# Patient Record
Sex: Male | Born: 1955
Health system: Southern US, Community
[De-identification: ages and names within clinical notes are randomized; demographics above are authoritative.]

## PROBLEM LIST (undated history)

## (undated) DIAGNOSIS — E785 Hyperlipidemia, unspecified: Secondary | ICD-10-CM

## (undated) DIAGNOSIS — T7840XA Allergy, unspecified, initial encounter: Secondary | ICD-10-CM

## (undated) DIAGNOSIS — I2510 Atherosclerotic heart disease of native coronary artery without angina pectoris: Secondary | ICD-10-CM

## (undated) DIAGNOSIS — K219 Gastro-esophageal reflux disease without esophagitis: Secondary | ICD-10-CM

## (undated) DIAGNOSIS — I10 Essential (primary) hypertension: Secondary | ICD-10-CM

## (undated) DIAGNOSIS — F419 Anxiety disorder, unspecified: Secondary | ICD-10-CM

## (undated) DIAGNOSIS — J45909 Unspecified asthma, uncomplicated: Secondary | ICD-10-CM

## (undated) DIAGNOSIS — I519 Heart disease, unspecified: Secondary | ICD-10-CM

## (undated) DIAGNOSIS — M1990 Unspecified osteoarthritis, unspecified site: Secondary | ICD-10-CM

## (undated) HISTORY — PX: ANKLE FRACTURE SURGERY: SHX122

## (undated) HISTORY — DX: Atherosclerotic heart disease of native coronary artery without angina pectoris: I25.10

## (undated) HISTORY — DX: Gastro-esophageal reflux disease without esophagitis: K21.9

## (undated) HISTORY — DX: Heart disease, unspecified: I51.9

## (undated) HISTORY — DX: Essential (primary) hypertension: I10

## (undated) HISTORY — DX: Hyperlipidemia, unspecified: E78.5

## (undated) HISTORY — DX: Unspecified osteoarthritis, unspecified site: M19.90

## (undated) HISTORY — DX: Anxiety disorder, unspecified: F41.9

## (undated) HISTORY — DX: Unspecified asthma, uncomplicated: J45.909

## (undated) HISTORY — DX: Allergy, unspecified, initial encounter: T78.40XA

## (undated) HISTORY — PX: CORONARY STENT PLACEMENT: SHX1402

## (undated) HISTORY — PX: FRACTURE SURGERY: SHX138

---

## 1979-03-17 DIAGNOSIS — E782 Mixed hyperlipidemia: Secondary | ICD-10-CM | POA: Insufficient documentation

## 2006-10-02 HISTORY — PX: CARDIAC CATHETERIZATION: SHX172

## 2007-08-01 DIAGNOSIS — I2510 Atherosclerotic heart disease of native coronary artery without angina pectoris: Secondary | ICD-10-CM | POA: Insufficient documentation

## 2008-09-17 DIAGNOSIS — F32A Depression, unspecified: Secondary | ICD-10-CM | POA: Insufficient documentation

## 2009-10-02 HISTORY — PX: CARDIAC CATHETERIZATION: SHX172

## 2011-09-04 DIAGNOSIS — S270XXA Traumatic pneumothorax, initial encounter: Secondary | ICD-10-CM

## 2011-09-04 DIAGNOSIS — Z8781 Personal history of (healed) traumatic fracture: Secondary | ICD-10-CM | POA: Insufficient documentation

## 2011-09-04 HISTORY — DX: Traumatic pneumothorax, initial encounter: S27.0XXA

## 2012-10-24 DIAGNOSIS — Z8249 Family history of ischemic heart disease and other diseases of the circulatory system: Secondary | ICD-10-CM | POA: Insufficient documentation

## 2012-12-10 DIAGNOSIS — E7800 Pure hypercholesterolemia, unspecified: Secondary | ICD-10-CM | POA: Insufficient documentation

## 2015-03-22 ENCOUNTER — Ambulatory Visit (INDEPENDENT_AMBULATORY_CARE_PROVIDER_SITE_OTHER): Payer: BLUE CROSS/BLUE SHIELD | Admitting: Family Medicine

## 2015-03-22 ENCOUNTER — Encounter: Payer: Self-pay | Admitting: Family Medicine

## 2015-03-22 VITALS — BP 124/74 | HR 82 | Temp 98.2°F | Ht 70.0 in | Wt 190.4 lb

## 2015-03-22 DIAGNOSIS — J4540 Moderate persistent asthma, uncomplicated: Secondary | ICD-10-CM | POA: Diagnosis not present

## 2015-03-22 DIAGNOSIS — K219 Gastro-esophageal reflux disease without esophagitis: Secondary | ICD-10-CM | POA: Insufficient documentation

## 2015-03-22 DIAGNOSIS — E785 Hyperlipidemia, unspecified: Secondary | ICD-10-CM | POA: Insufficient documentation

## 2015-03-22 DIAGNOSIS — J45909 Unspecified asthma, uncomplicated: Secondary | ICD-10-CM | POA: Insufficient documentation

## 2015-03-22 DIAGNOSIS — F419 Anxiety disorder, unspecified: Secondary | ICD-10-CM | POA: Insufficient documentation

## 2015-03-22 MED ORDER — FLUTICASONE-SALMETEROL 100-50 MCG/DOSE IN AEPB: 1.0000 | INHALATION_SPRAY | Freq: Every day | RESPIRATORY_TRACT | Status: DC

## 2015-03-22 MED ORDER — RABEPRAZOLE SODIUM 20 MG PO TBEC: 20.0000 mg | DELAYED_RELEASE_TABLET | Freq: Every day | ORAL | Status: DC

## 2015-03-22 NOTE — Progress Notes (Signed)
Name: Paul Page   MRN: [id number]    DOB: 1956/06/13   Date:03/22/2015       Progress Note  Subjective  Chief Complaint  Chief Complaint  Patient presents with  . Follow-up    medications    Hyperlipidemia This is a chronic problem. The problem is uncontrolled. Recent lipid tests were reviewed and are high. Pertinent negatives include no chest pain, leg pain or myalgias. Current antihyperlipidemic treatment includes statins. There are no compliance problems.   Heartburn He complains of heartburn. He reports no abdominal pain, no belching, no chest pain, no choking, no dysphagia, no nausea or no sore throat. This is a chronic problem. The problem has been rapidly improving. The symptoms are aggravated by certain foods. He has tried a PPI for the symptoms. The treatment provided significant relief. Past procedures do not include an EGD.      Past Medical History  Diagnosis Date  . Anxiety   . Hypertension   . Hyperlipidemia   . GERD (gastroesophageal reflux disease)   . Heart disease     Past Surgical History  Procedure Laterality Date  . Ankle fracture surgery    . Coronary stent placement      Family History  Problem Relation Age of Onset  . Parkinson's disease Mother   . Cancer Father     History   Social History  . Marital Status: Married    Spouse Name: N/A  . Number of Children: N/A  . Years of Education: N/A   Occupational History  . Not on file.   Social History Main Topics  . Smoking status: Never Smoker   . Smokeless tobacco: Not on file  . Alcohol Use: 0.0 oz/week    0 Standard drinks or equivalent per week     Comment: occasional wine  . Drug Use: No  . Sexual Activity: Not on file   Other Topics Concern  . Not on file   Social History Narrative  . No narrative on file     Current outpatient prescriptions:  .  albuterol (VENTOLIN HFA) 108 (90 BASE) MCG/ACT inhaler, Inhale into the lungs., Disp: , Rfl:  .  clopidogrel (PLAVIX) 75  MG tablet, Take by mouth., Disp: , Rfl:  .  escitalopram (LEXAPRO) 10 MG tablet, Take by mouth., Disp: , Rfl:  .  Nebivolol HCl (BYSTOLIC) 20 MG TABS, Take by mouth., Disp: , Rfl:  .  RABEprazole (ACIPHEX) 20 MG tablet, Take by mouth., Disp: , Rfl:  .  simvastatin (ZOCOR) 20 MG tablet, Take by mouth., Disp: , Rfl:   Allergies  Allergen Reactions  . Peanut Oil   . Tetracyclines & Related      Review of Systems  HENT: Negative for sore throat.   Respiratory: Negative for choking.   Cardiovascular: Negative for chest pain.  Gastrointestinal: Positive for heartburn. Negative for dysphagia, nausea and abdominal pain.  Musculoskeletal: Negative for myalgias.      Objective  Filed Vitals:   03/22/15 1013  BP: 124/74  Pulse: 82  Temp: 98.2 F (36.8 C)  TempSrc: Oral  Height:  (1.778 m)  Weight: 190 lb 6.4 oz (86.365 kg)  SpO2: 93%    Physical Exam  Constitutional: He is oriented to person, place, and time and well-developed, well-nourished, and in no distress.  HENT:  Head: Normocephalic and atraumatic.  Cardiovascular: Normal rate, regular rhythm and normal heart sounds.   Pulmonary/Chest: Effort normal and breath sounds normal.  Abdominal: Soft. Bowel sounds  are normal.  Neurological: He is alert and oriented to person, place, and time.  Nursing note and vitals reviewed.     No results found for this or any previous visit (from the past 2160 hour(s)).   Assessment & Plan 1. Dyslipidemia Patient is on simvastatin 20 mg at bedtime. His last fasting lipid panel showed elevated total cholesterol and elevated LDL cholesterol. We will repeat a fasting lipid panel today and follow-up on medication management. - Lipid Profile  2. Gastroesophageal reflux disease, esophagitis presence not specified Symptoms very well controlled on present PPI therapy. Continue present management. - RABEprazole (ACIPHEX) 20 MG tablet; Take 1 tablet (20 mg total) by mouth daily.   Dispense: 90 tablet; Refill: 1  3. Asthma, moderate persistent, uncomplicated Symptoms responsive to therapy. - Fluticasone-Salmeterol (ADVAIR) 100-50 MCG/DOSE AEPB; Inhale 1 puff into the lungs daily.  Dispense: 3 each; Refill: 0  There are no diagnoses linked to this encounter.  Anirudh Baiz Asad [facility] 03/22/2015 10:32 AM

## 2015-03-30 LAB — LIPID PANEL
Chol/HDL Ratio: 4.7 ratio units (ref 0.0–5.0)
Cholesterol, Total: 248 mg/dL — ABNORMAL HIGH (ref 100–199)
HDL: 53 mg/dL (ref >39)
LDL Calculated: 122 mg/dL — ABNORMAL HIGH (ref 0–99)
TRIGLYCERIDES: 363 mg/dL — AB (ref 0–149)
VLDL Cholesterol Cal: 73 mg/dL — ABNORMAL HIGH (ref 5–40)

## 2015-03-31 NOTE — Progress Notes (Signed)
Called Pt, advised of labs, asked him to call our schedulers and schedule an appt to come in to discuss different medications.

## 2015-04-12 ENCOUNTER — Ambulatory Visit (INDEPENDENT_AMBULATORY_CARE_PROVIDER_SITE_OTHER): Payer: BLUE CROSS/BLUE SHIELD | Admitting: Family Medicine

## 2015-04-12 ENCOUNTER — Encounter: Payer: Self-pay | Admitting: Family Medicine

## 2015-04-12 VITALS — BP 127/70 | HR 74 | Temp 97.9°F | Resp 17 | Ht 70.0 in | Wt 192.2 lb

## 2015-04-12 DIAGNOSIS — I10 Essential (primary) hypertension: Secondary | ICD-10-CM | POA: Insufficient documentation

## 2015-04-12 DIAGNOSIS — I519 Heart disease, unspecified: Secondary | ICD-10-CM

## 2015-04-12 DIAGNOSIS — F419 Anxiety disorder, unspecified: Secondary | ICD-10-CM

## 2015-04-12 DIAGNOSIS — E785 Hyperlipidemia, unspecified: Secondary | ICD-10-CM | POA: Diagnosis not present

## 2015-04-12 MED ORDER — ATORVASTATIN CALCIUM 40 MG PO TABS: 40.0000 mg | ORAL_TABLET | Freq: Every day | ORAL | Status: DC

## 2015-04-12 MED ORDER — ESCITALOPRAM OXALATE 10 MG PO TABS: 10.0000 mg | ORAL_TABLET | Freq: Every day | ORAL | Status: DC

## 2015-04-12 MED ORDER — BYSTOLIC 5 MG PO TABS: 5.0000 mg | ORAL_TABLET | Freq: Every day | ORAL | Status: DC

## 2015-04-12 MED ORDER — CLOPIDOGREL BISULFATE 75 MG PO TABS: 75.0000 mg | ORAL_TABLET | Freq: Every day | ORAL | Status: DC

## 2015-04-12 NOTE — Progress Notes (Signed)
Name: Paul Page   MRN: [id number]    DOB: 1956/05/10   Date:04/12/2015       Progress Note  Subjective  Chief Complaint  Chief Complaint  Patient presents with  . Follow-up    Test Results/refills  . Hypertension    Hypertension This is a chronic problem. The problem is unchanged. The problem is controlled. Pertinent negatives include no shortness of breath. Past treatments include beta blockers. Hypertensive end-organ damage includes CAD/MI. There is no history of CVA.  Hyperlipidemia This is a chronic problem. Recent lipid tests were reviewed and are high. He has no history of diabetes or liver disease. Pertinent negatives include no leg pain, myalgias or shortness of breath. Current antihyperlipidemic treatment includes statins. The current treatment provides no improvement of lipids. Risk factors for coronary artery disease include dyslipidemia and male sex.      Past Medical History  Diagnosis Date  . Anxiety   . Hypertension   . Hyperlipidemia   . GERD (gastroesophageal reflux disease)   . Heart disease     Past Surgical History  Procedure Laterality Date  . Ankle fracture surgery    . Coronary stent placement      Family History  Problem Relation Age of Onset  . Parkinson's disease Mother   . Cancer Father     History   Social History  . Marital Status: Married    Spouse Name: N/A  . Number of Children: N/A  . Years of Education: N/A   Occupational History  . Not on file.   Social History Main Topics  . Smoking status: Never Smoker   . Smokeless tobacco: Never Used  . Alcohol Use: 0.0 oz/week    0 Standard drinks or equivalent per week     Comment: occasional wine  . Drug Use: No  . Sexual Activity: Yes   Other Topics Concern  . Not on file   Social History Narrative     Current outpatient prescriptions:  .  albuterol (VENTOLIN HFA) 108 (90 BASE) MCG/ACT inhaler, Inhale into the lungs., Disp: , Rfl:  .  BYSTOLIC 5 MG tablet, Take 1  tablet by mouth daily., Disp: , Rfl: 1 .  clopidogrel (PLAVIX) 75 MG tablet, Take by mouth., Disp: , Rfl:  .  escitalopram (LEXAPRO) 10 MG tablet, Take by mouth., Disp: , Rfl:  .  Fluticasone-Salmeterol (ADVAIR) 100-50 MCG/DOSE AEPB, Inhale 1 puff into the lungs daily., Disp: 3 each, Rfl: 0 .  MULTIPLE VITAMIN PO, Take by mouth., Disp: , Rfl:  .  Nebivolol HCl (BYSTOLIC) 20 MG TABS, Take by mouth., Disp: , Rfl:  .  RABEprazole (ACIPHEX) 20 MG tablet, Take 1 tablet (20 mg total) by mouth daily., Disp: 90 tablet, Rfl: 1  Allergies  Allergen Reactions  . Peanut Oil   . Tetracyclines & Related      Review of Systems  Respiratory: Negative for shortness of breath.   Musculoskeletal: Negative for myalgias.      Objective  Filed Vitals:   04/12/15 1135  BP: 127/70  Pulse: 74  Temp: 97.9 F (36.6 C)  TempSrc: Oral  Resp: 17  Height:  (1.778 m)  Weight: 192 lb 3.2 oz (87.181 kg)  SpO2: 95%    Physical Exam  Constitutional: He is oriented to person, place, and time and well-developed, well-nourished, and in no distress.  HENT:  Head: Normocephalic and atraumatic.  Cardiovascular: Normal rate and regular rhythm.   Pulmonary/Chest: Effort normal and breath sounds normal.  Neurological: He is alert and oriented to person, place, and time.  Skin: Skin is warm and dry.  Psychiatric: Memory, affect and judgment normal.  Nursing note and vitals reviewed.     Assessment & Plan 1. Anxiety  - escitalopram (LEXAPRO) 10 MG tablet; Take 1 tablet (10 mg total) by mouth daily.  Dispense: 90 tablet; Refill: 3  2. Dyslipidemia Fasting lipid panel reviewed with patient. He will be switched to Lipitor 40 mg at bedtime. Recheck in 3-4 months. - atorvastatin (LIPITOR) 40 MG tablet; Take 1 tablet (40 mg total) by mouth daily.  Dispense: 90 tablet; Refill: 0  3. Heart disease  - clopidogrel (PLAVIX) 75 MG tablet; Take 1 tablet (75 mg total) by mouth daily.  Dispense: 90 tablet;  Refill: 3  4. Essential hypertension  - BYSTOLIC 5 MG tablet; Take 1 tablet (5 mg total) by mouth daily.  Dispense: 30 tablet; Refill: 2   Emylie Amster Asad [facility] 04/12/2015 12:08 PM

## 2015-07-13 ENCOUNTER — Encounter: Payer: Self-pay | Admitting: Family Medicine

## 2015-07-13 ENCOUNTER — Ambulatory Visit (INDEPENDENT_AMBULATORY_CARE_PROVIDER_SITE_OTHER): Payer: BLUE CROSS/BLUE SHIELD | Admitting: Family Medicine

## 2015-07-13 VITALS — BP 128/69 | HR 86 | Temp 98.8°F | Resp 18 | Ht 70.0 in | Wt 195.4 lb

## 2015-07-13 DIAGNOSIS — J4540 Moderate persistent asthma, uncomplicated: Secondary | ICD-10-CM

## 2015-07-13 DIAGNOSIS — E785 Hyperlipidemia, unspecified: Secondary | ICD-10-CM | POA: Diagnosis not present

## 2015-07-13 DIAGNOSIS — K219 Gastro-esophageal reflux disease without esophagitis: Secondary | ICD-10-CM | POA: Diagnosis not present

## 2015-07-13 MED ORDER — RABEPRAZOLE SODIUM 20 MG PO TBEC: 20.0000 mg | DELAYED_RELEASE_TABLET | Freq: Every day | ORAL | Status: DC

## 2015-07-13 MED ORDER — ALBUTEROL SULFATE HFA 108 (90 BASE) MCG/ACT IN AERS: 2.0000 | INHALATION_SPRAY | Freq: Four times a day (QID) | RESPIRATORY_TRACT | Status: DC | PRN

## 2015-07-13 MED ORDER — FLUTICASONE-SALMETEROL 100-50 MCG/DOSE IN AEPB: 1.0000 | INHALATION_SPRAY | Freq: Every day | RESPIRATORY_TRACT | Status: DC

## 2015-07-13 MED ORDER — ATORVASTATIN CALCIUM 40 MG PO TABS: 40.0000 mg | ORAL_TABLET | Freq: Every day | ORAL | Status: DC

## 2015-07-13 NOTE — Progress Notes (Signed)
Name: Paul Page   MRN: [id number]    DOB: 01/26/1956   Date:07/13/2015       Progress Note  Subjective  Chief Complaint  Chief Complaint  Patient presents with  . Follow-up    3 mo  . Medication Refill  . Hypertension  . Hyperlipidemia   Hyperlipidemia This is a chronic problem. The problem is uncontrolled. Recent lipid tests were reviewed and are high. Pertinent negatives include no chest pain, leg pain, myalgias or shortness of breath. Current antihyperlipidemic treatment includes statins.  Gastrophageal Reflux He reports no chest pain, no coughing, no dysphagia, no heartburn, no nausea or no wheezing. This is a chronic problem. He has tried a PPI for the symptoms. The treatment provided moderate relief.  Asthma He complains of chest tightness. There is no cough, difficulty breathing, shortness of breath or wheezing. Pertinent negatives include no chest pain, heartburn or myalgias. His symptoms are aggravated by change in weather. His symptoms are alleviated by beta-agonist and steroid inhaler. He reports significant improvement on treatment. His past medical history is significant for asthma. There is no history of COPD.   Past Medical History  Diagnosis Date  . Anxiety   . Hypertension   . Hyperlipidemia   . GERD (gastroesophageal reflux disease)   . Heart disease     Past Surgical History  Procedure Laterality Date  . Ankle fracture surgery    . Coronary stent placement      Family History  Problem Relation Age of Onset  . Parkinson's disease Mother   . Cancer Father     Social History   Social History  . Marital Status: Married    Spouse Name: N/A  . Number of Children: N/A  . Years of Education: N/A   Occupational History  . Not on file.   Social History Main Topics  . Smoking status: Never Smoker   . Smokeless tobacco: Never Used  . Alcohol Use: 0.0 oz/week    0 Standard drinks or equivalent per week     Comment: occasional wine  . Drug Use:  No  . Sexual Activity: Yes   Other Topics Concern  . Not on file   Social History Narrative    Current outpatient prescriptions:  .  albuterol (VENTOLIN HFA) 108 (90 BASE) MCG/ACT inhaler, Inhale into the lungs., Disp: , Rfl:  .  atorvastatin (LIPITOR) 40 MG tablet, Take 1 tablet (40 mg total) by mouth daily., Disp: 90 tablet, Rfl: 0 .  BYSTOLIC 5 MG tablet, Take 1 tablet (5 mg total) by mouth daily., Disp: 30 tablet, Rfl: 2 .  clopidogrel (PLAVIX) 75 MG tablet, Take 1 tablet (75 mg total) by mouth daily., Disp: 90 tablet, Rfl: 3 .  escitalopram (LEXAPRO) 10 MG tablet, Take 1 tablet (10 mg total) by mouth daily., Disp: 90 tablet, Rfl: 3 .  Fluticasone-Salmeterol (ADVAIR) 100-50 MCG/DOSE AEPB, Inhale 1 puff into the lungs daily., Disp: 3 each, Rfl: 0 .  MULTIPLE VITAMIN PO, Take by mouth., Disp: , Rfl:  .  RABEprazole (ACIPHEX) 20 MG tablet, Take 1 tablet (20 mg total) by mouth daily., Disp: 90 tablet, Rfl: 1  Allergies  Allergen Reactions  . Peanut Oil   . Tetracyclines & Related    Review of Systems  Respiratory: Negative for cough, shortness of breath and wheezing.   Cardiovascular: Negative for chest pain and palpitations.  Gastrointestinal: Negative for heartburn, dysphagia and nausea.  Musculoskeletal: Negative for myalgias.   Objective  Filed Vitals:  07/13/15 1635  BP: 128/69  Pulse: 86  Temp: 98.8 F (37.1 C)  TempSrc: Oral  Resp: 18  Height:  (1.778 m)  Weight: 195 lb 6.4 oz (88.633 kg)  SpO2: 95%   Physical Exam  Constitutional: He is oriented to person, place, and time and well-developed, well-nourished, and in no distress.  Cardiovascular: Normal rate and regular rhythm.   Pulmonary/Chest: Effort normal and breath sounds normal.  Abdominal: Soft. Bowel sounds are normal.  Neurological: He is alert and oriented to person, place, and time.  Nursing note and vitals reviewed.  Assessment & Plan  1. Dyslipidemia  FLP was not at goal in June 2016.  Patient switched to higher intensity statin therapy. Recheck today and follow-up. - Lipid Profile - Comprehensive Metabolic Panel (CMET) - atorvastatin (LIPITOR) 40 MG tablet; Take 1 tablet (40 mg total) by mouth daily.  Dispense: 90 tablet; Refill: 0  2. Gastroesophageal reflux disease, esophagitis presence not specified  - RABEprazole (ACIPHEX) 20 MG tablet; Take 1 tablet (20 mg total) by mouth daily.  Dispense: 90 tablet; Refill: 1  3. Asthma, moderate persistent, uncomplicated  - Fluticasone-Salmeterol (ADVAIR) 100-50 MCG/DOSE AEPB; Inhale 1 puff into the lungs daily.  Dispense: 3 each; Refill: 0 - albuterol (VENTOLIN HFA) 108 (90 BASE) MCG/ACT inhaler; Inhale 2 puffs into the lungs every 6 (six) hours as needed for wheezing or shortness of breath.  Dispense: 1 Inhaler; Refill: 2   Nery Kalisz [facility]ad [facility] 07/13/2015 4:43 PM

## 2015-07-26 ENCOUNTER — Telehealth: Payer: Self-pay | Admitting: Family Medicine

## 2015-07-26 NOTE — Telephone Encounter (Signed)
Patient was seen earlier this month and the doctor forgot to refill his Bystolic 5 mg. [facility] Patient is completely out.

## 2015-07-27 ENCOUNTER — Other Ambulatory Visit: Payer: Self-pay

## 2015-07-27 DIAGNOSIS — I10 Essential (primary) hypertension: Secondary | ICD-10-CM

## 2015-07-27 MED ORDER — BYSTOLIC 5 MG PO TABS: 5.0000 mg | ORAL_TABLET | Freq: Every day | ORAL | Status: DC

## 2015-07-27 NOTE — Telephone Encounter (Signed)
Patient requesting refill. 

## 2015-07-29 NOTE — Telephone Encounter (Signed)
Medication was refilled by Dr. Sherryll BurgerShah on 07/27/2015 and sent to [facility]

## 2015-08-20 ENCOUNTER — Telehealth: Payer: Self-pay | Admitting: Family Medicine

## 2015-08-20 NOTE — Telephone Encounter (Signed)
Pt is requesting refill Simvastatin 40 mg. Pt took himself off of the Lipitor due to kidney stones. [facility] [address]

## 2015-08-24 NOTE — Telephone Encounter (Signed)
Routed to Dr. Shah for medication advice 

## 2015-08-24 NOTE — Telephone Encounter (Signed)
Pts wife called again and he is down to 4 pills. She wants to be sure he gets a refill before the holiday.

## 2015-08-24 NOTE — Telephone Encounter (Signed)
Changing Lipitor to simvastatin will not be appropriate. Patient had elevated cholesterol and triglycerides. He needs an office visit appointment to discuss change in therapy and obtain FLP.

## 2015-08-31 NOTE — Telephone Encounter (Signed)
LMOM  To get pt to call the office back in regards to an appt.

## 2015-09-06 ENCOUNTER — Ambulatory Visit: Payer: BLUE CROSS/BLUE SHIELD | Admitting: Family Medicine

## 2015-10-02 LAB — COMPREHENSIVE METABOLIC PANEL
ALK PHOS: 78 IU/L (ref 39–117)
ALT: 32 IU/L (ref 0–44)
AST: 30 IU/L (ref 0–40)
Albumin/Globulin Ratio: 1.8 (ref 1.1–2.5)
Albumin: 4.4 g/dL (ref 3.5–5.5)
BILIRUBIN TOTAL: 0.7 mg/dL (ref 0.0–1.2)
BUN / CREAT RATIO: 20 (ref 9–20)
BUN: 20 mg/dL (ref 6–24)
CHLORIDE: 100 mmol/L (ref 96–106)
CO2: 26 mmol/L (ref 18–29)
Calcium: 9.6 mg/dL (ref 8.7–10.2)
Creatinine, Ser: 0.99 mg/dL (ref 0.76–1.27)
GFR calc Af Amer: 96 mL/min/{1.73_m2} (ref >59)
GFR calc non Af Amer: 83 mL/min/{1.73_m2} (ref >59)
GLUCOSE: 98 mg/dL (ref 65–99)
Globulin, Total: 2.5 g/dL (ref 1.5–4.5)
POTASSIUM: 4.4 mmol/L (ref 3.5–5.2)
SODIUM: 141 mmol/L (ref 134–144)
Total Protein: 6.9 g/dL (ref 6.0–8.5)

## 2015-10-02 LAB — LIPID PANEL
Chol/HDL Ratio: 3.5 ratio units (ref 0.0–5.0)
Cholesterol, Total: 195 mg/dL (ref 100–199)
HDL: 55 mg/dL (ref >39)
LDL Calculated: 96 mg/dL (ref 0–99)
Triglycerides: 220 mg/dL — ABNORMAL HIGH (ref 0–149)
VLDL Cholesterol Cal: 44 mg/dL — ABNORMAL HIGH (ref 5–40)

## 2015-10-06 ENCOUNTER — Telehealth: Payer: Self-pay

## 2015-10-06 DIAGNOSIS — E785 Hyperlipidemia, unspecified: Secondary | ICD-10-CM

## 2015-10-06 MED ORDER — ATORVASTATIN CALCIUM 40 MG PO TABS: 40.0000 mg | ORAL_TABLET | Freq: Every day | ORAL | Status: DC

## 2015-10-06 NOTE — Telephone Encounter (Signed)
Medication has been refilled and [facility] 

## 2015-10-22 ENCOUNTER — Ambulatory Visit: Payer: BLUE CROSS/BLUE SHIELD | Admitting: Family Medicine

## 2015-10-23 ENCOUNTER — Other Ambulatory Visit: Payer: Self-pay | Admitting: Family Medicine

## 2015-10-27 NOTE — Telephone Encounter (Signed)
Medication has been refilled and [facility] 

## 2015-11-18 ENCOUNTER — Telehealth: Payer: Self-pay

## 2015-11-18 DIAGNOSIS — I10 Essential (primary) hypertension: Secondary | ICD-10-CM

## 2015-11-18 MED ORDER — BYSTOLIC 5 MG PO TABS: 5.0000 mg | ORAL_TABLET | Freq: Every day | ORAL | Status: DC

## 2015-11-18 NOTE — Telephone Encounter (Signed)
Medication has been [facility] and [facility] [address]

## 2015-12-02 ENCOUNTER — Encounter: Payer: Self-pay | Admitting: Family Medicine

## 2015-12-02 ENCOUNTER — Ambulatory Visit (INDEPENDENT_AMBULATORY_CARE_PROVIDER_SITE_OTHER): Payer: BLUE CROSS/BLUE SHIELD | Admitting: Family Medicine

## 2015-12-02 VITALS — BP 128/70 | HR 81 | Temp 98.4°F | Resp 17 | Ht 70.0 in | Wt 199.3 lb

## 2015-12-02 DIAGNOSIS — I10 Essential (primary) hypertension: Secondary | ICD-10-CM

## 2015-12-02 DIAGNOSIS — I2510 Atherosclerotic heart disease of native coronary artery without angina pectoris: Secondary | ICD-10-CM | POA: Diagnosis not present

## 2015-12-02 DIAGNOSIS — E785 Hyperlipidemia, unspecified: Secondary | ICD-10-CM | POA: Diagnosis not present

## 2015-12-02 MED ORDER — BYSTOLIC 5 MG PO TABS: 5.0000 mg | ORAL_TABLET | Freq: Every day | ORAL | Status: DC

## 2015-12-02 MED ORDER — ATORVASTATIN CALCIUM 40 MG PO TABS: 40.0000 mg | ORAL_TABLET | Freq: Every day | ORAL | Status: DC

## 2015-12-02 NOTE — Progress Notes (Signed)
Name: Paul Page   MRN: [id number]    DOB: December 14, 1955   Date:12/02/2015       Progress Note  Subjective  Chief Complaint  Chief Complaint  Patient presents with  . Medication Refill    Hypertension This is a chronic problem. The problem is controlled. Pertinent negatives include no chest pain, headaches, palpitations or shortness of breath. Past treatments include beta blockers.  Hyperlipidemia This is a chronic problem. The problem is controlled. Recent lipid tests were reviewed and are normal. Pertinent negatives include no chest pain, myalgias or shortness of breath. Current antihyperlipidemic treatment includes statins.   Coronary Artery Disease History of Coronary Artery Disease, has 5 stents put in [facility]. Used to follow up with Dr. Derrell Lolling in [place]. He is on Plavix. Needs a referral to a Personal assistant (preferably in [place]).   Past Medical History  Diagnosis Date  . Anxiety   . Hypertension   . Hyperlipidemia   . GERD (gastroesophageal reflux disease)   . Heart disease     Past Surgical History  Procedure Laterality Date  . Ankle fracture surgery    . Coronary stent placement      Family History  Problem Relation Age of Onset  . Parkinson's disease Mother   . Cancer Father     Social History   Social History  . Marital Status: Married    Spouse Name: N/A  . Number of Children: N/A  . Years of Education: N/A   Occupational History  . Not on file.   Social History Main Topics  . Smoking status: Never Smoker   . Smokeless tobacco: Never Used  . Alcohol Use: 0.0 oz/week    0 Standard drinks or equivalent per week     Comment: occasional wine  . Drug Use: No  . Sexual Activity: Yes   Other Topics Concern  . Not on file   Social History Narrative     Current outpatient prescriptions:  .  albuterol (VENTOLIN HFA) 108 (90 BASE) MCG/ACT inhaler, Inhale 2 puffs into the lungs every 6 (six) hours as needed for wheezing or shortness of  breath., Disp: 1 Inhaler, Rfl: 2 .  atorvastatin (LIPITOR) 40 MG tablet, Take 1 tablet (40 mg total) by mouth daily., Disp: 90 tablet, Rfl: 0 .  B Complex CAPS, Take 1 capsule by mouth daily., Disp: , Rfl:  .  BYSTOLIC 5 MG tablet, Take 1 tablet (5 mg total) by mouth daily., Disp: 30 tablet, Rfl: 0 .  clopidogrel (PLAVIX) 75 MG tablet, Take 1 tablet (75 mg total) by mouth daily., Disp: 90 tablet, Rfl: 3 .  escitalopram (LEXAPRO) 10 MG tablet, Take 1 tablet (10 mg total) by mouth daily., Disp: 90 tablet, Rfl: 3 .  Fluticasone-Salmeterol (ADVAIR DISKUS) 100-50 MCG/DOSE AEPB, Inhale 1 puff into the lungs daily., Disp: 180 each, Rfl: 0 .  MULTIPLE VITAMIN PO, Take by mouth., Disp: , Rfl:  .  Multiple Vitamins-Minerals (CENTRUM SILVER PO), Take 1 tablet by mouth daily., Disp: , Rfl:  .  RABEprazole (ACIPHEX) 20 MG tablet, Take 1 tablet (20 mg total) by mouth daily., Disp: 90 tablet, Rfl: 1  Allergies  Allergen Reactions  . Peanut Oil   . Tetracyclines & Related      Review of Systems  Respiratory: Negative for shortness of breath.   Cardiovascular: Negative for chest pain and palpitations.  Musculoskeletal: Negative for myalgias and joint pain.  Neurological: Negative for headaches.     Objective  Filed Vitals:  12/02/15 0852  BP: 128/70  Pulse: 81  Temp: 98.4 F (36.9 C)  TempSrc: Oral  Resp: 17  Height:  (1.778 m)  Weight: 199 lb 4.8 oz (90.402 kg)  SpO2: 95%    Physical Exam  Constitutional: He is oriented to person, place, and time and well-developed, well-nourished, and in no distress.  HENT:  Head: Normocephalic and atraumatic.  Cardiovascular: Normal rate and regular rhythm.   Pulmonary/Chest: Effort normal and breath sounds normal.  Abdominal: Soft. Bowel sounds are normal.  Neurological: He is alert and oriented to person, place, and time.  Nursing note and vitals reviewed.     Assessment & Plan  1. Coronary artery disease involving native coronary  artery of native heart without angina pectoris Patient will call back to provide the contact information for cardiologist. - Ambulatory referral to Cardiology  2. Essential hypertension BP stable and at goal on beta blocker therapy. - BYSTOLIC 5 MG tablet; Take 1 tablet (5 mg total) by mouth daily.  Dispense: 90 tablet; Refill: 1  3. Dyslipidemia Lipids significantly improved with statin therapy. Recheck in 3 months. - atorvastatin (LIPITOR) 40 MG tablet; Take 1 tablet (40 mg total) by mouth daily.  Dispense: 90 tablet; Refill: 1   Bonnie Roig Asad [facility] 12/02/2015 9:22 AM

## 2015-12-04 ENCOUNTER — Emergency Department (HOSPITAL_COMMUNITY): Payer: BLUE CROSS/BLUE SHIELD

## 2015-12-04 ENCOUNTER — Emergency Department (HOSPITAL_COMMUNITY)
Admission: EM | Admit: 2015-12-04 | Discharge: 2015-12-04 | Disposition: A | Discharge status: Home / Self Care | Payer: BLUE CROSS/BLUE SHIELD | Attending: Emergency Medicine | Admitting: Emergency Medicine

## 2015-12-04 ENCOUNTER — Encounter (HOSPITAL_COMMUNITY): Payer: Self-pay | Admitting: Family Medicine

## 2015-12-04 DIAGNOSIS — I10 Essential (primary) hypertension: Secondary | ICD-10-CM | POA: Diagnosis not present

## 2015-12-04 DIAGNOSIS — R109 Unspecified abdominal pain: Secondary | ICD-10-CM

## 2015-12-04 DIAGNOSIS — F419 Anxiety disorder, unspecified: Secondary | ICD-10-CM | POA: Diagnosis not present

## 2015-12-04 DIAGNOSIS — R197 Diarrhea, unspecified: Secondary | ICD-10-CM | POA: Diagnosis present

## 2015-12-04 DIAGNOSIS — Z7902 Long term (current) use of antithrombotics/antiplatelets: Secondary | ICD-10-CM | POA: Insufficient documentation

## 2015-12-04 DIAGNOSIS — Z79899 Other long term (current) drug therapy: Secondary | ICD-10-CM | POA: Diagnosis not present

## 2015-12-04 DIAGNOSIS — K219 Gastro-esophageal reflux disease without esophagitis: Secondary | ICD-10-CM | POA: Diagnosis not present

## 2015-12-04 DIAGNOSIS — Z7951 Long term (current) use of inhaled steroids: Secondary | ICD-10-CM | POA: Insufficient documentation

## 2015-12-04 DIAGNOSIS — E785 Hyperlipidemia, unspecified: Secondary | ICD-10-CM | POA: Diagnosis not present

## 2015-12-04 LAB — URINALYSIS, ROUTINE W REFLEX MICROSCOPIC
Glucose, UA: NEGATIVE mg/dL
Ketones, ur: 15 mg/dL — AB
Leukocytes, UA: NEGATIVE
Nitrite: NEGATIVE
PH: 5.5 (ref 5.0–8.0)
Protein, ur: 30 mg/dL — AB
SPECIFIC GRAVITY, URINE: 1.027 (ref 1.005–1.030)

## 2015-12-04 LAB — URINE MICROSCOPIC-ADD ON
Bacteria, UA: NONE SEEN
Squamous Epithelial / LPF: NONE SEEN

## 2015-12-04 LAB — COMPREHENSIVE METABOLIC PANEL
ALBUMIN: 4.3 g/dL (ref 3.5–5.0)
ALK PHOS: 83 U/L (ref 38–126)
ALT: 28 U/L (ref 17–63)
ANION GAP: 11 (ref 5–15)
AST: 29 U/L (ref 15–41)
BUN: 16 mg/dL (ref 6–20)
CO2: 24 mmol/L (ref 22–32)
Calcium: 9.7 mg/dL (ref 8.9–10.3)
Chloride: 101 mmol/L (ref 101–111)
Creatinine, Ser: 1.33 mg/dL — ABNORMAL HIGH (ref 0.61–1.24)
GFR calc Af Amer: >60 mL/min (ref >60)
GFR calc non Af Amer: 57 mL/min — ABNORMAL LOW (ref >60)
GLUCOSE: 131 mg/dL — AB (ref 65–99)
POTASSIUM: 4.3 mmol/L (ref 3.5–5.1)
SODIUM: 136 mmol/L (ref 135–145)
Total Bilirubin: 1.2 mg/dL (ref 0.3–1.2)
Total Protein: 8.4 g/dL — ABNORMAL HIGH (ref 6.5–8.1)

## 2015-12-04 LAB — CBC
HEMATOCRIT: 43.5 % (ref 39.0–52.0)
HEMOGLOBIN: 15 g/dL (ref 13.0–17.0)
MCH: 31.2 pg (ref 26.0–34.0)
MCHC: 34.5 g/dL (ref 30.0–36.0)
MCV: 90.4 fL (ref 78.0–100.0)
Platelets: 250 10*3/uL (ref 150–400)
RBC: 4.81 MIL/uL (ref 4.22–5.81)
RDW: 12.5 % (ref 11.5–15.5)
WBC: 8.8 10*3/uL (ref 4.0–10.5)

## 2015-12-04 LAB — LIPASE, BLOOD: Lipase: 22 U/L (ref 11–51)

## 2015-12-04 MED ORDER — SODIUM CHLORIDE 0.9 % IV BOLUS (SEPSIS)
1000.0000 mL | Freq: Once | INTRAVENOUS | Status: AC
  Administered 2015-12-04: 1000 mL via INTRAVENOUS

## 2015-12-04 MED ORDER — ONDANSETRON HCL 4 MG/2ML IJ SOLN
4.0000 mg | Freq: Once | INTRAMUSCULAR | Status: AC
  Administered 2015-12-04: 4 mg via INTRAVENOUS
  Filled 2015-12-04: qty 2

## 2015-12-04 MED ORDER — HYDROMORPHONE HCL 1 MG/ML IJ SOLN
1.0000 mg | Freq: Once | INTRAMUSCULAR | Status: AC
  Administered 2015-12-04: 1 mg via INTRAVENOUS
  Filled 2015-12-04: qty 1

## 2015-12-04 MED ORDER — HYDROCODONE-ACETAMINOPHEN 5-325 MG PO TABS: 1.0000 | ORAL_TABLET | Freq: Four times a day (QID) | ORAL | Status: DC | PRN

## 2015-12-04 NOTE — ED Notes (Signed)
Ambulated pt. To the ED waiting room, Gait steady, Pt.s wife pulled car to the front.

## 2015-12-04 NOTE — ED Notes (Signed)
Pt here for severe diarrhea x a few days. sts also right flank pain with hx of stones. sts dehydrated.

## 2015-12-04 NOTE — ED Notes (Signed)
Pt. Drank water and has tolerated thus far, no diarrhea

## 2015-12-04 NOTE — ED Provider Notes (Signed)
CSN: [id number]     Arrival date & time 12/04/15  0746 History   First MD Initiated Contact with Patient 12/04/15 0830     Chief Complaint  Patient presents with  . Diarrhea  . Flank Pain     Patient is a 60 y.o. male presenting with diarrhea and flank pain. The history is provided by the patient. No language interpreter was used.  Diarrhea Flank Pain   Paul Page is a 60 y.o. male who presents to the Emergency Department complaining of diarrhea, flank pain.  He reports 2 days of profuse watery diarrhea. He had a fever to 101 two days ago. He denies any abdominal pain. No vomiting or nausea. Any time he takes oral liquids he has immediate diarrhea. His grandson was sick with similar symptoms 2 days ago. No recent antibiotic use. This morning about 5:30 he developed sudden onset right flank pain that at times rates to his groin. No associated dysuria. The symptoms are similar to prior kidney stone.  Past Medical History  Diagnosis Date  . Anxiety   . Hypertension   . Hyperlipidemia   . GERD (gastroesophageal reflux disease)   . Heart disease    Past Surgical History  Procedure Laterality Date  . Ankle fracture surgery    . Coronary stent placement     Family History  Problem Relation Age of Onset  . Parkinson's disease Mother   . Cancer Father    Social History  Substance Use Topics  . Smoking status: Never Smoker   . Smokeless tobacco: Never Used  . Alcohol Use: 0.0 oz/week    0 Standard drinks or equivalent per week     Comment: occasional wine    Review of Systems  Gastrointestinal: Positive for diarrhea.  Genitourinary: Positive for flank pain.  All other systems reviewed and are negative.     Allergies  Peanut oil and Tetracyclines & related  Home Medications   Prior to Admission medications   Medication Sig Start Date End Date Taking? Authorizing Provider  acetaminophen (TYLENOL) 500 MG tablet Take 500 mg by mouth every 6 (six) hours as needed for  mild pain.   Yes Historical Provider, MD  albuterol (VENTOLIN HFA) 108 (90 BASE) MCG/ACT inhaler Inhale 2 puffs into the lungs every 6 (six) hours as needed for wheezing or shortness of breath. 07/13/15  Yes Ellyn Hack, MD  atorvastatin (LIPITOR) 40 MG tablet Take 1 tablet (40 mg total) by mouth daily. 12/02/15  Yes Ellyn Hack, MD  B Complex CAPS Take 1 capsule by mouth daily. 11/16/12  Yes Historical Provider, MD  BYSTOLIC 5 MG tablet Take 1 tablet (5 mg total) by mouth daily. 12/02/15  Yes Ellyn Hack, MD  clopidogrel (PLAVIX) 75 MG tablet Take 1 tablet (75 mg total) by mouth daily. 04/12/15  Yes Ellyn Hack, MD  escitalopram (LEXAPRO) 10 MG tablet Take 1 tablet (10 mg total) by mouth daily. 04/12/15  Yes Ellyn Hack, MD  Fluticasone-Salmeterol (ADVAIR DISKUS) 100-50 MCG/DOSE AEPB Inhale 1 puff into the lungs daily. 10/27/15  Yes Ellyn Hack, MD  Multiple Vitamins-Minerals (CENTRUM SILVER PO) Take 1 tablet by mouth daily. 01/14/1978  Yes Historical Provider, MD  RABEprazole (ACIPHEX) 20 MG tablet Take 1 tablet (20 mg total) by mouth daily. 07/13/15  Yes Ellyn Hack, MD  HYDROcodone-acetaminophen (NORCO/VICODIN) 5-325 MG tablet Take 1 tablet by mouth every 6 (six) hours as needed. 12/04/15  Tilden FossaElizabeth Ayari Liwanag, MD   BP 131/75 mmHg  Pulse 85  Temp(Src) 97.8 F (36.6 C) (Oral)  Resp 18  SpO2 95% Physical Exam  Constitutional: He is oriented to person, place, and time. He appears well-developed and well-nourished.  HENT:  Head: Normocephalic and atraumatic.  Cardiovascular: Normal rate and regular rhythm.   No murmur heard. Pulmonary/Chest: Effort normal and breath sounds normal. No respiratory distress.  Abdominal: Soft. There is no tenderness. There is no rebound and no guarding.  Musculoskeletal: He exhibits no edema or tenderness.  Neurological: He is alert and oriented to person, place, and time.  Skin: Skin is warm and dry.  Psychiatric: He has a normal mood and  affect. His behavior is normal.  Nursing note and vitals reviewed.   ED Course  Procedures (including critical care time) Labs Review Labs Reviewed  COMPREHENSIVE METABOLIC PANEL - Abnormal; Notable for the following:    Glucose, Bld 131 (*)    Creatinine, Ser 1.33 (*)    Total Protein 8.4 (*)    GFR calc non Af Amer 57 (*)    All other components within normal limits  URINALYSIS, ROUTINE W REFLEX MICROSCOPIC (NOT AT [facility]) - Abnormal; Notable for the following:    Color, Urine AMBER (*)    Hgb urine dipstick LARGE (*)    Bilirubin Urine SMALL (*)    Ketones, ur 15 (*)    Protein, ur 30 (*)    All other components within normal limits  LIPASE, BLOOD  CBC  URINE MICROSCOPIC-ADD ON    Imaging Review Ct Renal Stone Study  12/04/2015  CLINICAL DATA:  Right-sided flank pain. History of renal stones. Diarrhea for the past 2 days. EXAM: CT ABDOMEN AND PELVIS WITHOUT CONTRAST TECHNIQUE: Multidetector CT imaging of the abdomen and pelvis was performed following the standard protocol without IV contrast. COMPARISON:  None. FINDINGS: The lack of intravenous contrast limits the ability to evaluate solid abdominal organs. There is a punctate (approximately 3 mm) nonobstructing stone within the inferior pole the left kidney (axial image 39, series 2). No right-sided renal stones. No renal stones are seen along the expected course of either ureter or the urinary bladder. Normal noncontrast appearance of the urinary bladder given degree distention. There is a minimal amount of likely age and body habitus related bilateral perinephric stranding. No urinary obstruction. Normal hepatic contour. Normal noncontrast appearance of the gallbladder given degree distention. No radiopaque gallstones. No ascites. Normal noncontrast appearance of the bilateral adrenal glands, pancreas and spleen. The bowel is normal in course and caliber without discrete area of wall thickening on this noncontrast examination. Mild  lipomatosis hypertrophy of the ileocecal valve is suspected. Otherwise, normal noncontrast appearance of the terminal ileum. Normal noncontrast appearance of the retrocecal appendix. No pneumoperitoneum, pneumatosis or portal venous gas. Scattered atherosclerotic plaque within a normal caliber abdominal aorta. No bulky retroperitoneal, mesenteric, pelvic or inguinal lymphadenopathy on this noncontrast examination. Normal noncontrast appearance of the prostate. No free fluid in the pelvic cul-de-sac. Limited visualization of the lower thorax demonstrates minimal dependent subpleural ground-glass atelectasis. There is minimal subsegmental atelectasis adjacent to old right-sided posterior lateral rib fractures. Normal heart size.  Coronary artery calcifications. No acute or aggressive osseous abnormalities. Mild multilevel lumbar spine DDD, likely worse at L4-L5 with disc space height loss, endplate irregularity and sclerosis. Tiny mesenteric fat containing periumbilical hernia. Regional soft tissues appear otherwise normal. IMPRESSION: 1. No explanation for patient's right-sided flank pain. Specifically, no evidence of enteric or urinary obstruction.  Normal noncontrast appearance of the appendix. 2. Solitary punctate (approximately 3 mm) nonobstructing left-sided renal stone. No evidence of right-sided nephrolithiasis. 3. Atherosclerosis including coronary artery calcifications. Electronically Signed   By: Simonne Come M.D.   On: 12/04/2015 10:36   I have personally reviewed and evaluated these images and lab results as part of my medical decision-making.   EKG Interpretation None      MDM   Final diagnoses:  Diarrhea, unspecified type  Right flank pain    Patient here for evaluation of diarrhea and right flank pain. On repeat evaluation in the emergency department his flank pain is completely resolved. He feels like he may be passed a stone. She scan with no evidence of acute obstructive stone, but  this is obtained just after his symptoms resolved. BMP with mild elevation in his creatinine compared to priors, feel this is likely secondary to mild dehydration. Discussed with patient homecare for diarrhea, possible renal colic. Discussed outpatient follow-up, return precautions.    Tilden Fossa, MD 12/04/15 1058

## 2015-12-04 NOTE — Discharge Instructions (Signed)

## 2015-12-06 ENCOUNTER — Telehealth: Payer: Self-pay | Admitting: Family Medicine

## 2015-12-06 NOTE — Telephone Encounter (Signed)
Patient is currently taking lipitor, and patient stopped taking it due to having kidney stone. Patient is requesting Simvastatin to be [facility] Patient was seen last Thursday. Please call patient once complete.

## 2015-12-07 NOTE — Telephone Encounter (Signed)
Routed to Dr. Shah for medication change approval 

## 2015-12-08 NOTE — Telephone Encounter (Signed)
Spoke with patient's wife who reports that patient had a kidney stone 15 years ago when he was taking Lipitor and the pharmacist at that time linked it to Lipitor. Patient was on simvastatin, but his lipid panel was not at goal. I have requested her to check with the pharmacist about Crestor 10 mg at bedtime, which is a good option for patient if it does not cause kidney stones. She will check with the pharmacist and will call our office tomorrow.

## 2015-12-09 ENCOUNTER — Telehealth: Payer: Self-pay | Admitting: Family Medicine

## 2015-12-09 DIAGNOSIS — I2510 Atherosclerotic heart disease of native coronary artery without angina pectoris: Secondary | ICD-10-CM

## 2015-12-09 MED ORDER — ROSUVASTATIN CALCIUM 10 MG PO TABS: 10.0000 mg | ORAL_TABLET | Freq: Every day | ORAL | Status: DC

## 2015-12-09 NOTE — Telephone Encounter (Signed)
Prescription for Crestor 10 mg at bedtime sent to patient's pharmacy.

## 2015-12-09 NOTE — Telephone Encounter (Signed)
PT WIFE SAID TO LET YOU KNOW THAT HE CAN TAKE CRESTOR 10MG  SO THIS NEEDS TO BE CALLED INTO [facility] [address]

## 2015-12-09 NOTE — Telephone Encounter (Signed)
errenous °

## 2015-12-15 ENCOUNTER — Telehealth: Payer: Self-pay | Admitting: Family Medicine

## 2015-12-15 NOTE — Telephone Encounter (Signed)
Pt is currently on Crestor and its giving him kidney pain and he has stopped taking it. Pt is requesting to go back on Simvastatin. [facility] [address]

## 2015-12-16 ENCOUNTER — Ambulatory Visit: Payer: Self-pay | Admitting: Cardiovascular Disease

## 2015-12-16 NOTE — Telephone Encounter (Signed)
Patient's lipid panel was not at goal on simvastatin. Starting on atorvastatin and improve his lipids considerably and going back on simvastatin may cause his cholesterol to increase again, which will increase his risk for cardiovascular events. He already has established history of cardiovascular disease and he should follow up with cardiologist on the best course of action in this case.

## 2015-12-16 NOTE — Telephone Encounter (Signed)
Routed to Dr. Shah for advice on medication  °

## 2016-02-03 ENCOUNTER — Other Ambulatory Visit: Payer: Self-pay | Admitting: Family Medicine

## 2016-02-23 ENCOUNTER — Telehealth: Payer: Self-pay | Admitting: Family Medicine

## 2016-02-23 NOTE — Telephone Encounter (Signed)
Patient has appointment for 03-03-16 but is asking that you give him a prescription for his albuterol that he uses in his nebulizer machine. He only have enough for today and is having to use this 2-3 times every day. [facility] [address] Have been using is father in law albuterol (who is deceased) and now he is out.

## 2016-02-25 ENCOUNTER — Other Ambulatory Visit: Payer: Self-pay | Admitting: Family Medicine

## 2016-02-25 DIAGNOSIS — J4540 Moderate persistent asthma, uncomplicated: Secondary | ICD-10-CM

## 2016-02-25 MED ORDER — ALBUTEROL SULFATE (2.5 MG/3ML) 0.083% IN NEBU: 2.5000 mg | INHALATION_SOLUTION | Freq: Four times a day (QID) | RESPIRATORY_TRACT | Status: DC | PRN

## 2016-03-03 ENCOUNTER — Ambulatory Visit (INDEPENDENT_AMBULATORY_CARE_PROVIDER_SITE_OTHER): Payer: BLUE CROSS/BLUE SHIELD | Admitting: Family Medicine

## 2016-03-03 ENCOUNTER — Encounter: Payer: Self-pay | Admitting: Family Medicine

## 2016-03-03 VITALS — BP 128/70 | HR 76 | Temp 97.7°F | Resp 15 | Ht 70.0 in | Wt 199.4 lb

## 2016-03-03 DIAGNOSIS — K219 Gastro-esophageal reflux disease without esophagitis: Secondary | ICD-10-CM | POA: Diagnosis not present

## 2016-03-03 DIAGNOSIS — E785 Hyperlipidemia, unspecified: Secondary | ICD-10-CM | POA: Diagnosis not present

## 2016-03-03 DIAGNOSIS — I2510 Atherosclerotic heart disease of native coronary artery without angina pectoris: Secondary | ICD-10-CM | POA: Diagnosis not present

## 2016-03-03 DIAGNOSIS — R1031 Right lower quadrant pain: Secondary | ICD-10-CM | POA: Diagnosis not present

## 2016-03-03 DIAGNOSIS — F419 Anxiety disorder, unspecified: Secondary | ICD-10-CM

## 2016-03-03 MED ORDER — ESCITALOPRAM OXALATE 10 MG PO TABS: 10.0000 mg | ORAL_TABLET | Freq: Every day | ORAL | Status: DC

## 2016-03-03 MED ORDER — RABEPRAZOLE SODIUM 20 MG PO TBEC: 20.0000 mg | DELAYED_RELEASE_TABLET | Freq: Every day | ORAL | Status: DC

## 2016-03-03 MED ORDER — CLOPIDOGREL BISULFATE 75 MG PO TABS: 75.0000 mg | ORAL_TABLET | Freq: Every day | ORAL | Status: DC

## 2016-03-03 NOTE — Progress Notes (Signed)
Name: Paul Page   MRN: [id number]    DOB: [birth year]   Date:03/03/2016       Progress Note  Subjective  Chief Complaint  Chief Complaint  Patient presents with  . Follow-up    3 mo  . Hypertension  . Gastroesophageal Reflux  . Hyperlipidemia  . Medication Refill    Gastroesophageal Reflux He reports no abdominal pain, no chest pain, no coughing, no dysphagia, no early satiety or no heartburn. This is a chronic problem. The problem has been unchanged. He has tried a PPI for the symptoms.  Hyperlipidemia This is a chronic problem. The problem is uncontrolled. Recent lipid tests were reviewed and are high (Elevated Triglycerides). Associated symptoms include myalgias (Crestor initially when taken at night, his lower back felt sore when he would wake up in the morning, changed to taking in the morning and symptoms went away.). Pertinent negatives include no chest pain. Current antihyperlipidemic treatment includes statins. There are no compliance problems.  Risk factors for coronary artery disease include dyslipidemia, hypertension and male sex.  Anxiety Presents for follow-up visit. The problem has been gradually improving. Symptoms include excessive worry, irritability and nervous/anxious behavior. Patient reports no chest pain or depressed mood. The severity of symptoms is moderate.   There is no history of depression. Past treatments include SSRIs. Compliance with prior treatments has been good.  Groin Pain This is a new problem. Episode onset: 3 weeks ago. The problem occurs intermittently. Pertinent negatives include no abdominal pain, chest pain or coughing. Associated symptoms comments: Pain with certain movements, achy..     Past Medical History  Diagnosis Date  . Anxiety   . Hypertension   . Hyperlipidemia   . GERD (gastroesophageal reflux disease)   . Heart disease     Past Surgical History  Procedure Laterality Date  . Ankle fracture surgery    . Coronary stent  placement      Family History  Problem Relation Age of Onset  . Parkinson's disease Mother   . Cancer Father     Social History   Social History  . Marital Status: Married    Spouse Name: N/A  . Number of Children: N/A  . Years of Education: N/A   Occupational History  . Not on file.   Social History Main Topics  . Smoking status: Never Smoker   . Smokeless tobacco: Never Used  . Alcohol Use: 0.0 oz/week    0 Standard drinks or equivalent per week     Comment: occasional wine  . Drug Use: No  . Sexual Activity: Yes   Other Topics Concern  . Not on file   Social History Narrative     Current outpatient prescriptions:  .  acetaminophen (TYLENOL) 500 MG tablet, Take 500 mg by mouth every 6 (six) hours as needed for mild pain., Disp: , Rfl:  .  albuterol (PROAIR HFA) 108 (90 Base) MCG/ACT inhaler, Inhale into the lungs., Disp: , Rfl:  .  albuterol (PROVENTIL) (2.5 MG/3ML) 0.083% nebulizer solution, Take 3 mLs (2.5 mg total) by nebulization every 6 (six) hours as needed for wheezing or shortness of breath., Disp: 75 mL, Rfl: 11 .  B Complex CAPS, Take 1 capsule by mouth daily., Disp: , Rfl:  .  BYSTOLIC 5 MG tablet, Take 1 tablet (5 mg total) by mouth daily., Disp: 90 tablet, Rfl: 1 .  clopidogrel (PLAVIX) 75 MG tablet, Take 1 tablet (75 mg total) by mouth daily., Disp: 90 tablet, Rfl: 3 .  escitalopram (LEXAPRO) 10 MG tablet, Take 1 tablet (10 mg total) by mouth daily., Disp: 90 tablet, Rfl: 3 .  Fluticasone-Salmeterol (ADVAIR DISKUS) 100-50 MCG/DOSE AEPB, Inhale 1 puff into the lungs daily., Disp: 180 each, Rfl: 0 .  HYDROcodone-acetaminophen (NORCO/VICODIN) 5-325 MG tablet, Take 1 tablet by mouth every 6 (six) hours as needed., Disp: 6 tablet, Rfl: 0 .  Multiple Vitamins-Minerals (CENTRUM SILVER PO), Take 1 tablet by mouth daily., Disp: , Rfl:  .  PROAIR HFA 108 (90 Base) MCG/ACT inhaler, INHALE 2 PUFFS INTO THE LUNGS EVERY 6 (SIX) HOURS AS NEEDED FOR WHEEZING OR  SHORTNESS OF BREATH., Disp: 8 Inhaler, Rfl: 2 .  RABEprazole (ACIPHEX) 20 MG tablet, Take 1 tablet (20 mg total) by mouth daily., Disp: 90 tablet, Rfl: 1 .  rosuvastatin (CRESTOR) 10 MG tablet, Take 1 tablet (10 mg total) by mouth daily., Disp: 90 tablet, Rfl: 3  Allergies  Allergen Reactions  . Peanut Oil   . Tetracyclines & Related Other (See Comments)    Intolerance - stomach upset     Review of Systems  Constitutional: Positive for irritability.  Respiratory: Negative for cough.   Cardiovascular: Negative for chest pain.  Gastrointestinal: Negative for heartburn, dysphagia and abdominal pain.  Musculoskeletal: Positive for myalgias (Crestor initially when taken at night, his lower back felt sore when he would wake up in the morning, changed to taking in the morning and symptoms went away.).  Psychiatric/Behavioral: The patient is nervous/anxious.     Objective  Filed Vitals:   03/03/16 0922  BP: 128/70  Pulse: 76  Temp: 97.7 F (36.5 C)  TempSrc: Oral  Resp: 15  Height: 5\' 10"  (1.778 m)  Weight: 199 lb 6.4 oz (90.447 kg)  SpO2: 96%    Physical Exam  Constitutional: He is oriented to person, place, and time and well-developed, well-nourished, and in no distress.  HENT:  Head: Normocephalic and atraumatic.  Pulmonary/Chest: He has rales in the right lower field and the left lower field.  Abdominal: There is tenderness in the right lower quadrant. No hernia.    Abdominal wall muscle tenderness to palpation. No hernias.  Musculoskeletal:       Right ankle: He exhibits no swelling.       Left ankle: He exhibits no swelling.  Neurological: He is alert and oriented to person, place, and time.  Psychiatric: Mood, memory, affect and judgment normal.  Nursing note and vitals reviewed.     Assessment & Plan  1. Gastroesophageal reflux disease, esophagitis presence not specified Stable on PPI therapy. - RABEprazole (ACIPHEX) 20 MG tablet; Take 1 tablet (20 mg total)  by mouth daily.  Dispense: 90 tablet; Refill: 1  2. Anxiety Symptoms of anxiety are stable on Lexapro taken daily. - escitalopram (LEXAPRO) 10 MG tablet; Take 1 tablet (10 mg total) by mouth daily.  Dispense: 90 tablet; Refill: 1  3. Coronary artery disease involving native coronary artery of native heart without angina pectoris  - clopidogrel (PLAVIX) 75 MG tablet; Take 1 tablet (75 mg total) by mouth daily.  Dispense: 90 tablet; Refill: 3  4. Dyslipidemia Related triglycerides from lab work in December 2016. Repeat today. - Lipid Profile  5. Abdominal wall pain in right lower quadrant Reassured that there are no hernias, recommended that he apply a heating pad to affected area.  Ysenia Filice Asad A. Faylene KurtzShah [facility] 03/03/2016 9:51 AM

## 2016-03-17 LAB — LIPID PANEL
CHOL/HDL RATIO: 4.4 ratio (ref 0.0–5.0)
CHOLESTEROL TOTAL: 221 mg/dL — AB (ref 100–199)
HDL: 50 mg/dL (ref >39)
LDL CALC: 101 mg/dL — AB (ref 0–99)
TRIGLYCERIDES: 352 mg/dL — AB (ref 0–149)
VLDL Cholesterol Cal: 70 mg/dL — ABNORMAL HIGH (ref 5–40)

## 2016-04-10 ENCOUNTER — Encounter: Payer: Self-pay | Admitting: Cardiovascular Disease

## 2016-04-10 ENCOUNTER — Ambulatory Visit (INDEPENDENT_AMBULATORY_CARE_PROVIDER_SITE_OTHER): Payer: BLUE CROSS/BLUE SHIELD | Admitting: Cardiovascular Disease

## 2016-04-10 VITALS — BP 142/82 | HR 78 | Ht 70.0 in | Wt 195.2 lb

## 2016-04-10 DIAGNOSIS — I10 Essential (primary) hypertension: Secondary | ICD-10-CM

## 2016-04-10 DIAGNOSIS — I2510 Atherosclerotic heart disease of native coronary artery without angina pectoris: Secondary | ICD-10-CM | POA: Diagnosis not present

## 2016-04-10 DIAGNOSIS — E785 Hyperlipidemia, unspecified: Secondary | ICD-10-CM | POA: Diagnosis not present

## 2016-04-10 NOTE — Patient Instructions (Addendum)
Medication Instructions:  Your physician recommends that you continue on your current medications as directed. Please refer to the Current Medication list given to you today.   Labwork: none  Testing/Procedures: Your physician has requested that you have an exercise tolerance test. For further information please visit [email or web address] Please also follow instruction sheet, as given.  Friday, July 14, 10:30am Hold bystolic the morning of your test.    Follow-Up: Your physician wants you to follow-up in: 4 months with Dr. Kirke Corin.  You will receive a reminder letter in the mail two months in advance. If you don't receive a letter, please call our office to schedule the follow-up appointment.   Any Other Special Instructions Will Be Listed Below (If Applicable).     If you need a refill on your cardiac medications before your next appointment, please call your pharmacy.  Exercise Stress Electrocardiogram An exercise stress electrocardiogram is a test that is done to evaluate the blood supply to your heart. This test may also be called exercise stress electrocardiography. The test is done while you are walking on a treadmill. The goal of this test is to raise your heart rate. This test is done to find areas of poor blood flow to the heart by determining the extent of coronary artery disease (CAD).   CAD is defined as narrowing in one or more heart (coronary) arteries of more than 70%. If you have an abnormal test result, this may mean that you are not getting adequate blood flow to your heart during exercise. Additional testing may be needed to understand why your test was abnormal. LET [facility] CARE PROVIDER KNOW ABOUT:   Any allergies you have.  All medicines you are taking, including vitamins, herbs, eye drops, creams, and over-the-counter medicines.  Previous problems you or members of your family have had with the use of anesthetics.  Any blood disorders you  have.  Previous surgeries you have had.  Medical conditions you have.  Possibility of pregnancy, if this applies. RISKS AND COMPLICATIONS Generally, this is a safe procedure. However, as with any procedure, complications can occur. Possible complications can include:  Pain or pressure in the following areas:  Chest.  Jaw or neck.  Between your shoulder blades.  Radiating down your left arm.  Dizziness or light-headedness.  Shortness of breath.  Increased or irregular heartbeats.  Nausea or vomiting.  Heart attack (rare). BEFORE THE PROCEDURE  Avoid all forms of caffeine 24 hours before your test or as directed by your health care provider. This includes coffee, tea (even decaffeinated tea), caffeinated sodas, chocolate, cocoa, and certain pain medicines.  Follow your health care provider's instructions regarding eating and drinking before the test.  Take your medicines as directed at regular times with water unless instructed otherwise. Exceptions may include:  If you have diabetes, ask how you are to take your insulin or pills. It is common to adjust insulin dosing the morning of the test.  If you are taking beta-blocker medicines, it is important to talk to your health care provider about these medicines well before the date of your test. Taking beta-blocker medicines may interfere with the test. In some cases, these medicines need to be changed or stopped 24 hours or more before the test.  If you wear a nitroglycerin patch, it may need to be removed prior to the test. Ask your health care provider if the patch should be removed before the test.  If you use an inhaler for any breathing condition,  bring it with you to the test.  If you are an outpatient, bring a snack so you can eat right after the stress phase of the test.  Do not smoke for 4 hours prior to the test or as directed by your health care provider.  Do not apply lotions, powders, creams, or oils on your  chest prior to the test.  Wear loose-fitting clothes and comfortable shoes for the test. This test involves walking on a treadmill. PROCEDURE  Multiple patches (electrodes) will be put on your chest. If needed, small areas of your chest may have to be shaved to get better contact with the electrodes. Once the electrodes are attached to your body, multiple wires will be attached to the electrodes and your heart rate will be monitored.  Your heart will be monitored both at rest and while exercising.  You will walk on a treadmill. The treadmill will be started at a slow pace. The treadmill speed and incline will gradually be increased to raise your heart rate. AFTER THE PROCEDURE  Your heart rate and blood pressure will be monitored after the test.  You may return to your normal schedule including diet, activities, and medicines, unless your health care provider tells you otherwise.   This information is not intended to replace advice given to you by your health care provider. Make sure you discuss any questions you have with your health care provider.   Document Released: 09/15/2000 Document Revised: 09/23/2013 Document Reviewed: 05/26/2013 Elsevier Interactive Patient Education [employer]2016 [employer]

## 2016-04-10 NOTE — Progress Notes (Signed)
Cardiology Office Note   Date:  04/10/2016   ID:  [name], DOB 1955-12-26, MRN [id number]  PCP:  Brayton El, MD  Cardiologist:   Lorine Bears, MD   Chief Complaint  Patient presents with  . other    Self referral to establish care for CAD, s/p stent placments at [facility]. "doing well."       History of Present Illness: Fitzroy Mikami is a 60 y.o. male who was referred by Dr. Sherryll Burger to establish cardiovascular care.  He has known history of coronary artery disease which has been treated at [facility] in [place]. The patient moved to the [place] area to be close to family. He presented in 2008 with unstable angina. Cardiac catheterization showed severe three-vessel coronary artery disease with normal ejection fraction. He underwent successful angioplasty and three-vessel drug-eluting stent placement. He returned in 2011 with unstable angina. Repeat cardiac catheterization showed new disease in the LAD and RCA. Both of them were treated with drug-eluting stents. No cardiac events since then. He has chronic medical conditions that include hypertension, hyperlipidemia and palpitations controlled with a small dose beta blocker. He reports strong family history of coronary artery disease and hyperlipidemia. Based on his description, it appears that he likely has familial hyperlipidemia. He has been doing well with no chest pain. He describes mild exertional dyspnea with no orthopnea, PND or leg edema. He takes his medications regularly. He is not a smoker.   Past Medical History  Diagnosis Date  . Anxiety   . Hypertension   . Hyperlipidemia   . GERD (gastroesophageal reflux disease)   . Heart disease   . Asthma   . Coronary artery disease     Three-vessel coronary artery disease diagnosed in October 2008 with normal ejection fraction. PCI and drug-eluting stent placement to the LAD(3.0 x 8 mm Promus), left circumflex(2.5 x 12 mm Promus) and right coronary artery(3.5 x 12 mm  Promus). Unstable angina in 2011. PCI of RCA with a 3.5 x 15 mm Promus and PCI of LAD with a 2.5 x 28 mm Promus drug-eluting stents     Past Surgical History  Procedure Laterality Date  . Ankle fracture surgery    . Cardiac catheterization  2011  . Cardiac catheterization  2008  . Coronary stent placement  2008 & 2011     Current Outpatient Prescriptions  Medication Sig Dispense Refill  . acetaminophen (TYLENOL) 500 MG tablet Take 500 mg by mouth every 6 (six) hours as needed for mild pain.    Marland Kitchen albuterol (PROAIR HFA) 108 (90 Base) MCG/ACT inhaler Inhale into the lungs.    . B Complex CAPS Take 1 capsule by mouth daily.    Marland Kitchen BYSTOLIC 5 MG tablet Take 1 tablet (5 mg total) by mouth daily. 90 tablet 1  . clopidogrel (PLAVIX) 75 MG tablet Take 1 tablet (75 mg total) by mouth daily. 90 tablet 3  . escitalopram (LEXAPRO) 10 MG tablet Take 1 tablet (10 mg total) by mouth daily. 90 tablet 1  . Fluticasone-Salmeterol (ADVAIR DISKUS) 100-50 MCG/DOSE AEPB Inhale 1 puff into the lungs daily. 180 each 0  . Multiple Vitamins-Minerals (CENTRUM SILVER PO) Take 1 tablet by mouth daily.    Marland Kitchen PROAIR HFA 108 (90 Base) MCG/ACT inhaler INHALE 2 PUFFS INTO THE LUNGS EVERY 6 (SIX) HOURS AS NEEDED FOR WHEEZING OR SHORTNESS OF BREATH. 8 Inhaler 2  . RABEprazole (ACIPHEX) 20 MG tablet Take 1 tablet (20 mg total) by mouth daily. 90 tablet  1  . rosuvastatin (CRESTOR) 20 MG tablet Take 20 mg by mouth daily.     No current facility-administered medications for this visit.    Allergies:   Peanut oil and Tetracyclines & related    Social History:  The patient  reports that he has never smoked. He has never used smokeless tobacco. He reports that he drinks alcohol. He reports that he does not use illicit drugs.   Family History:  The patient's family history includes Cancer in his father; Parkinson's disease in his mother.  Family history of coronary artery disease and hyperlipidemia.   ROS:  Please see the  history of present illness.   Otherwise, review of systems are positive for none.   All other systems are reviewed and negative.    PHYSICAL EXAM: VS:  BP 142/82 mmHg  Pulse 78  Ht 5\' 10"  (1.778 m)  Wt 195 lb 4 oz (88.565 kg)  BMI 28.02 kg/m2 , BMI Body mass index is 28.02 kg/(m^2). GEN: Well nourished, well developed, in no acute distress HEENT: normal Neck: no JVD, carotid bruits, or masses Cardiac: RRR; no murmurs, rubs, or gallops,no edema  Respiratory:  clear to auscultation bilaterally, normal work of breathing GI: soft, nontender, nondistended, + BS MS: no deformity or atrophy Skin: warm and dry, no rash Neuro:  Strength and sensation are intact Psych: euthymic mood, full affect   EKG:  EKG is ordered today. The ekg ordered today demonstrates normal sinus rhythm with a PVC.    Recent Labs: 12/04/2015: ALT 28; BUN 16; Creatinine, Ser 1.33*; Hemoglobin 15.0; Platelets 250; Potassium 4.3; Sodium 136    Lipid Panel    Component Value Date/Time   CHOL 221* 03/16/2016 0931   TRIG 352* 03/16/2016 0931   HDL 50 03/16/2016 0931   CHOLHDL 4.4 03/16/2016 0931   LDLCALC 101* 03/16/2016 0931      Wt Readings from Last 3 Encounters:  04/10/16 195 lb 4 oz (88.565 kg)  03/03/16 199 lb 6.4 oz (90.447 kg)  12/02/15 199 lb 4.8 oz (90.402 kg)        ASSESSMENT AND PLAN:  1.  Coronary artery disease involving native coronary arteries: Mild exertional dyspnea could be angina equivalent. I requested a treadmill stress test for evaluation. Given that he has 5 drug-eluting stents, I recommend lifelong dual antiplatelet therapy as long as tolerated. We discussed today the importance of lifestyle changes including regular exercise and healthy diet.  2. Hyperlipidemia: Most recent lipid profile showed a triglyceride of 352, HDL of 50 and an LDL of 101. Since then, he has improved his diet and the dose of rosuvastatin was increased to 20 mg once daily. We have to be very aggressive  treating his hyperlipidemia. He will get a follow-up labs in the next few months. I would consider increasing the dose of rosuvastatin 40 mg once daily and if needed adding Zetia. PCSK 9 inhibitors are an option if needed. Interestingly, he reports that he was enrolled in one of the studies in the past.  3. Palpitations: Controlled with small dose Bystolic.     A total of 30 minutes was needed to review his previous records and update his chart.    Disposition:   FU with me in 4 months  Signed,  Lorine BearsMuhammad Brenley Priore, MD  04/10/2016 5:22 PM    [facility]

## 2016-04-14 ENCOUNTER — Ambulatory Visit (INDEPENDENT_AMBULATORY_CARE_PROVIDER_SITE_OTHER): Payer: BLUE CROSS/BLUE SHIELD

## 2016-04-14 DIAGNOSIS — I2510 Atherosclerotic heart disease of native coronary artery without angina pectoris: Secondary | ICD-10-CM

## 2016-04-14 LAB — EXERCISE TOLERANCE TEST
CHL CUP MPHR: 78 {beats}/min
CHL CUP RESTING HR STRESS: 87 {beats}/min
CHL CUP STRESS STAGE 1 GRADE: 0 %
CHL CUP STRESS STAGE 2 GRADE: 0 %
CHL CUP STRESS STAGE 2 HR: 85 {beats}/min
CHL CUP STRESS STAGE 2 SPEED: 0 mph
CHL CUP STRESS STAGE 3 SPEED: 0 mph
CHL CUP STRESS STAGE 4 DBP: 88 mmHg
CHL CUP STRESS STAGE 4 GRADE: 10 %
CHL CUP STRESS STAGE 4 SBP: 175 mmHg
CHL CUP STRESS STAGE 4 SPEED: 1.7 mph
CHL CUP STRESS STAGE 5 DBP: 79 mmHg
CHL CUP STRESS STAGE 5 HR: 125 {beats}/min
CHL CUP STRESS STAGE 5 SBP: 216 mmHg
CHL CUP STRESS STAGE 6 DBP: 78 mmHg
CHL CUP STRESS STAGE 6 HR: 142 {beats}/min
CHL CUP STRESS STAGE 6 SBP: 229 mmHg
CHL CUP STRESS STAGE 7 GRADE: 0 %
CHL CUP STRESS STAGE 8 GRADE: 0 %
CHL CUP STRESS STAGE 8 HR: 92 {beats}/min
CHL CUP STRESS STAGE 8 SBP: 132 mmHg
CHL CUP STRESS STAGE 8 SPEED: 0 mph
CSEPED: 7 min
CSEPEDS: 33 s
CSEPEW: 9.4 METS
Peak BP: 229 mmHg
Peak HR: 142 {beats}/min
Percent HR: 88 %
Percent of predicted max HR: 88 %
Stage 1 HR: 89 {beats}/min
Stage 1 Speed: 0 mph
Stage 2 DBP: 95 mmHg
Stage 2 SBP: 146 mmHg
Stage 3 Grade: 0.1 %
Stage 3 HR: 85 {beats}/min
Stage 4 HR: 106 {beats}/min
Stage 5 Grade: 12 %
Stage 5 Speed: 2.5 mph
Stage 6 Grade: 14 %
Stage 6 Speed: 3.4 mph
Stage 7 HR: 129 {beats}/min
Stage 7 Speed: 0 mph
Stage 8 DBP: 95 mmHg

## 2016-04-19 ENCOUNTER — Other Ambulatory Visit: Payer: Self-pay

## 2016-04-19 DIAGNOSIS — I10 Essential (primary) hypertension: Secondary | ICD-10-CM

## 2016-04-19 MED ORDER — NEBIVOLOL HCL 10 MG PO TABS: 10.0000 mg | ORAL_TABLET | Freq: Every day | ORAL | Status: DC

## 2016-04-28 NOTE — Telephone Encounter (Signed)
Looked at chart this was refilled on 03/03/2016

## 2016-05-02 ENCOUNTER — Other Ambulatory Visit: Payer: Self-pay | Admitting: Emergency Medicine

## 2016-05-02 MED ORDER — ROSUVASTATIN CALCIUM 20 MG PO TABS: 20.0000 mg | ORAL_TABLET | Freq: Every day | ORAL | 0 refills | Status: DC

## 2016-05-28 ENCOUNTER — Other Ambulatory Visit: Payer: Self-pay | Admitting: Family Medicine

## 2016-06-28 ENCOUNTER — Other Ambulatory Visit: Payer: Self-pay | Admitting: Family Medicine

## 2016-07-17 ENCOUNTER — Other Ambulatory Visit: Payer: Self-pay | Admitting: Family Medicine

## 2016-07-17 DIAGNOSIS — J4540 Moderate persistent asthma, uncomplicated: Secondary | ICD-10-CM

## 2016-07-18 ENCOUNTER — Other Ambulatory Visit: Payer: Self-pay | Admitting: Cardiovascular Disease

## 2016-07-18 ENCOUNTER — Other Ambulatory Visit: Payer: Self-pay

## 2016-07-18 MED ORDER — NEBIVOLOL HCL 10 MG PO TABS: 10.0000 mg | ORAL_TABLET | Freq: Every day | ORAL | 3 refills | Status: DC

## 2016-07-27 ENCOUNTER — Other Ambulatory Visit: Payer: Self-pay | Admitting: Family Medicine

## 2016-08-04 ENCOUNTER — Ambulatory Visit (INDEPENDENT_AMBULATORY_CARE_PROVIDER_SITE_OTHER): Payer: BLUE CROSS/BLUE SHIELD | Admitting: Family Medicine

## 2016-08-04 ENCOUNTER — Encounter: Payer: Self-pay | Admitting: Family Medicine

## 2016-08-04 VITALS — BP 140/73 | HR 75 | Temp 98.9°F | Resp 16 | Ht 70.0 in | Wt 199.2 lb

## 2016-08-04 DIAGNOSIS — Z23 Encounter for immunization: Secondary | ICD-10-CM | POA: Diagnosis not present

## 2016-08-04 DIAGNOSIS — R1031 Right lower quadrant pain: Secondary | ICD-10-CM

## 2016-08-04 LAB — CBC WITH DIFFERENTIAL/PLATELET
Basophils Absolute: 52 cells/uL (ref 0–200)
Basophils Relative: 1 %
EOS ABS: 260 {cells}/uL (ref 15–500)
Eosinophils Relative: 5 %
HCT: 44.6 % (ref 38.5–50.0)
Hemoglobin: 15.6 g/dL (ref 13.2–17.1)
LYMPHS PCT: 23 %
Lymphs Abs: 1196 cells/uL (ref 850–3900)
MCH: 32.5 pg (ref 27.0–33.0)
MCHC: 35 g/dL (ref 32.0–36.0)
MCV: 92.9 fL (ref 80.0–100.0)
MONOS PCT: 12 %
MPV: 9.7 fL (ref 7.5–12.5)
Monocytes Absolute: 624 cells/uL (ref 200–950)
NEUTROS PCT: 59 %
Neutro Abs: 3068 cells/uL (ref 1500–7800)
PLATELETS: 273 10*3/uL (ref 140–400)
RBC: 4.8 MIL/uL (ref 4.20–5.80)
RDW: 13.3 % (ref 11.0–15.0)
WBC: 5.2 10*3/uL (ref 3.8–10.8)

## 2016-08-04 NOTE — Progress Notes (Signed)
Name: Paul Page: [id number]    DOB: 11-15-1955   Date:08/04/2016       Progress Note  Subjective  Chief Complaint  Chief Complaint  Patient presents with  . Abdominal Pain    Abdominal Pain  This is a recurrent problem. The current episode started more than 1 month ago. The problem occurs intermittently. The problem has been unchanged. The pain is located in the RLQ. The pain is at a severity of 6/10. Associated symptoms include nausea. Pertinent negatives include no constipation, diarrhea, fever, hematochezia, hematuria, vomiting or weight loss. The pain is aggravated by certain positions.     Past Medical History:  Diagnosis Date  . Anxiety   . Asthma   . Coronary artery disease    Three-vessel coronary artery disease diagnosed in October 2008 with normal ejection fraction. PCI and drug-eluting stent placement to the LAD(3.0 x 8 mm Promus), left circumflex(2.5 x 12 mm Promus) and right coronary artery(3.5 x 12 mm Promus). Unstable angina in 2011. PCI of RCA with a 3.5 x 15 mm Promus and PCI of LAD with a 2.5 x 28 mm Promus drug-eluting stents   . GERD (gastroesophageal reflux disease)   . Heart disease   . Hyperlipidemia   . Hypertension     Past Surgical History:  Procedure Laterality Date  . ANKLE FRACTURE SURGERY    . CARDIAC CATHETERIZATION  2011  . CARDIAC CATHETERIZATION  2008  . CORONARY STENT PLACEMENT  2008 & 2011    Family History  Problem Relation Age of Onset  . Parkinson's disease Mother   . Cancer Father     Social History   Social History  . Marital status: Married    Spouse name: N/A  . Number of children: N/A  . Years of education: N/A   Occupational History  . Not on file.   Social History Main Topics  . Smoking status: Never Smoker  . Smokeless tobacco: Never Used  . Alcohol use 0.0 oz/week     Comment: occasional wine  . Drug use: No  . Sexual activity: Yes   Other Topics Concern  . Not on file   Social History  Narrative  . No narrative on file     Current Outpatient Prescriptions:  .  ADVAIR DISKUS 100-50 MCG/DOSE AEPB, INHALE 1 PUFF INTO THE LUNGS DAILY., Disp: 6 each, Rfl: 0 .  albuterol (PROAIR HFA) 108 (90 Base) MCG/ACT inhaler, Inhale into the lungs., Disp: , Rfl:  .  B Complex CAPS, Take 1 capsule by mouth daily., Disp: , Rfl:  .  BYSTOLIC 10 MG tablet, TAKE 1 TABLET (10 MG TOTAL) BY MOUTH DAILY., Disp: 30 tablet, Rfl: 3 .  clopidogrel (PLAVIX) 75 MG tablet, Take 1 tablet (75 mg total) by mouth daily., Disp: 90 tablet, Rfl: 3 .  escitalopram (LEXAPRO) 10 MG tablet, Take 1 tablet (10 mg total) by mouth daily., Disp: 90 tablet, Rfl: 1 .  Multiple Vitamins-Minerals (CENTRUM SILVER PO), Take 1 tablet by mouth daily., Disp: , Rfl:  .  nebivolol (BYSTOLIC) 10 MG tablet, Take 1 tablet (10 mg total) by mouth daily., Disp: 30 tablet, Rfl: 3 .  RABEprazole (ACIPHEX) 20 MG tablet, Take 1 tablet (20 mg total) by mouth daily., Disp: 90 tablet, Rfl: 1 .  rosuvastatin (CRESTOR) 20 MG tablet, TAKE 1 TABLET (20 MG TOTAL) BY MOUTH DAILY., Disp: 30 tablet, Rfl: 0 .  acetaminophen (TYLENOL) 500 MG tablet, Take 500 mg by mouth every 6 (six)  hours as needed for mild pain., Disp: , Rfl:   Allergies  Allergen Reactions  . Peanut Oil   . Tetracyclines & Related Other (See Comments)    Intolerance - stomach upset     Review of Systems  Constitutional: Negative for fever and weight loss.  Gastrointestinal: Positive for abdominal pain and nausea. Negative for constipation, diarrhea, hematochezia and vomiting.  Genitourinary: Negative for hematuria.    Objective  Vitals:   08/04/16 1017  BP: 140/73  Pulse: 75  Resp: 16  Temp: 98.9 F (37.2 C)  TempSrc: Oral  SpO2: 95%  Weight: 199 lb 3.2 oz (90.4 kg)  Height: 5\' 10"  (1.778 m)    Physical Exam  Constitutional: He is well-developed, well-nourished, and in no distress.  HENT:  Head: Normocephalic and atraumatic.  Cardiovascular: Normal rate,  regular rhythm and normal heart sounds.   No murmur heard. Pulmonary/Chest: Effort normal and breath sounds normal. He has no wheezes.  Abdominal: Soft. Bowel sounds are normal. He exhibits no distension. There is tenderness in the right lower quadrant. There is no rebound, no CVA tenderness and no tenderness at McBurney's point.    Psychiatric: Mood, memory, affect and judgment normal.  Nursing note and vitals reviewed.   Assessment & Plan  1. Abdominal pain, RLQ Intermittent but persistent right lower quadrant abdominal pain, negative CT abdomen pelvis in March 2017. Obtain MRI of abdomen and pelvis for evaluation. - MR Abdomen W Wo Contrast; Future - MR Pelvis W Wo Contrast; Future - CBC with Differential - COMPLETE METABOLIC PANEL WITH GFR - Urinalysis, Routine w reflex microscopic  2. Need for influenza vaccination  - Flu vaccine HIGH DOSE PF (Fluzone High dose)   [name] Asad A. Faylene KurtzShah [facility] 08/04/2016 10:24 AM

## 2016-08-05 LAB — URINALYSIS, ROUTINE W REFLEX MICROSCOPIC
Bilirubin Urine: NEGATIVE
Glucose, UA: NEGATIVE
HGB URINE DIPSTICK: NEGATIVE
Ketones, ur: NEGATIVE
LEUKOCYTES UA: NEGATIVE
NITRITE: NEGATIVE
PH: 5.5 (ref 5.0–8.0)
PROTEIN: NEGATIVE
Specific Gravity, Urine: 1.021 (ref 1.001–1.035)

## 2016-08-05 LAB — COMPLETE METABOLIC PANEL WITH GFR
ALT: 30 U/L (ref 9–46)
AST: 29 U/L (ref 10–35)
Albumin: 4.5 g/dL (ref 3.6–5.1)
Alkaline Phosphatase: 67 U/L (ref 40–115)
BILIRUBIN TOTAL: 0.6 mg/dL (ref 0.2–1.2)
BUN: 20 mg/dL (ref 7–25)
CO2: 27 mmol/L (ref 20–31)
Calcium: 9.9 mg/dL (ref 8.6–10.3)
Chloride: 102 mmol/L (ref 98–110)
Creat: 1.09 mg/dL (ref 0.70–1.33)
GFR, EST AFRICAN AMERICAN: 85 mL/min (ref >=60)
GFR, EST NON AFRICAN AMERICAN: 74 mL/min (ref >=60)
Glucose, Bld: 89 mg/dL (ref 65–99)
Potassium: 4.6 mmol/L (ref 3.5–5.3)
Sodium: 139 mmol/L (ref 135–146)
TOTAL PROTEIN: 7.5 g/dL (ref 6.1–8.1)

## 2016-08-07 ENCOUNTER — Other Ambulatory Visit: Payer: Self-pay | Admitting: Family Medicine

## 2016-08-07 DIAGNOSIS — R1031 Right lower quadrant pain: Secondary | ICD-10-CM

## 2016-08-14 ENCOUNTER — Ambulatory Visit: Payer: BLUE CROSS/BLUE SHIELD

## 2016-08-15 ENCOUNTER — Other Ambulatory Visit: Payer: Self-pay | Admitting: Family Medicine

## 2016-08-15 DIAGNOSIS — J4540 Moderate persistent asthma, uncomplicated: Secondary | ICD-10-CM

## 2016-08-27 ENCOUNTER — Other Ambulatory Visit: Payer: Self-pay | Admitting: Family Medicine

## 2016-08-29 ENCOUNTER — Ambulatory Visit (INDEPENDENT_AMBULATORY_CARE_PROVIDER_SITE_OTHER): Payer: BLUE CROSS/BLUE SHIELD | Admitting: Cardiovascular Disease

## 2016-08-29 ENCOUNTER — Encounter: Payer: Self-pay | Admitting: Cardiovascular Disease

## 2016-08-29 VITALS — BP 130/76 | HR 82 | Ht 70.0 in | Wt 201.2 lb

## 2016-08-29 DIAGNOSIS — I10 Essential (primary) hypertension: Secondary | ICD-10-CM

## 2016-08-29 DIAGNOSIS — I2510 Atherosclerotic heart disease of native coronary artery without angina pectoris: Secondary | ICD-10-CM

## 2016-08-29 DIAGNOSIS — E785 Hyperlipidemia, unspecified: Secondary | ICD-10-CM | POA: Diagnosis not present

## 2016-08-29 MED ORDER — NEBIVOLOL HCL 5 MG PO TABS: 5.0000 mg | ORAL_TABLET | Freq: Every day | ORAL | 5 refills | Status: DC

## 2016-08-29 MED ORDER — LOSARTAN POTASSIUM 50 MG PO TABS: 50.0000 mg | ORAL_TABLET | Freq: Every day | ORAL | 5 refills | Status: DC

## 2016-08-29 NOTE — Patient Instructions (Addendum)
Medication Instructions:  Your physician has recommended you make the following change in your medication:  DECREASE bystolic to 5mg  once daily START taking losartan 50mg  once daily   Labwork: BMET, liver, lipid in one week. Nothing to eat or drink after midnight the evening before your labs.   Testing/Procedures: none  Follow-Up: Your physician wants you to follow-up in: six months with Dr. Kirke CorinArida. You will receive a reminder letter in the mail two months in advance. If you don't receive a letter, please call our office to schedule the follow-up appointment.   Any Other Special Instructions Will Be Listed Below (If Applicable).     If you need a refill on your cardiac medications before your next appointment, please call your pharmacy.

## 2016-08-29 NOTE — Progress Notes (Signed)
Cardiology Office Note   Date:  08/29/2016   ID:  Paul Mohairichard Candela, DOB 1955/11/22, MRN [id number]  PCP:  Brayton ElSyed Shah, MD  Cardiologist:   Lorine BearsMuhammad Suhaas Agena, MD   Chief Complaint  Patient presents with  . other    4 month follow up. Meds reviewed by the pt. verbally. Pt. c/o feeling tired by evening.       History of Present Illness: Paul Page is a 60 y.o. male who is here today for a follow-up visit regarding coronary artery disease which has been treated at [facility] [place]. The patient moved to the [place] area to be close to family. He presented in 2008 with unstable angina. Cardiac catheterization showed severe three-vessel coronary artery disease with normal ejection fraction. He underwent successful angioplasty and three-vessel drug-eluting stent placement. He returned in 2011 with unstable angina. Repeat cardiac catheterization showed new disease in the LAD and RCA. Both of them were treated with drug-eluting stents. No cardiac events since then. He has chronic medical conditions that include hypertension, hyperlipidemia and palpitations controlled with a small dose beta blocker. He has strong family history of coronary artery disease and hyperlipidemia. He is not a smoker. He underwent a treadmill stress test in July which was negative for ischemia but she had hypertensive response to exercise. He was able to exercise for 7-1/2 minutes. The dose of Bystolic was increased to 10 mg daily. He has been doing very well with no chest pain or shortness of breath. However, since increasing the dose of Bystolic, he has experienced increased fatigue and sleepiness in the afternoon. He is otherwise taking all his medications.   Past Medical History:  Diagnosis Date  . Anxiety   . Asthma   . Coronary artery disease    Three-vessel coronary artery disease diagnosed in October 2008 with normal ejection fraction. PCI and drug-eluting stent placement to the LAD(3.0 x 8 mm Promus),  left circumflex(2.5 x 12 mm Promus) and right coronary artery(3.5 x 12 mm Promus). Unstable angina in 2011. PCI of RCA with a 3.5 x 15 mm Promus and PCI of LAD with a 2.5 x 28 mm Promus drug-eluting stents   . GERD (gastroesophageal reflux disease)   . Heart disease   . Hyperlipidemia   . Hypertension     Past Surgical History:  Procedure Laterality Date  . ANKLE FRACTURE SURGERY    . CARDIAC CATHETERIZATION  2011  . CARDIAC CATHETERIZATION  2008  . CORONARY STENT PLACEMENT  2008 & 2011     Current Outpatient Prescriptions  Medication Sig Dispense Refill  . ADVAIR DISKUS 100-50 MCG/DOSE AEPB INHALE 1 PUFF INTO THE LUNGS DAILY. 60 each 0  . albuterol (PROAIR HFA) 108 (90 Base) MCG/ACT inhaler Inhale into the lungs.    . B Complex CAPS Take 1 capsule by mouth daily.    . clopidogrel (PLAVIX) 75 MG tablet Take 1 tablet (75 mg total) by mouth daily. 90 tablet 3  . escitalopram (LEXAPRO) 10 MG tablet Take 1 tablet (10 mg total) by mouth daily. 90 tablet 1  . Multiple Vitamins-Minerals (CENTRUM SILVER PO) Take 1 tablet by mouth daily.    . nebivolol (BYSTOLIC) 10 MG tablet Take 20 mg by mouth daily.    . RABEprazole (ACIPHEX) 20 MG tablet Take 1 tablet (20 mg total) by mouth daily. 90 tablet 1  . rosuvastatin (CRESTOR) 20 MG tablet TAKE 1 TABLET (20 MG TOTAL) BY MOUTH DAILY. 30 tablet 0   No current facility-administered  medications for this visit.     Allergies:   Peanut oil and Tetracyclines & related    Social History:  The patient  reports that he has never smoked. He has never used smokeless tobacco. He reports that he drinks alcohol. He reports that he does not use drugs.   Family History:  The patient's family history includes Cancer in his father; Parkinson's disease in his mother.  Family history of coronary artery disease and hyperlipidemia.   ROS:  Please see the history of present illness.   Otherwise, review of systems are positive for none.   All other systems are  reviewed and negative.    PHYSICAL EXAM: VS:  BP 130/76 (BP Location: Left Arm, Patient Position: Sitting, Cuff Size: Normal)   Pulse 82   Ht 5\' 10"  (1.778 m)   Wt 201 lb 4 oz (91.3 kg)   SpO2 98%   BMI 28.88 kg/m  , BMI Body mass index is 28.88 kg/m. GEN: Well nourished, well developed, in no acute distress  HEENT: normal  Neck: no JVD, carotid bruits, or masses Cardiac: RRR; no murmurs, rubs, or gallops,no edema  Respiratory:  clear to auscultation bilaterally, normal work of breathing GI: soft, nontender, nondistended, + BS MS: no deformity or atrophy  Skin: warm and dry, no rash Neuro:  Strength and sensation are intact Psych: euthymic mood, full affect   EKG:  EKG is not ordered today.    Recent Labs: 08/04/2016: ALT 30; BUN 20; Creat 1.09; Hemoglobin 15.6; Platelets 273; Potassium 4.6; Sodium 139    Lipid Panel    Component Value Date/Time   CHOL 221 (H) 03/16/2016 0931   TRIG 352 (H) 03/16/2016 0931   HDL 50 03/16/2016 0931   CHOLHDL 4.4 03/16/2016 0931   LDLCALC 101 (H) 03/16/2016 0931      Wt Readings from Last 3 Encounters:  08/29/16 201 lb 4 oz (91.3 kg)  08/04/16 199 lb 3.2 oz (90.4 kg)  04/10/16 195 lb 4 oz (88.6 kg)        ASSESSMENT AND PLAN:  1. Coronary artery disease involving native coronary arteries: He is doing well with no anginal symptoms. Given that he has 5 drug-eluting stents, I recommend lifelong dual antiplatelet therapy as long as tolerated. We discussed today the importance of lifestyle changes including regular exercise and healthy diet.  2. Hyperlipidemia: Most recent lipid profile showed a triglyceride of 352, HDL of 50 and an LDL of 101. Since then, he has improved his diet and the dose of rosuvastatin was increased to 20 mg once daily.  I requested a follow-up lipid and liver profile.  3. Essential hypertension: He is having increased fatigue with a beta blocker. Thus, I decreased the dose back to 5 mg once daily and added  losartan 50 mg once daily. Check basic metabolic profile in one week.   3. Palpitations: Controlled with small dose Bystolic.    Disposition:   FU with me in 6 months  Signed,  Lorine BearsMuhammad Estevan Kersh, MD  08/29/2016 3:43 PM    [facility]

## 2016-09-01 ENCOUNTER — Encounter: Payer: Self-pay | Admitting: Family Medicine

## 2016-09-04 ENCOUNTER — Other Ambulatory Visit: Payer: Self-pay | Admitting: Family Medicine

## 2016-09-04 ENCOUNTER — Ambulatory Visit: Payer: BLUE CROSS/BLUE SHIELD | Admitting: Family Medicine

## 2016-09-04 DIAGNOSIS — K219 Gastro-esophageal reflux disease without esophagitis: Secondary | ICD-10-CM

## 2016-09-05 ENCOUNTER — Other Ambulatory Visit (INDEPENDENT_AMBULATORY_CARE_PROVIDER_SITE_OTHER): Payer: BLUE CROSS/BLUE SHIELD | Admitting: *Deleted

## 2016-09-05 DIAGNOSIS — I10 Essential (primary) hypertension: Secondary | ICD-10-CM

## 2016-09-05 DIAGNOSIS — E785 Hyperlipidemia, unspecified: Secondary | ICD-10-CM

## 2016-09-06 LAB — HEPATIC FUNCTION PANEL
ALT: 34 IU/L (ref 0–44)
AST: 28 IU/L (ref 0–40)
Albumin: 4.5 g/dL (ref 3.5–5.5)
Alkaline Phosphatase: 81 IU/L (ref 39–117)
BILIRUBIN, DIRECT: 0.13 mg/dL (ref 0.00–0.40)
Bilirubin Total: 0.5 mg/dL (ref 0.0–1.2)
TOTAL PROTEIN: 7.1 g/dL (ref 6.0–8.5)

## 2016-09-06 LAB — BASIC METABOLIC PANEL
BUN/Creatinine Ratio: 14 (ref 9–20)
BUN: 14 mg/dL (ref 6–24)
CALCIUM: 9.4 mg/dL (ref 8.7–10.2)
CO2: 25 mmol/L (ref 18–29)
Chloride: 101 mmol/L (ref 96–106)
Creatinine, Ser: 0.99 mg/dL (ref 0.76–1.27)
GFR, EST AFRICAN AMERICAN: 96 mL/min/{1.73_m2} (ref >59)
GFR, EST NON AFRICAN AMERICAN: 83 mL/min/{1.73_m2} (ref >59)
Glucose: 101 mg/dL — ABNORMAL HIGH (ref 65–99)
POTASSIUM: 4.3 mmol/L (ref 3.5–5.2)
Sodium: 144 mmol/L (ref 134–144)

## 2016-09-06 LAB — LIPID PANEL
CHOL/HDL RATIO: 4.5 ratio (ref 0.0–5.0)
Cholesterol, Total: 206 mg/dL — ABNORMAL HIGH (ref 100–199)
HDL: 46 mg/dL (ref >39)
LDL CALC: 81 mg/dL (ref 0–99)
Triglycerides: 397 mg/dL — ABNORMAL HIGH (ref 0–149)
VLDL CHOLESTEROL CAL: 79 mg/dL — AB (ref 5–40)

## 2016-09-18 ENCOUNTER — Other Ambulatory Visit: Payer: Self-pay

## 2016-09-18 MED ORDER — FISH OIL 1000 MG PO CAPS: 3.0000 | ORAL_CAPSULE | Freq: Every day | ORAL | 0 refills | Status: DC

## 2016-09-22 ENCOUNTER — Other Ambulatory Visit: Payer: Self-pay | Admitting: Family Medicine

## 2016-09-22 NOTE — Telephone Encounter (Signed)
Medication has been refilled and [facility] 

## 2016-09-30 ENCOUNTER — Other Ambulatory Visit: Payer: Self-pay | Admitting: Family Medicine

## 2016-09-30 DIAGNOSIS — F419 Anxiety disorder, unspecified: Secondary | ICD-10-CM

## 2016-10-23 ENCOUNTER — Other Ambulatory Visit: Payer: Self-pay | Admitting: Family Medicine

## 2016-11-03 ENCOUNTER — Encounter: Payer: Self-pay | Admitting: Cardiovascular Disease

## 2016-11-05 ENCOUNTER — Other Ambulatory Visit: Payer: Self-pay | Admitting: Family Medicine

## 2017-02-23 ENCOUNTER — Encounter: Payer: Self-pay | Admitting: Cardiovascular Disease

## 2017-02-23 ENCOUNTER — Ambulatory Visit (INDEPENDENT_AMBULATORY_CARE_PROVIDER_SITE_OTHER): Payer: BLUE CROSS/BLUE SHIELD | Admitting: Cardiovascular Disease

## 2017-02-23 VITALS — BP 160/90 | HR 62 | Ht 70.0 in | Wt 188.5 lb

## 2017-02-23 DIAGNOSIS — I10 Essential (primary) hypertension: Secondary | ICD-10-CM | POA: Diagnosis not present

## 2017-02-23 DIAGNOSIS — E782 Mixed hyperlipidemia: Secondary | ICD-10-CM

## 2017-02-23 DIAGNOSIS — I2510 Atherosclerotic heart disease of native coronary artery without angina pectoris: Secondary | ICD-10-CM | POA: Diagnosis not present

## 2017-02-23 MED ORDER — NEBIVOLOL HCL 5 MG PO TABS: 5.0000 mg | ORAL_TABLET | Freq: Every day | ORAL | 1 refills | Status: DC

## 2017-02-23 MED ORDER — LOSARTAN POTASSIUM 100 MG PO TABS: 100.0000 mg | ORAL_TABLET | Freq: Every day | ORAL | 1 refills | Status: DC

## 2017-02-23 NOTE — Patient Instructions (Signed)
Medication Instructions:  Your physician has recommended you make the following change in your medication:  INCREASE losartan to 100mg once daily   Labwork: none  Testing/Procedures: none  Follow-Up: Your physician wants you to follow-up in: 6 months with Dr. Arida.  You will receive a reminder letter in the mail two months in advance. If you don't receive a letter, please call our office to schedule the follow-up appointment.   Any Other Special Instructions Will Be Listed Below (If Applicable).     If you need a refill on your cardiac medications before your next appointment, please call your pharmacy.   

## 2017-02-23 NOTE — Progress Notes (Signed)
Cardiology Office Note   Date:  02/23/2017   ID:  Paul Page, DOB 1956-01-27, MRN [id number]  PCP:  Ellyn Hack, MD  Cardiologist:   Lorine Bears, MD   Chief Complaint  Patient presents with  . other    6 month follow up. Patient states he is doing well. Meds reviewed verbally with aptient.       History of Present Illness: Paul Page is a 61 y.o. male who is here today for a follow-up visit regarding coronary artery disease which has been treated at [facility] in [place].   He presented in 2008 with unstable angina. Cardiac catheterization showed severe three-vessel coronary artery disease with normal ejection fraction. He underwent successful angioplasty and three-vessel drug-eluting stent placement. He returned in 2011 with unstable angina. Repeat cardiac catheterization showed new disease in the LAD and RCA. Both of them were treated with drug-eluting stents. No cardiac events since then. He has chronic medical conditions that include hypertension, hyperlipidemia and palpitations controlled with a small dose beta blocker. He has strong family history of coronary artery disease and hyperlipidemia. He is not a smoker. He had a treadmill stress test in July, 2017 which was negative for ischemia but she had hypertensive response to exercise. He was able to exercise for 7-1/2 minutes. The dose of Bystolic was increased to 10 mg daily. However, he had worsening fatigue and the dose was decreased back to 5 mg once daily. Losartan 50 mg once daily was added. He has been doing extremely well with no chest pain, shortness of breath or palpitations. He started going to the gym regularly and lost 16 pounds since last visit. His energy level has improved significantly. He has been taking his medications regularly. He reports that his systolic blood pressure at home runs around 140 mmHg.   Past Medical History:  Diagnosis Date  . Anxiety   . Asthma   . Coronary artery disease    Three-vessel coronary artery disease diagnosed in October 2008 with normal ejection fraction. PCI and drug-eluting stent placement to the LAD(3.0 x 8 mm Promus), left circumflex(2.5 x 12 mm Promus) and right coronary artery(3.5 x 12 mm Promus). Unstable angina in 2011. PCI of RCA with a 3.5 x 15 mm Promus and PCI of LAD with a 2.5 x 28 mm Promus drug-eluting stents   . GERD (gastroesophageal reflux disease)   . Heart disease   . Hyperlipidemia   . Hypertension     Past Surgical History:  Procedure Laterality Date  . ANKLE FRACTURE SURGERY    . CARDIAC CATHETERIZATION  2011  . CARDIAC CATHETERIZATION  2008  . CORONARY STENT PLACEMENT  2008 & 2011     Current Outpatient Prescriptions  Medication Sig Dispense Refill  . ADVAIR DISKUS 100-50 MCG/DOSE AEPB INHALE 1 PUFF INTO THE LUNGS DAILY. 60 each 0  . clopidogrel (PLAVIX) 75 MG tablet Take 1 tablet (75 mg total) by mouth daily. 90 tablet 3  . escitalopram (LEXAPRO) 10 MG tablet TAKE 1 TABLET (10 MG TOTAL) BY MOUTH DAILY. 90 tablet 1  . Multiple Vitamins-Minerals (CENTRUM SILVER PO) Take 1 tablet by mouth daily.    . nebivolol (BYSTOLIC) 5 MG tablet Take 1 tablet (5 mg total) by mouth daily. 30 tablet 5  . PROAIR HFA 108 (90 Base) MCG/ACT inhaler INHALE 2 PUFFS INTO THE LUNGS EVERY 6 (SIX) HOURS AS NEEDED FOR WHEEZING OR SHORTNESS OF BREATH. 8.5 Inhaler 2  . RABEprazole (ACIPHEX) 20 MG tablet  TAKE 1 TABLET (20 MG TOTAL) BY MOUTH DAILY. 90 tablet 1  . rosuvastatin (CRESTOR) 20 MG tablet TAKE 1 TABLET (20 MG TOTAL) BY MOUTH DAILY. 90 tablet 1  . losartan (COZAAR) 50 MG tablet Take 1 tablet (50 mg total) by mouth daily. 30 tablet 5   No current facility-administered medications for this visit.     Allergies:   Fish oil; Peanut oil; and Tetracyclines & related    Social History:  The patient  reports that he has never smoked. He has never used smokeless tobacco. He reports that he drinks alcohol. He reports that he does not use drugs.    Family History:  The patient's family history includes Cancer in his father; Parkinson's disease in his mother.  Family history of coronary artery disease and hyperlipidemia.   ROS:  Please see the history of present illness.   Otherwise, review of systems are positive for none.   All other systems are reviewed and negative.    PHYSICAL EXAM: VS:  BP (!) 160/90 (BP Location: Left Arm, Patient Position: Sitting, Cuff Size: Normal)   Pulse 62   Ht 5\' 10"  (1.778 m)   Wt 188 lb 8 oz (85.5 kg)   BMI 27.05 kg/m  , BMI Body mass index is 27.05 kg/m. GEN: Well nourished, well developed, in no acute distress  HEENT: normal  Neck: no JVD, carotid bruits, or masses Cardiac: RRR; no murmurs, rubs, or gallops,no edema  Respiratory:  clear to auscultation bilaterally, normal work of breathing GI: soft, nontender, nondistended, + BS MS: no deformity or atrophy  Skin: warm and dry, no rash Neuro:  Strength and sensation are intact Psych: euthymic mood, full affect   EKG:  EKG is ordered today. EKG showed normal sinus rhythm with no significant ST or T wave changes.   Recent Labs: 08/04/2016: Hemoglobin 15.6; Platelets 273 09/05/2016: ALT 34; BUN 14; Creatinine, Ser 0.99; Potassium 4.3; Sodium 144    Lipid Panel    Component Value Date/Time   CHOL 206 (H) 09/05/2016 0848   TRIG 397 (H) 09/05/2016 0848   HDL 46 09/05/2016 0848   CHOLHDL 4.5 09/05/2016 0848   LDLCALC 81 09/05/2016 0848      Wt Readings from Last 3 Encounters:  02/23/17 188 lb 8 oz (85.5 kg)  08/29/16 201 lb 4 oz (91.3 kg)  08/04/16 199 lb 3.2 oz (90.4 kg)        ASSESSMENT AND PLAN:  1. Coronary artery disease involving native coronary arteries: He is doing well with no anginal symptoms.  Continue lifelong dual antiplatelet therapy. He reports that he has not been taking low-dose aspirin and a regular basis due to frequent nosebleeds. I advised him to try to do so. He takes Plavix regularly.   2.  Hyperlipidemia:  Most recent lipid profile in December showed a total cholesterol of 206, triglyceride of 397 and LDL of 81 which has improved from before. We added fish oil but he developed a rash. Given his improved diet and healthy exercise, I suspect that his triglyceride is likely down. Continue rosuvastatin for now.   3. Essential hypertension: I repeated his blood pressure was 142/78. I elected to increase losartan to 100 mg once daily.   3. Palpitations: Controlled with small dose Bystolic.    Disposition:   FU with me in 6 months  Signed,  Lorine BearsMuhammad Latresha Yahr, MD  02/23/2017 9:50 AM    [facility]

## 2017-03-03 ENCOUNTER — Other Ambulatory Visit: Payer: Self-pay | Admitting: Cardiovascular Disease

## 2017-03-05 ENCOUNTER — Other Ambulatory Visit: Payer: Self-pay | Admitting: Family Medicine

## 2017-03-05 DIAGNOSIS — K219 Gastro-esophageal reflux disease without esophagitis: Secondary | ICD-10-CM

## 2017-03-05 DIAGNOSIS — J4540 Moderate persistent asthma, uncomplicated: Secondary | ICD-10-CM

## 2017-03-06 ENCOUNTER — Other Ambulatory Visit: Payer: Self-pay | Admitting: Family Medicine

## 2017-03-06 DIAGNOSIS — K219 Gastro-esophageal reflux disease without esophagitis: Secondary | ICD-10-CM

## 2017-03-07 ENCOUNTER — Emergency Department
Admission: EM | Admit: 2017-03-07 | Discharge: 2017-03-07 | Disposition: A | Discharge status: Home / Self Care | Payer: BLUE CROSS/BLUE SHIELD | Attending: Student in an Organized Health Care Education/Training Program | Admitting: Student in an Organized Health Care Education/Training Program

## 2017-03-07 ENCOUNTER — Encounter: Payer: Self-pay | Admitting: Family Medicine

## 2017-03-07 ENCOUNTER — Telehealth: Payer: Self-pay | Admitting: Family Medicine

## 2017-03-07 ENCOUNTER — Other Ambulatory Visit: Payer: Self-pay | Admitting: Family Medicine

## 2017-03-07 ENCOUNTER — Encounter: Payer: Self-pay | Admitting: Medical Oncology

## 2017-03-07 ENCOUNTER — Emergency Department: Payer: BLUE CROSS/BLUE SHIELD

## 2017-03-07 DIAGNOSIS — W1839XA Other fall on same level, initial encounter: Secondary | ICD-10-CM | POA: Insufficient documentation

## 2017-03-07 DIAGNOSIS — S99912A Unspecified injury of left ankle, initial encounter: Secondary | ICD-10-CM | POA: Diagnosis present

## 2017-03-07 DIAGNOSIS — J45909 Unspecified asthma, uncomplicated: Secondary | ICD-10-CM | POA: Insufficient documentation

## 2017-03-07 DIAGNOSIS — J4540 Moderate persistent asthma, uncomplicated: Secondary | ICD-10-CM

## 2017-03-07 DIAGNOSIS — Y929 Unspecified place or not applicable: Secondary | ICD-10-CM | POA: Diagnosis not present

## 2017-03-07 DIAGNOSIS — I10 Essential (primary) hypertension: Secondary | ICD-10-CM | POA: Diagnosis not present

## 2017-03-07 DIAGNOSIS — Y9389 Activity, other specified: Secondary | ICD-10-CM | POA: Diagnosis not present

## 2017-03-07 DIAGNOSIS — K219 Gastro-esophageal reflux disease without esophagitis: Secondary | ICD-10-CM

## 2017-03-07 DIAGNOSIS — S93402A Sprain of unspecified ligament of left ankle, initial encounter: Secondary | ICD-10-CM | POA: Insufficient documentation

## 2017-03-07 DIAGNOSIS — I2510 Atherosclerotic heart disease of native coronary artery without angina pectoris: Secondary | ICD-10-CM | POA: Insufficient documentation

## 2017-03-07 DIAGNOSIS — Y999 Unspecified external cause status: Secondary | ICD-10-CM | POA: Diagnosis not present

## 2017-03-07 MED ORDER — MELOXICAM 15 MG PO TABS: 15.0000 mg | ORAL_TABLET | Freq: Every day | ORAL | 0 refills | Status: DC

## 2017-03-07 MED ORDER — FLUTICASONE-SALMETEROL 100-50 MCG/DOSE IN AEPB: 1.0000 | INHALATION_SPRAY | Freq: Every day | RESPIRATORY_TRACT | 0 refills | Status: DC

## 2017-03-07 MED ORDER — RABEPRAZOLE SODIUM 20 MG PO TBEC: 20.0000 mg | DELAYED_RELEASE_TABLET | Freq: Every day | ORAL | 1 refills | Status: DC

## 2017-03-07 NOTE — ED Provider Notes (Signed)
[facility] Emergency Department Provider Note  ____________________________________________  Time seen: Approximately 3:48 PM  I have reviewed the triage vital signs and the nursing notes.   HISTORY  Chief Complaint Ankle Pain    HPI Paul Page is a 61 y.o. male who presents to emergency room complaining of left ankle pain. Patient reports that he is working at his house when he stepped off an elevated patio to his left ankle. Patient reports that his ankle "gave away". Patient reports that that sensation did cause him to fall to his knees. He noticed immediate swelling to the left lateral ankle. He has been completely nonweightbearing since injury. He immediately applied ice to the area. Patient reports that he is on warfarin but did not hit his head or lose consciousness at any time. He denies any other injury or complaint. No medications prior to arrival. Pain is minimal at this time with exception of direct pressure over the injured area which does increase the pain and it is described as sharp. No numbness or tingling in his toes.   Past Medical History:  Diagnosis Date  . Anxiety   . Asthma   . Coronary artery disease    Three-vessel coronary artery disease diagnosed in October 2008 with normal ejection fraction. PCI and drug-eluting stent placement to the LAD(3.0 x 8 mm Promus), left circumflex(2.5 x 12 mm Promus) and right coronary artery(3.5 x 12 mm Promus). Unstable angina in 2011. PCI of RCA with a 3.5 x 15 mm Promus and PCI of LAD with a 2.5 x 28 mm Promus drug-eluting stents   . GERD (gastroesophageal reflux disease)   . Heart disease   . Hyperlipidemia   . Hypertension     Patient Active Problem List   Diagnosis Date Noted  . Abdominal pain, RLQ 08/04/2016  . Abdominal wall pain in right lower quadrant 03/03/2016  . Hypertension 04/12/2015  . Coronary artery disease 04/12/2015  . Dyslipidemia 03/22/2015  . GERD (gastroesophageal reflux  disease) 03/22/2015  . Asthma 03/22/2015  . Anxiety 03/22/2015  . Heart disease     Past Surgical History:  Procedure Laterality Date  . ANKLE FRACTURE SURGERY    . CARDIAC CATHETERIZATION  2011  . CARDIAC CATHETERIZATION  2008  . CORONARY STENT PLACEMENT  2008 & 2011    Prior to Admission medications   Medication Sig Start Date End Date Taking? Authorizing Provider  clopidogrel (PLAVIX) 75 MG tablet Take 1 tablet (75 mg total) by mouth daily. 03/03/16   Ellyn Hack, MD  escitalopram (LEXAPRO) 10 MG tablet TAKE 1 TABLET (10 MG TOTAL) BY MOUTH DAILY. 10/05/16   Ellyn Hack, MD  Fluticasone-Salmeterol (ADVAIR DISKUS) 100-50 MCG/DOSE AEPB Inhale 1 puff into the lungs daily. 03/07/17   Ellyn Hack, MD  losartan (COZAAR) 100 MG tablet Take 1 tablet (100 mg total) by mouth daily. 02/23/17 05/24/17  Iran Ouch, MD  losartan (COZAAR) 50 MG tablet TAKE 1 TABLET (50 MG TOTAL) BY MOUTH DAILY. 03/05/17 06/03/17  Iran Ouch, MD  meloxicam (MOBIC) 15 MG tablet Take 1 tablet (15 mg total) by mouth daily. 03/07/17   Bryer Gottsch, Delorise Royals, PA-C  Multiple Vitamins-Minerals (CENTRUM SILVER PO) Take 1 tablet by mouth daily. 01/14/1978   [provider]  nebivolol (BYSTOLIC) 5 MG tablet Take 1 tablet (5 mg total) by mouth daily. 02/23/17   Iran Ouch, MD  PROAIR HFA 108 [phone] Base) MCG/ACT inhaler INHALE 2 PUFFS INTO THE  LUNGS EVERY 6 (SIX) HOURS AS NEEDED FOR WHEEZING OR SHORTNESS OF BREATH. 11/06/16   Ellyn Hack, MD  RABEprazole (ACIPHEX) 20 MG tablet Take 1 tablet (20 mg total) by mouth daily. 03/07/17   Ellyn Hack, MD  rosuvastatin (CRESTOR) 20 MG tablet TAKE 1 TABLET (20 MG TOTAL) BY MOUTH DAILY. 10/23/16   Ellyn Hack, MD    Allergies Fish oil; Peanut oil; and Tetracyclines & related  Family History  Problem Relation Age of Onset  . Parkinson's disease Mother   . Cancer Father     Social History Social History  Substance Use Topics  . Smoking  status: Never Smoker  . Smokeless tobacco: Never Used  . Alcohol use 0.0 oz/week     Comment: occasional wine     Review of Systems  Constitutional: No fever/chills Eyes: No visual changes.  Cardiovascular: no chest pain. Respiratory: no cough. No SOB. Gastrointestinal: No abdominal pain.  No nausea, no vomiting.   Musculoskeletal: Positive for left lateral ankle pain Skin: Negative for rash, abrasions, lacerations, ecchymosis. Neurological: Negative for headaches, focal weakness or numbness. 10-point ROS otherwise negative.  ____________________________________________   PHYSICAL EXAM:  VITAL SIGNS: ED Triage Vitals  Enc Vitals Group     BP 03/07/17 1504 117/84     Pulse Rate 03/07/17 1504 89     Resp 03/07/17 1504 18     Temp 03/07/17 1504 97.7 F (36.5 C)     Temp Source 03/07/17 1504 Oral     SpO2 03/07/17 1504 95 %     Weight 03/07/17 1507 188 lb (85.3 kg)     Height 03/07/17 1507 5\' 10"  (1.778 m)     Head Circumference --      Peak Flow --      Pain Score 03/07/17 1504 6     Pain Loc --      Pain Edu? --      Excl. in GC? --      Constitutional: Alert and oriented. Well appearing and in no acute distress. Eyes: Conjunctivae are normal. PERRL. EOMI. Head: Atraumatic. Neck: No stridor.    Cardiovascular: Normal rate, regular rhythm. Normal S1 and S2.  Good peripheral circulation. Respiratory: Normal respiratory effort without tachypnea or retractions. Lungs CTAB. Good air entry to the bases with no decreased or absent breath sounds. Musculoskeletal: Full range of motion to all extremities. No gross deformities appreciated.Significant edema is noted to the left lateral malleolus. No deformity noted. No ecchymosis. Patient has limited range of motion to the left ankle. Patient is tender to palpation over the left lateral malleolus. No other tenderness to palpation. Dorsalis pedis pulse intact. Sensation intact 5 digits. Examination of the knee is  unremarkable. Neurologic:  Normal speech and language. No gross focal neurologic deficits are appreciated.  Skin:  Skin is warm, dry and intact. No rash noted. Psychiatric: Mood and affect are normal. Speech and behavior are normal. Patient exhibits appropriate insight and judgement.   ____________________________________________   LABS (all labs ordered are listed, but only abnormal results are displayed)  Labs Reviewed - No data to display ____________________________________________  EKG   ____________________________________________  RADIOLOGY Festus Barren Penny Frisbie, personally viewed and evaluated these images (plain radiographs) as part of my medical decision making, as well as reviewing the written report by the radiologist.  Dg Ankle Complete Left  Result Date: 03/07/2017 CLINICAL DATA:  Lateral malleolar pain EXAM: LEFT ANKLE COMPLETE - 3+ VIEW COMPARISON:  None. FINDINGS:  No acute fracture or dislocation. Single medial trans malleolar screw transfixing a healed fracture. No aggressive osseous lesion. Plantar calcaneal spur. Severe soft tissue swelling over the lateral malleolus. Small ankle joint effusion. IMPRESSION: 1.  No acute osseous injury of the left ankle. Electronically Signed   By: Elige KoHetal  Patel   On: 03/07/2017 16:18    ____________________________________________    PROCEDURES  Procedure(s) performed:    Procedures    Medications - No data to display   ____________________________________________   INITIAL IMPRESSION / ASSESSMENT AND PLAN / ED COURSE  Pertinent labs & imaging results that were available during my care of the patient were reviewed by me and considered in my medical decision making (see chart for details).  Review of the [place] CSRS was performed in accordance of the [facility] prior to dispensing any controlled drugs.     Patient's diagnosis is consistent with left ankle sprain. X-ray reveals no acute osseous abnormality. Exam is  consistent with left ankle sprain. Ace bandage applied. Patient is given crutches. Patient follow-up with orthopedics as needed. Patient will be discharged home with prescriptions for meloxicam. Patient is given ED precautions to return to the ED for any worsening or new symptoms.     ____________________________________________  FINAL CLINICAL IMPRESSION(S) / ED DIAGNOSES  Final diagnoses:  Sprain of left ankle, unspecified ligament, initial encounter      NEW MEDICATIONS STARTED DURING THIS VISIT:  New Prescriptions   MELOXICAM (MOBIC) 15 MG TABLET    Take 1 tablet (15 mg total) by mouth daily.        This chart was dictated using voice recognition software/Dragon. Despite best efforts to proofread, errors can occur which can change the meaning. Any change was purely unintentional.    Racheal PatchesCuthriell, Lestine Rahe D, PA-C 03/07/17 1701    Emily FilbertWilliams, Lajune Perine E, MD 03/07/17 [phone]

## 2017-03-07 NOTE — ED Notes (Signed)
Pt presents with left leg pain; was moving ladder and "ran out of patio." Pt's left leg dropped off patio and landed in a painful manner. Pt states he heard an audible pop. Pt alert & oriented with NAD noted.

## 2017-03-07 NOTE — Telephone Encounter (Signed)
Pt has scheduled an appointment for tomorrow. He is asking that you please send a refill on advair to [facility] [address] today due to his asthma  Darel HongJudy (wife)[phone]

## 2017-03-07 NOTE — ED Notes (Signed)
Pt discharged home after verbalizing understanding of discharge instructions; nad noted. 

## 2017-03-08 ENCOUNTER — Ambulatory Visit: Payer: BLUE CROSS/BLUE SHIELD | Admitting: Family Medicine

## 2017-03-21 ENCOUNTER — Encounter: Payer: Self-pay | Admitting: Family Medicine

## 2017-03-21 ENCOUNTER — Ambulatory Visit (INDEPENDENT_AMBULATORY_CARE_PROVIDER_SITE_OTHER): Payer: BLUE CROSS/BLUE SHIELD | Admitting: Family Medicine

## 2017-03-21 VITALS — BP 120/77 | HR 80 | Temp 97.4°F | Resp 16 | Ht 70.0 in | Wt 191.0 lb

## 2017-03-21 DIAGNOSIS — I2510 Atherosclerotic heart disease of native coronary artery without angina pectoris: Secondary | ICD-10-CM

## 2017-03-21 DIAGNOSIS — J4540 Moderate persistent asthma, uncomplicated: Secondary | ICD-10-CM | POA: Diagnosis not present

## 2017-03-21 DIAGNOSIS — E785 Hyperlipidemia, unspecified: Secondary | ICD-10-CM

## 2017-03-21 DIAGNOSIS — J0100 Acute maxillary sinusitis, unspecified: Secondary | ICD-10-CM | POA: Diagnosis not present

## 2017-03-21 MED ORDER — FLUTICASONE-SALMETEROL 100-50 MCG/DOSE IN AEPB: 1.0000 | INHALATION_SPRAY | Freq: Every day | RESPIRATORY_TRACT | 0 refills | Status: DC

## 2017-03-21 MED ORDER — ROSUVASTATIN CALCIUM 20 MG PO TABS: 20.0000 mg | ORAL_TABLET | Freq: Every day | ORAL | 1 refills | Status: DC

## 2017-03-21 MED ORDER — CLOPIDOGREL BISULFATE 75 MG PO TABS: 75.0000 mg | ORAL_TABLET | Freq: Every day | ORAL | 1 refills | Status: DC

## 2017-03-21 MED ORDER — AZITHROMYCIN 250 MG PO TABS: ORAL_TABLET | ORAL | 0 refills | Status: DC

## 2017-03-21 NOTE — Progress Notes (Signed)
Name: Paul Page   MRN: [id number]    DOB: [birth year]   Date:03/21/2017       Progress Note  Subjective  Chief Complaint  Chief Complaint  Patient presents with  . Medication Refill    Sinusitis  This is a recurrent problem. The current episode started 1 to 4 weeks ago (10 days ago). The problem is unchanged. There has been no fever. His pain is at a severity of 5/10. Associated symptoms include congestion, sinus pressure and sneezing. Pertinent negatives include no coughing, shortness of breath or sore throat. Past treatments include oral decongestants. The treatment provided moderate relief.  Hyperlipidemia  This is a chronic problem. The problem is uncontrolled. Recent lipid tests were reviewed and are high. Pertinent negatives include no leg pain, myalgias or shortness of breath. Current antihyperlipidemic treatment includes statins.  Asthma  There is no chest tightness, cough, shortness of breath or sputum production. This is a chronic problem. The problem has been unchanged. Associated symptoms include sneezing. Pertinent negatives include no fever, myalgias or sore throat. His symptoms are alleviated by steroid inhaler. His past medical history is significant for asthma.     Past Medical History:  Diagnosis Date  . Anxiety   . Asthma   . Coronary artery disease    Three-vessel coronary artery disease diagnosed in October 2008 with normal ejection fraction. PCI and drug-eluting stent placement to the LAD(3.0 x 8 mm Promus), left circumflex(2.5 x 12 mm Promus) and right coronary artery(3.5 x 12 mm Promus). Unstable angina in 2011. PCI of RCA with a 3.5 x 15 mm Promus and PCI of LAD with a 2.5 x 28 mm Promus drug-eluting stents   . GERD (gastroesophageal reflux disease)   . Heart disease   . Hyperlipidemia   . Hypertension     Past Surgical History:  Procedure Laterality Date  . ANKLE FRACTURE SURGERY    . CARDIAC CATHETERIZATION  2011  . CARDIAC CATHETERIZATION  2008  .  CORONARY STENT PLACEMENT  2008 & 2011    Family History  Problem Relation Age of Onset  . Parkinson's disease Mother   . Cancer Father     Social History   Social History  . Marital status: Married    Spouse name: N/A  . Number of children: N/A  . Years of education: N/A   Occupational History  . Not on file.   Social History Main Topics  . Smoking status: Never Smoker  . Smokeless tobacco: Never Used  . Alcohol use 0.0 oz/week     Comment: occasional wine  . Drug use: No  . Sexual activity: Yes   Other Topics Concern  . Not on file   Social History Narrative  . No narrative on file     Current Outpatient Prescriptions:  .  clopidogrel (PLAVIX) 75 MG tablet, Take 1 tablet (75 mg total) by mouth daily., Disp: 90 tablet, Rfl: 3 .  escitalopram (LEXAPRO) 10 MG tablet, TAKE 1 TABLET (10 MG TOTAL) BY MOUTH DAILY., Disp: 90 tablet, Rfl: 1 .  Fluticasone-Salmeterol (ADVAIR DISKUS) 100-50 MCG/DOSE AEPB, Inhale 1 puff into the lungs daily., Disp: 60 each, Rfl: 0 .  losartan (COZAAR) 100 MG tablet, Take 1 tablet (100 mg total) by mouth daily., Disp: 90 tablet, Rfl: 1 .  losartan (COZAAR) 50 MG tablet, TAKE 1 TABLET (50 MG TOTAL) BY MOUTH DAILY., Disp: 30 tablet, Rfl: 3 .  meloxicam (MOBIC) 15 MG tablet, Take 1 tablet (15 mg total) by mouth daily.,  Disp: 30 tablet, Rfl: 0 .  Multiple Vitamins-Minerals (CENTRUM SILVER PO), Take 1 tablet by mouth daily., Disp: , Rfl:  .  nebivolol (BYSTOLIC) 5 MG tablet, Take 1 tablet (5 mg total) by mouth daily., Disp: 90 tablet, Rfl: 1 .  PROAIR HFA 108 (90 Base) MCG/ACT inhaler, INHALE 2 PUFFS INTO THE LUNGS EVERY 6 (SIX) HOURS AS NEEDED FOR WHEEZING OR SHORTNESS OF BREATH., Disp: 8.5 Inhaler, Rfl: 2 .  RABEprazole (ACIPHEX) 20 MG tablet, Take 1 tablet (20 mg total) by mouth daily., Disp: 90 tablet, Rfl: 1 .  rosuvastatin (CRESTOR) 20 MG tablet, TAKE 1 TABLET (20 MG TOTAL) BY MOUTH DAILY., Disp: 90 tablet, Rfl: 1  Allergies  Allergen Reactions   . Fish Oil Hives  . Peanut Oil   . Tetracyclines & Related Other (See Comments)    Intolerance - stomach upset     Review of Systems  Constitutional: Negative for fever.  HENT: Positive for congestion, sinus pressure and sneezing. Negative for sore throat.   Respiratory: Negative for cough, sputum production and shortness of breath.   Musculoskeletal: Negative for myalgias.     Objective  Vitals:   03/21/17 1143  BP: 120/77  Pulse: 80  Resp: 16  Temp: 97.4 F (36.3 C)  TempSrc: Oral  SpO2: 95%  Weight: 191 lb (86.6 kg)  Height: 5\' 10"  (1.778 m)    Physical Exam  Constitutional: He is oriented to person, place, and time and well-developed, well-nourished, and in no distress.  HENT:  Left Ear: Tympanic membrane and ear canal normal.  Nose: Right sinus exhibits maxillary sinus tenderness. Right sinus exhibits no frontal sinus tenderness. Left sinus exhibits maxillary sinus tenderness. Left sinus exhibits no frontal sinus tenderness.  Mouth/Throat: Posterior oropharyngeal erythema present.  Right ear canal with cerumen  Cardiovascular: Normal rate, regular rhythm, S1 normal, S2 normal and normal heart sounds.   No murmur heard. Pulmonary/Chest: Effort normal and breath sounds normal. He has no wheezes.  Abdominal: Soft. Bowel sounds are normal. There is no tenderness.  Musculoskeletal: He exhibits no edema.  Neurological: He is alert and oriented to person, place, and time.  Psychiatric: Mood, memory, affect and judgment normal.  Nursing note and vitals reviewed.     Assessment & Plan   1. Moderate persistent asthma without complication Stable on Advair taken every day - Fluticasone-Salmeterol (ADVAIR DISKUS) 100-50 MCG/DOSE AEPB; Inhale 1 puff into the lungs daily.  Dispense: 60 each; Refill: 0  2. Coronary artery disease involving native coronary artery of native heart without angina pectoris Also being followed by cardiology, continue on Plavix - clopidogrel  (PLAVIX) 75 MG tablet; Take 1 tablet (75 mg total) by mouth daily.  Dispense: 90 tablet; Refill: 1  3. Dyslipidemia  - rosuvastatin (CRESTOR) 20 MG tablet; Take 1 tablet (20 mg total) by mouth daily.  Dispense: 90 tablet; Refill: 1  4. Acute non-recurrent maxillary sinusitis  - azithromycin (ZITHROMAX) 250 MG tablet; 2 tabs po day 1, then 1 tab po q day x 4 days  Dispense: 6 tablet; Refill: 0   Smt Lokey Asad A. Faylene KurtzShah [facility] 03/21/2017 12:01 PM

## 2017-04-05 ENCOUNTER — Other Ambulatory Visit: Payer: Self-pay | Admitting: Family Medicine

## 2017-04-05 DIAGNOSIS — F419 Anxiety disorder, unspecified: Secondary | ICD-10-CM

## 2017-05-09 ENCOUNTER — Other Ambulatory Visit: Payer: Self-pay | Admitting: Family Medicine

## 2017-05-09 DIAGNOSIS — J4540 Moderate persistent asthma, uncomplicated: Secondary | ICD-10-CM

## 2017-05-14 ENCOUNTER — Other Ambulatory Visit: Payer: Self-pay | Admitting: Family Medicine

## 2017-05-14 DIAGNOSIS — J4540 Moderate persistent asthma, uncomplicated: Secondary | ICD-10-CM

## 2017-05-14 NOTE — Telephone Encounter (Signed)
Refilled by Dr. Carlynn PurlSowles on 05/10/2017. Too soon for refill.

## 2017-05-16 ENCOUNTER — Encounter: Payer: Self-pay | Admitting: Family Medicine

## 2017-05-16 ENCOUNTER — Ambulatory Visit (INDEPENDENT_AMBULATORY_CARE_PROVIDER_SITE_OTHER): Payer: BLUE CROSS/BLUE SHIELD | Admitting: Family Medicine

## 2017-05-16 VITALS — BP 124/73 | HR 82 | Temp 98.2°F | Resp 16 | Ht 70.0 in | Wt 197.4 lb

## 2017-05-16 DIAGNOSIS — J4540 Moderate persistent asthma, uncomplicated: Secondary | ICD-10-CM

## 2017-05-16 DIAGNOSIS — Z1211 Encounter for screening for malignant neoplasm of colon: Secondary | ICD-10-CM | POA: Diagnosis not present

## 2017-05-16 DIAGNOSIS — Z1159 Encounter for screening for other viral diseases: Secondary | ICD-10-CM

## 2017-05-16 DIAGNOSIS — Z0000 Encounter for general adult medical examination without abnormal findings: Secondary | ICD-10-CM | POA: Diagnosis not present

## 2017-05-16 DIAGNOSIS — Z125 Encounter for screening for malignant neoplasm of prostate: Secondary | ICD-10-CM

## 2017-05-16 DIAGNOSIS — I2510 Atherosclerotic heart disease of native coronary artery without angina pectoris: Secondary | ICD-10-CM | POA: Diagnosis not present

## 2017-05-16 LAB — CBC WITH DIFFERENTIAL/PLATELET
BASOS ABS: 54 {cells}/uL (ref 0–200)
BASOS PCT: 1 %
EOS ABS: 432 {cells}/uL (ref 15–500)
Eosinophils Relative: 8 %
HEMATOCRIT: 44.7 % (ref 38.5–50.0)
HEMOGLOBIN: 15.2 g/dL (ref 13.2–17.1)
LYMPHS ABS: 1188 {cells}/uL (ref 850–3900)
Lymphocytes Relative: 22 %
MCH: 32.7 pg (ref 27.0–33.0)
MCHC: 34 g/dL (ref 32.0–36.0)
MCV: 96.1 fL (ref 80.0–100.0)
MONO ABS: 486 {cells}/uL (ref 200–950)
MPV: 9.2 fL (ref 7.5–12.5)
Monocytes Relative: 9 %
NEUTROS ABS: 3240 {cells}/uL (ref 1500–7800)
Neutrophils Relative %: 60 %
Platelets: 288 10*3/uL (ref 140–400)
RBC: 4.65 MIL/uL (ref 4.20–5.80)
RDW: 13.5 % (ref 11.0–15.0)
WBC: 5.4 10*3/uL (ref 3.8–10.8)

## 2017-05-16 LAB — TSH: TSH: 1.72 mIU/L (ref 0.40–4.50)

## 2017-05-16 MED ORDER — FLUTICASONE-SALMETEROL 100-50 MCG/DOSE IN AEPB: INHALATION_SPRAY | RESPIRATORY_TRACT | 0 refills | Status: DC

## 2017-05-16 MED ORDER — CLOPIDOGREL BISULFATE 75 MG PO TABS: 75.0000 mg | ORAL_TABLET | Freq: Every day | ORAL | 1 refills | Status: DC

## 2017-05-16 MED ORDER — ALBUTEROL SULFATE HFA 108 (90 BASE) MCG/ACT IN AERS: INHALATION_SPRAY | RESPIRATORY_TRACT | 2 refills | Status: DC

## 2017-05-16 NOTE — Progress Notes (Signed)
Name: Paul Page   MRN: [id number]    DOB: May 15, 1956   Date:05/16/2017       Progress Note  Subjective  Chief Complaint  Chief Complaint  Patient presents with  . Annual Exam    CPE    HPI  Pt. presents for Annual Physical Exam.  His colonoscopy was in January 2014, was done in [place], records not available.  He is due for prostate cancer screening and Hepatitis C screening.    Past Medical History:  Diagnosis Date  . Anxiety   . Asthma   . Coronary artery disease    Three-vessel coronary artery disease diagnosed in October 2008 with normal ejection fraction. PCI and drug-eluting stent placement to the LAD(3.0 x 8 mm Promus), left circumflex(2.5 x 12 mm Promus) and right coronary artery(3.5 x 12 mm Promus). Unstable angina in 2011. PCI of RCA with a 3.5 x 15 mm Promus and PCI of LAD with a 2.5 x 28 mm Promus drug-eluting stents   . GERD (gastroesophageal reflux disease)   . Heart disease   . Hyperlipidemia   . Hypertension     Past Surgical History:  Procedure Laterality Date  . ANKLE FRACTURE SURGERY    . CARDIAC CATHETERIZATION  2011  . CARDIAC CATHETERIZATION  2008  . CORONARY STENT PLACEMENT  2008 & 2011    Family History  Problem Relation Age of Onset  . Parkinson's disease Mother   . Cancer Father     Social History   Social History  . Marital status: Married    Spouse name: N/A  . Number of children: N/A  . Years of education: N/A   Occupational History  . Not on file.   Social History Main Topics  . Smoking status: Never Smoker  . Smokeless tobacco: Never Used  . Alcohol use 0.0 oz/week     Comment: occasional wine  . Drug use: No  . Sexual activity: Yes   Other Topics Concern  . Not on file   Social History Narrative  . No narrative on file     Current Outpatient Prescriptions:  .  ADVAIR DISKUS 100-50 MCG/DOSE AEPB, TAKE 1 PUFF BY MOUTH EVERY DAY, Disp: 60 each, Rfl: 0 .  clopidogrel (PLAVIX) 75 MG tablet, Take 1 tablet (75 mg  total) by mouth daily., Disp: 90 tablet, Rfl: 1 .  escitalopram (LEXAPRO) 10 MG tablet, TAKE 1 TABLET (10 MG TOTAL) BY MOUTH DAILY., Disp: 90 tablet, Rfl: 1 .  losartan (COZAAR) 50 MG tablet, TAKE 1 TABLET (50 MG TOTAL) BY MOUTH DAILY., Disp: 30 tablet, Rfl: 3 .  Multiple Vitamins-Minerals (CENTRUM SILVER PO), Take 1 tablet by mouth daily., Disp: , Rfl:  .  nebivolol (BYSTOLIC) 5 MG tablet, Take 1 tablet (5 mg total) by mouth daily., Disp: 90 tablet, Rfl: 1 .  PROAIR HFA 108 (90 Base) MCG/ACT inhaler, INHALE 2 PUFFS INTO THE LUNGS EVERY 6 (SIX) HOURS AS NEEDED FOR WHEEZING OR SHORTNESS OF BREATH., Disp: 8.5 Inhaler, Rfl: 2 .  RABEprazole (ACIPHEX) 20 MG tablet, Take 1 tablet (20 mg total) by mouth daily., Disp: 90 tablet, Rfl: 1 .  rosuvastatin (CRESTOR) 20 MG tablet, Take 1 tablet (20 mg total) by mouth daily., Disp: 90 tablet, Rfl: 1 .  azithromycin (ZITHROMAX) 250 MG tablet, 2 tabs po day 1, then 1 tab po q day x 4 days (Patient not taking: Reported on 05/16/2017), Disp: 6 tablet, Rfl: 0 .  meloxicam (MOBIC) 15 MG tablet, Take 1 tablet (15  mg total) by mouth daily. (Patient not taking: Reported on 05/16/2017), Disp: 30 tablet, Rfl: 0  Allergies  Allergen Reactions  . Fish Oil Hives  . Peanut Oil   . Tetracyclines & Related Other (See Comments)    Intolerance - stomach upset     Review of Systems  Constitutional: Negative for chills, fever, malaise/fatigue and weight loss.  HENT: Negative for congestion, sinus pain and sore throat.   Eyes: Negative for blurred vision and double vision.  Respiratory: Positive for sputum production (with sinus drainage and allergies.). Negative for cough and shortness of breath.   Cardiovascular: Negative for chest pain, palpitations and leg swelling.  Gastrointestinal: Negative for abdominal pain, blood in stool, nausea and vomiting.  Genitourinary: Negative for dysuria and hematuria.  Musculoskeletal: Negative for back pain and neck pain.  Neurological:  Negative for dizziness and headaches.  Psychiatric/Behavioral: Negative for depression (takes medication for depression. ). The patient is not nervous/anxious and does not have insomnia.     Objective  Vitals:   05/16/17 0907  BP: 124/73  Pulse: 82  Resp: 16  Temp: 98.2 F (36.8 C)  TempSrc: Oral  SpO2: 94%  Weight: 197 lb 6.4 oz (89.5 kg)  Height: 5\' 10"  (1.778 m)    Physical Exam  Constitutional: He is oriented to person, place, and time and well-developed, well-nourished, and in no distress.  HENT:  Head: Normocephalic and atraumatic.  Right Ear: External ear normal.  Left Ear: External ear normal.  Mouth/Throat: Posterior oropharyngeal edema and posterior oropharyngeal erythema present.  Cardiovascular: Normal rate, regular rhythm and normal heart sounds.   No murmur heard. Pulmonary/Chest: Effort normal and breath sounds normal. He has no wheezes.  Abdominal: Soft. Bowel sounds are normal. There is no tenderness.  Genitourinary: Rectum normal. Rectal exam shows no external hemorrhoid and no internal hemorrhoid. Prostate is enlarged. Prostate is not tender.  Musculoskeletal: He exhibits no edema.       Right ankle: He exhibits no swelling.       Left ankle: He exhibits no swelling.  Neurological: He is alert and oriented to person, place, and time.  Skin: Skin is warm, dry and intact.  Psychiatric: Mood, memory, affect and judgment normal.  Nursing note and vitals reviewed.     Assessment & Plan  1. Annual physical exam Obtain age-appropriate laboratory screenings - CBC with Differential/Platelet - TSH - VITAMIN D 25 Hydroxy (Vit-D Deficiency, Fractures)  2. Screening for prostate cancer  - PSA  3. Screening for colon cancer [facility] would [facility]  4. Need for hepatitis C screening test  - Hepatitis C antibody  5. Coronary artery disease involving native coronary artery of native heart without angina pectoris  -  clopidogrel (PLAVIX) 75 MG tablet; Take 1 tablet (75 mg total) by mouth daily.  Dispense: 90 tablet; Refill: 1  6. Moderate persistent asthma without complication  - Fluticasone-Salmeterol (ADVAIR DISKUS) 100-50 MCG/DOSE AEPB; TAKE 1 PUFF BY MOUTH EVERY DAY  Dispense: 90 each; Refill: 0 - albuterol (PROAIR HFA) 108 (90 Base) MCG/ACT inhaler; INHALE 2 PUFFS INTO THE LUNGS EVERY 6 (SIX) HOURS AS NEEDED FOR WHEEZING OR SHORTNESS OF BREATH.  Dispense: 4 Inhaler; Refill: 2   Viktorya Arguijo Asad A. Faylene KurtzShah [facility] 05/16/2017 9:19 AM

## 2017-05-17 LAB — PSA: PSA: 0.8 ng/mL (ref <=4.0)

## 2017-05-17 LAB — VITAMIN D 25 HYDROXY (VIT D DEFICIENCY, FRACTURES): VIT D 25 HYDROXY: 28 ng/mL — AB (ref 30–100)

## 2017-05-17 LAB — HEPATITIS C ANTIBODY: HCV Ab: NONREACTIVE

## 2017-05-19 ENCOUNTER — Other Ambulatory Visit: Payer: Self-pay | Admitting: Family Medicine

## 2017-05-19 DIAGNOSIS — I2510 Atherosclerotic heart disease of native coronary artery without angina pectoris: Secondary | ICD-10-CM

## 2017-05-31 ENCOUNTER — Telehealth: Payer: Self-pay

## 2017-05-31 MED ORDER — VITAMIN D (ERGOCALCIFEROL) 1.25 MG (50000 UNIT) PO CAPS: 50000.0000 [IU] | ORAL_CAPSULE | ORAL | 0 refills | Status: DC

## 2017-05-31 NOTE — Telephone Encounter (Signed)
Patient has been notified of lab results and a prescription for vitamin D3 50,000 units take 1 capsule once a week x12 weeks has been sent to [facility] [address] per Dr. Shah, patient verbalized understanding  

## 2017-06-24 IMAGING — DX DG ANKLE COMPLETE 3+V*L*
3 series · 3 of 3 positions shown · non-contrast
Comparison: None.

CLINICAL DATA: Lateral malleolar pain

EXAM:
LEFT ANKLE COMPLETE - 3+ VIEW

[ankle ap]
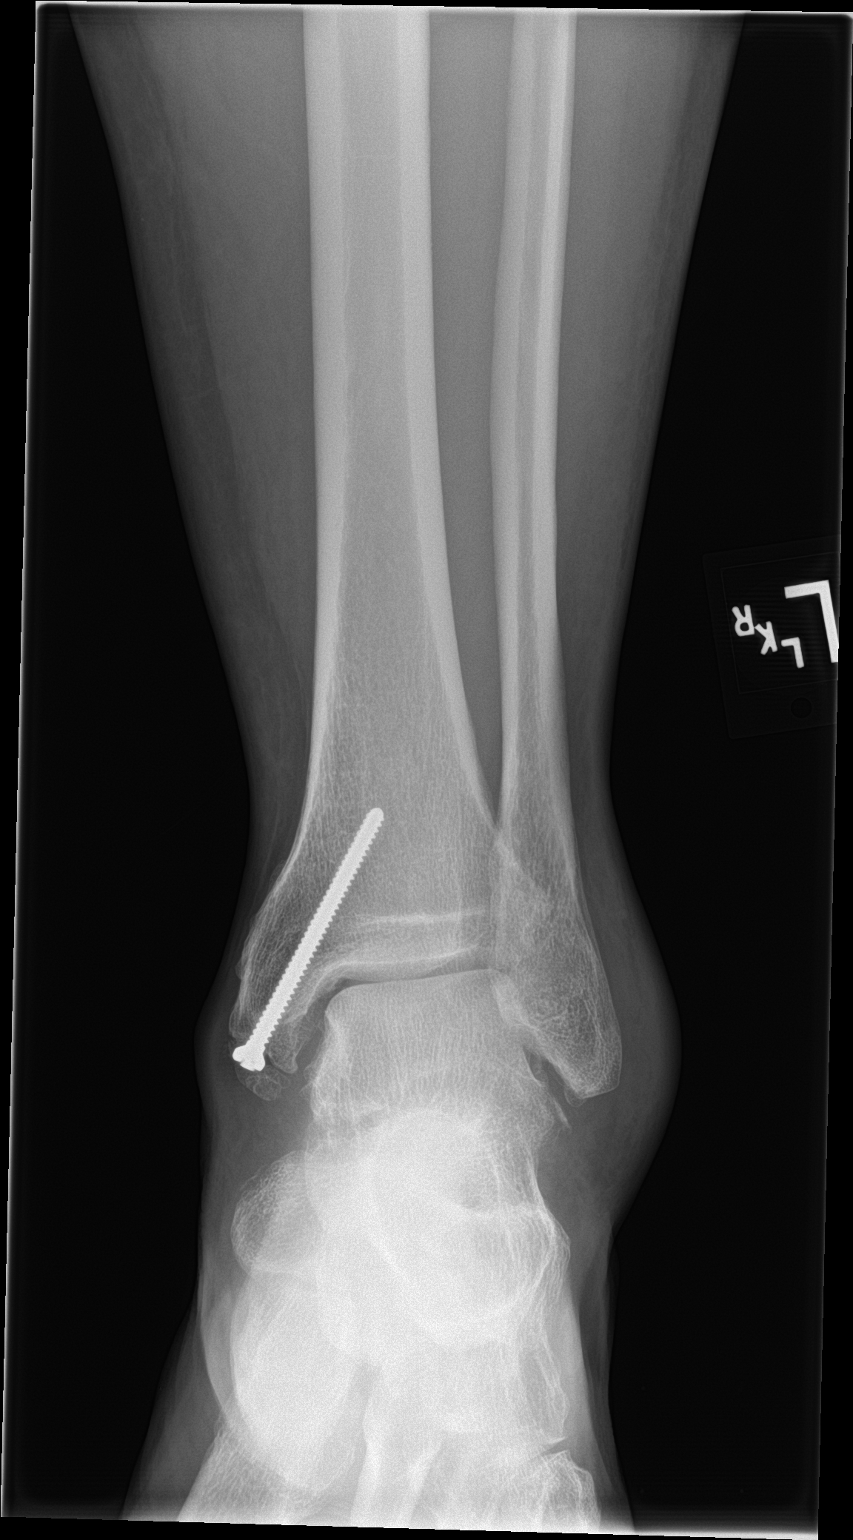

[ankle obl]
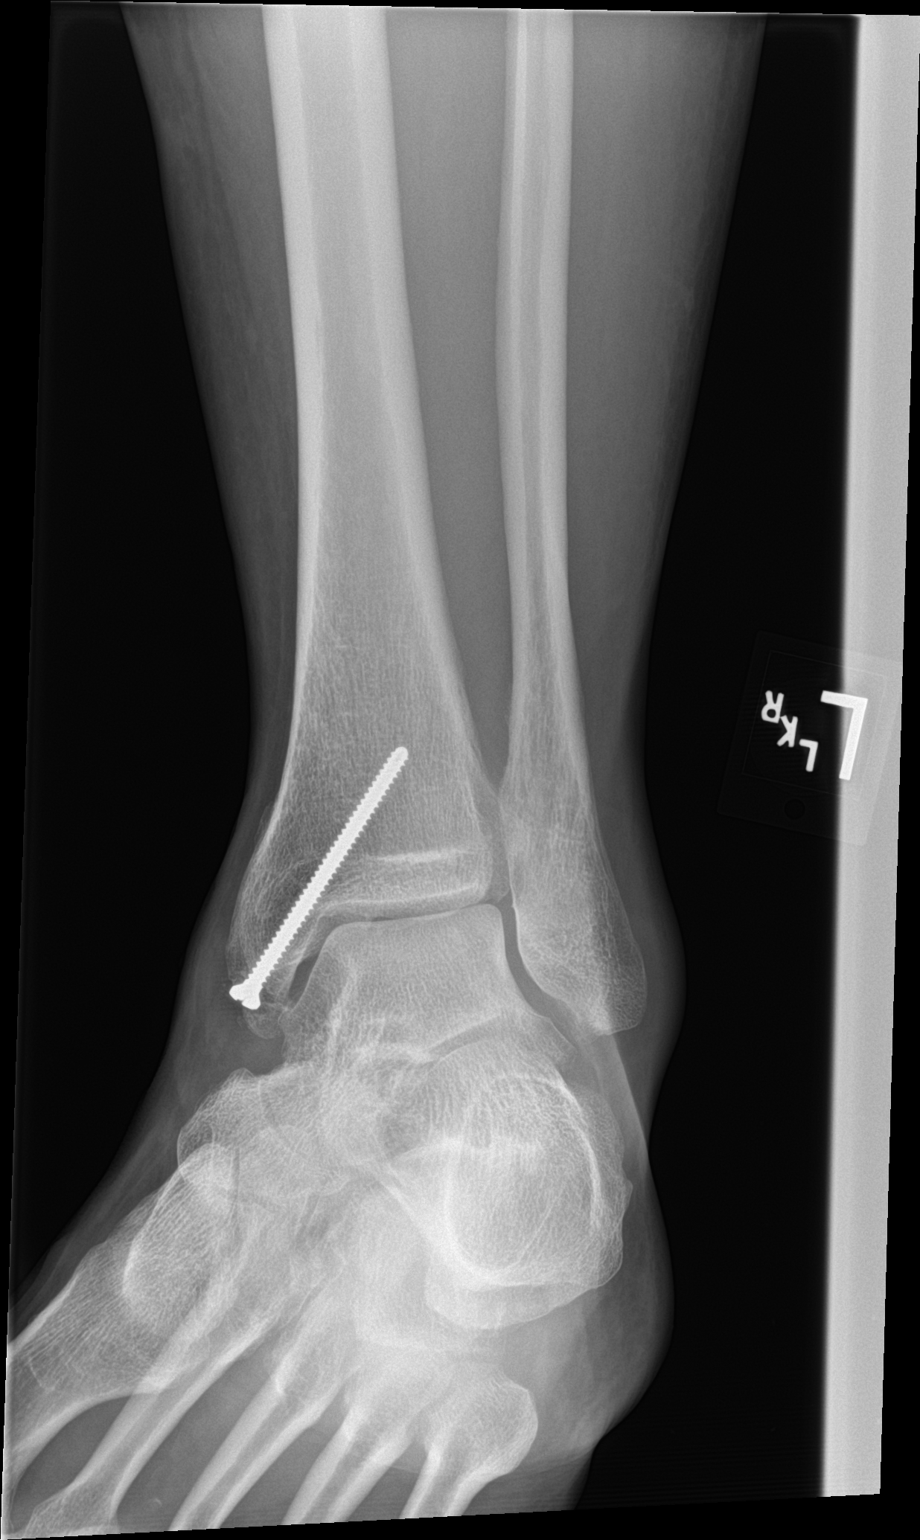

[ankle lat]
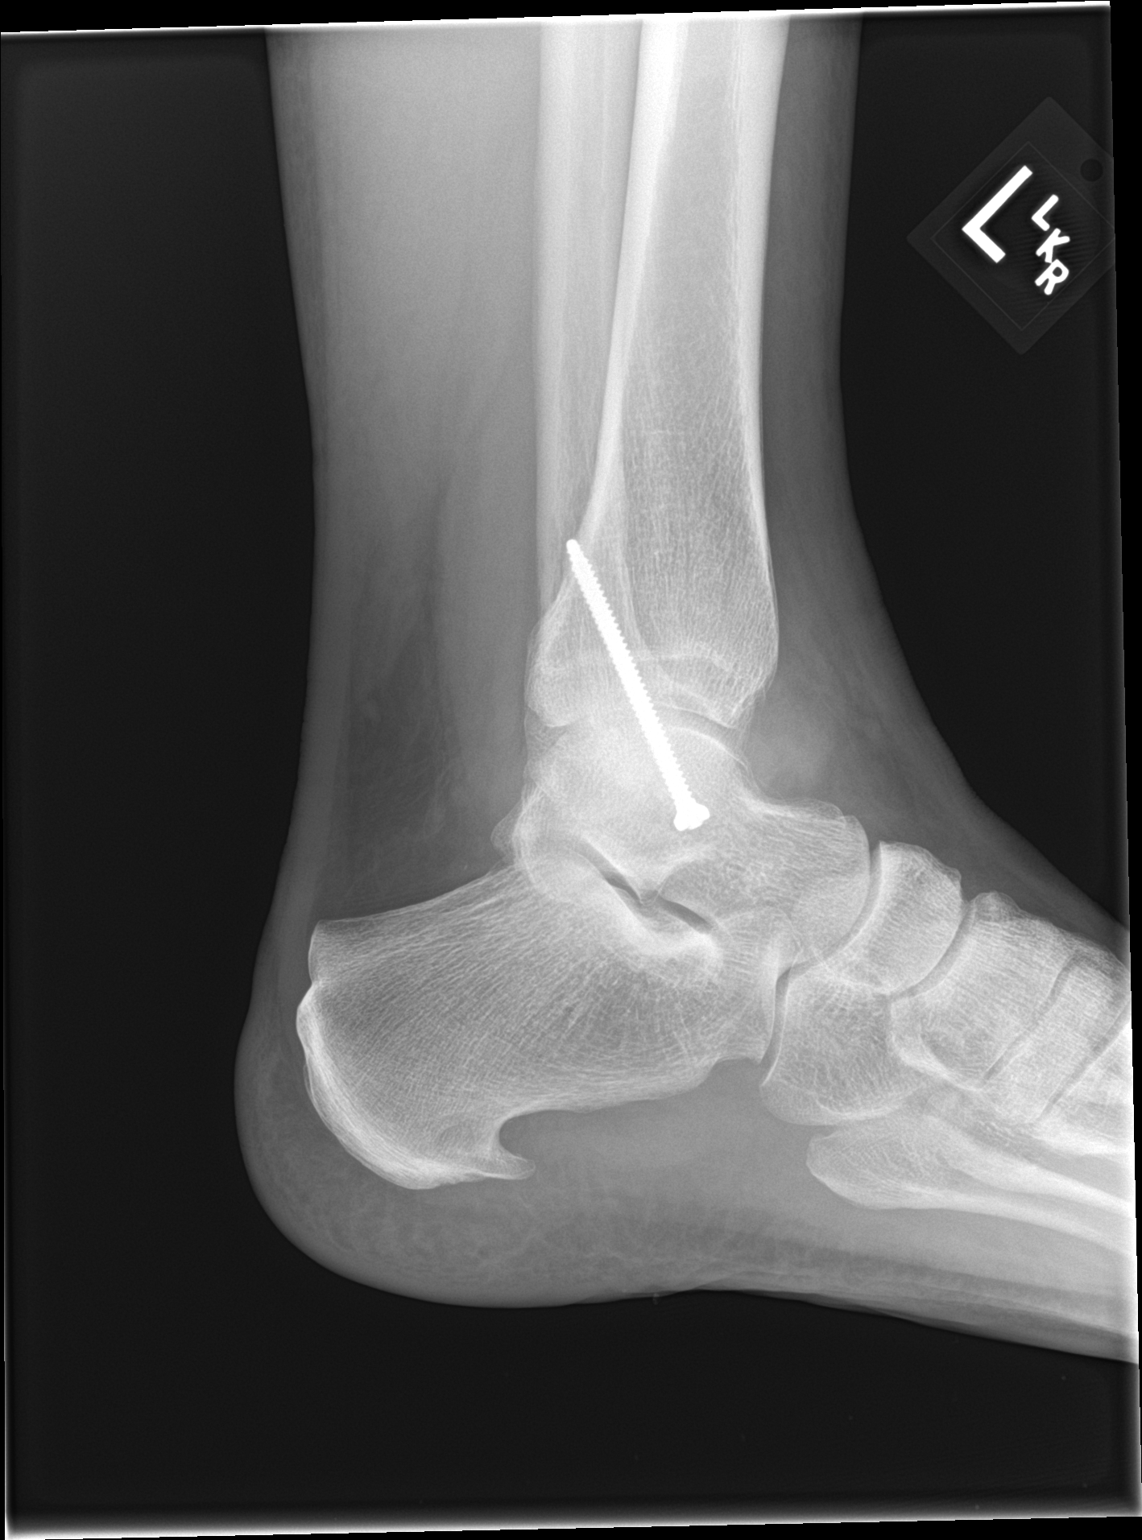

[3 of 3 positions shown; findings below may reference images not displayed]

FINDINGS: No acute fracture or dislocation. Single medial trans malleolar
screw transfixing a healed fracture. No aggressive osseous lesion.
Plantar calcaneal spur. Severe soft tissue swelling over the lateral
malleolus. Small ankle joint effusion.
IMPRESSION: 1.  No acute osseous injury of the left ankle.

## 2017-07-12 ENCOUNTER — Other Ambulatory Visit: Payer: Self-pay | Admitting: Family Medicine

## 2017-07-12 DIAGNOSIS — J4540 Moderate persistent asthma, uncomplicated: Secondary | ICD-10-CM

## 2017-07-13 ENCOUNTER — Other Ambulatory Visit: Payer: Self-pay

## 2017-07-13 DIAGNOSIS — J4540 Moderate persistent asthma, uncomplicated: Secondary | ICD-10-CM

## 2017-07-13 MED ORDER — FLUTICASONE-SALMETEROL 100-50 MCG/DOSE IN AEPB: INHALATION_SPRAY | RESPIRATORY_TRACT | 0 refills | Status: DC

## 2017-07-13 NOTE — Telephone Encounter (Signed)
Pt's wife calls and states that her husband was recently in for his PE and was given refills on all his medications however when she called the pharmacy for his inhaler she was told that there were no refills. Pt is completely out and is fearful of going through the weekend with no medication. 1 refill given as pt was last seen 05/16/17 for PE and has f/u scheduled for 08/16/17.

## 2017-08-16 ENCOUNTER — Ambulatory Visit (INDEPENDENT_AMBULATORY_CARE_PROVIDER_SITE_OTHER): Payer: BLUE CROSS/BLUE SHIELD | Admitting: Family Medicine

## 2017-08-16 ENCOUNTER — Encounter: Payer: Self-pay | Admitting: Family Medicine

## 2017-08-16 VITALS — BP 122/76 | HR 77 | Temp 97.9°F | Resp 14 | Ht 70.0 in | Wt 199.4 lb

## 2017-08-16 DIAGNOSIS — F419 Anxiety disorder, unspecified: Secondary | ICD-10-CM | POA: Diagnosis not present

## 2017-08-16 DIAGNOSIS — I2510 Atherosclerotic heart disease of native coronary artery without angina pectoris: Secondary | ICD-10-CM

## 2017-08-16 DIAGNOSIS — K219 Gastro-esophageal reflux disease without esophagitis: Secondary | ICD-10-CM

## 2017-08-16 DIAGNOSIS — Z23 Encounter for immunization: Secondary | ICD-10-CM

## 2017-08-16 DIAGNOSIS — E785 Hyperlipidemia, unspecified: Secondary | ICD-10-CM | POA: Diagnosis not present

## 2017-08-16 LAB — LIPID PANEL
CHOLESTEROL: 248 mg/dL — AB (ref <200)
HDL: 50 mg/dL (ref >40)
Non-HDL Cholesterol (Calc): 198 mg/dL (calc) — ABNORMAL HIGH (ref <130)
Total CHOL/HDL Ratio: 5 (calc) — ABNORMAL HIGH (ref <5.0)
Triglycerides: 419 mg/dL — ABNORMAL HIGH (ref <150)

## 2017-08-16 MED ORDER — ROSUVASTATIN CALCIUM 20 MG PO TABS: 20.0000 mg | ORAL_TABLET | Freq: Every day | ORAL | 1 refills | Status: DC

## 2017-08-16 MED ORDER — ESCITALOPRAM OXALATE 10 MG PO TABS: 10.0000 mg | ORAL_TABLET | Freq: Every day | ORAL | 1 refills | Status: DC

## 2017-08-16 MED ORDER — CLOPIDOGREL BISULFATE 75 MG PO TABS: 75.0000 mg | ORAL_TABLET | Freq: Every day | ORAL | 1 refills | Status: DC

## 2017-08-16 MED ORDER — RABEPRAZOLE SODIUM 20 MG PO TBEC: 20.0000 mg | DELAYED_RELEASE_TABLET | Freq: Every day | ORAL | 1 refills | Status: DC

## 2017-08-16 NOTE — Progress Notes (Signed)
Name: Paul Page   MRN: [id number]    DOB: 12-10-1955   Date:08/16/2017       Progress Note  Subjective  Chief Complaint  Chief Complaint  Patient presents with  . Follow-up    3 months  . Medication Refill    Anxiety  Presents for follow-up visit. Patient reports no chest pain, depressed mood, excessive worry, insomnia, nausea, nervous/anxious behavior, panic or shortness of breath. The severity of symptoms is moderate and causing significant distress.    Gastroesophageal Reflux  He reports no abdominal pain, no chest pain, no heartburn or no nausea. This is a chronic problem. The problem has been unchanged.  Hyperlipidemia  This is a chronic problem. The problem is uncontrolled. Recent lipid tests were reviewed and are high. Pertinent negatives include no chest pain, leg pain, myalgias or shortness of breath. Current antihyperlipidemic treatment includes statins.  Elevated triglycerides, currently on simvastatin, has history of coronary artery disease, reports no side effects from simvastatin   Past Medical History:  Diagnosis Date  . Anxiety   . Asthma   . Coronary artery disease    Three-vessel coronary artery disease diagnosed in October 2008 with normal ejection fraction. PCI and drug-eluting stent placement to the LAD(3.0 x 8 mm Promus), left circumflex(2.5 x 12 mm Promus) and right coronary artery(3.5 x 12 mm Promus). Unstable angina in 2011. PCI of RCA with a 3.5 x 15 mm Promus and PCI of LAD with a 2.5 x 28 mm Promus drug-eluting stents   . GERD (gastroesophageal reflux disease)   . Heart disease   . Hyperlipidemia   . Hypertension     Past Surgical History:  Procedure Laterality Date  . ANKLE FRACTURE SURGERY    . CARDIAC CATHETERIZATION  2011  . CARDIAC CATHETERIZATION  2008  . CORONARY STENT PLACEMENT  2008 & 2011    Family History  Problem Relation Age of Onset  . Parkinson's disease Mother   . Cancer Father     Social History   Socioeconomic  History  . Marital status: Married    Spouse name: Not on file  . Number of children: Not on file  . Years of education: Not on file  . Highest education level: Not on file  Social Needs  . Financial resource strain: Not on file  . Food insecurity - worry: Not on file  . Food insecurity - inability: Not on file  . Transportation needs - medical: Not on file  . Transportation needs - non-medical: Not on file  Occupational History  . Not on file  Tobacco Use  . Smoking status: Never Smoker  . Smokeless tobacco: Never Used  Substance and Sexual Activity  . Alcohol use: Yes    Alcohol/week: 0.0 oz    Comment: occasional wine  . Drug use: No  . Sexual activity: Yes  Other Topics Concern  . Not on file  Social History Narrative  . Not on file     Current Outpatient Medications:  .  albuterol (PROAIR HFA) 108 (90 Base) MCG/ACT inhaler, INHALE 2 PUFFS INTO THE LUNGS EVERY 6 (SIX) HOURS AS NEEDED FOR WHEEZING OR SHORTNESS OF BREATH., Disp: 4 Inhaler, Rfl: 2 .  clopidogrel (PLAVIX) 75 MG tablet, Take 1 tablet (75 mg total) by mouth daily., Disp: 90 tablet, Rfl: 1 .  escitalopram (LEXAPRO) 10 MG tablet, TAKE 1 TABLET (10 MG TOTAL) BY MOUTH DAILY., Disp: 90 tablet, Rfl: 1 .  Fluticasone-Salmeterol (ADVAIR DISKUS) 100-50 MCG/DOSE AEPB, TAKE 1 PUFF  BY MOUTH EVERY DAY, Disp: 60 each, Rfl: 0 .  losartan (COZAAR) 50 MG tablet, TAKE 1 TABLET (50 MG TOTAL) BY MOUTH DAILY., Disp: 30 tablet, Rfl: 3 .  Multiple Vitamins-Minerals (CENTRUM SILVER PO), Take 1 tablet by mouth daily., Disp: , Rfl:  .  nebivolol (BYSTOLIC) 5 MG tablet, Take 1 tablet (5 mg total) by mouth daily., Disp: 90 tablet, Rfl: 1 .  RABEprazole (ACIPHEX) 20 MG tablet, Take 1 tablet (20 mg total) by mouth daily., Disp: 90 tablet, Rfl: 1 .  rosuvastatin (CRESTOR) 20 MG tablet, Take 1 tablet (20 mg total) by mouth daily., Disp: 90 tablet, Rfl: 1 .  ADVAIR DISKUS 100-50 MCG/DOSE AEPB, TAKE 1 PUFF BY MOUTH EVERY DAY, Disp: 60 each, Rfl:  0 .  Vitamin D, Ergocalciferol, (DRISDOL) 50000 units CAPS capsule, Take 1 capsule (50,000 Units total) by mouth once a week. For 12 weeks (Patient not taking: Reported on 08/16/2017), Disp: 12 capsule, Rfl: 0  Allergies  Allergen Reactions  . Fish Oil Hives  . Peanut Oil   . Tetracyclines & Related Other (See Comments)    Intolerance - stomach upset     Review of Systems  Respiratory: Negative for shortness of breath.   Cardiovascular: Negative for chest pain.  Gastrointestinal: Negative for abdominal pain, heartburn and nausea.  Musculoskeletal: Negative for myalgias.  Psychiatric/Behavioral: The patient is not nervous/anxious and does not have insomnia.      Objective  Vitals:   08/16/17 0927  BP: 122/76  Pulse: 77  Resp: 14  Temp: 97.9 F (36.6 C)  TempSrc: Oral  SpO2: 93%  Weight: 199 lb 6.4 oz (90.4 kg)  Height: 5\' 10"  (1.778 m)    Physical Exam  Constitutional: He is oriented to person, place, and time and well-developed, well-nourished, and in no distress.  HENT:  Head: Normocephalic and atraumatic.  Cardiovascular: Normal rate, regular rhythm and normal heart sounds.  No murmur heard. Pulmonary/Chest: Effort normal and breath sounds normal. He has no wheezes.  Abdominal: Soft. Bowel sounds are normal. There is no tenderness.  Musculoskeletal: He exhibits no edema.  Neurological: He is alert and oriented to person, place, and time.  Psychiatric: Mood, memory, affect and judgment normal.  Nursing note and vitals reviewed.     Assessment & Plan  1. Needs flu shot  - Flu Vaccine QUAD 6+ mos PF IM (Fluarix Quad PF)  2. Anxiety Stable, responsive to Lexapro, refills provided - escitalopram (LEXAPRO) 10 MG tablet; Take 1 tablet (10 mg total) daily by mouth.  Dispense: 90 tablet; Refill: 1  3. Coronary artery disease involving native coronary artery of native heart without angina pectoris On Plavix and statin, follows with cardiology - clopidogrel  (PLAVIX) 75 MG tablet; Take 1 tablet (75 mg total) daily by mouth.  Dispense: 90 tablet; Refill: 1  4. Dyslipidemia Obtain FLP, if still elevated triglycerides, consider adding Vascepa - rosuvastatin (CRESTOR) 20 MG tablet; Take 1 tablet (20 mg total) daily by mouth.  Dispense: 90 tablet; Refill: 1 - Lipid panel  5. Gastroesophageal reflux disease, esophagitis presence not specified Symptoms of acid reflux are in remission on PPI - RABEprazole (ACIPHEX) 20 MG tablet; Take 1 tablet (20 mg total) daily by mouth.  Dispense: 90 tablet; Refill: 1   Norvil Martensen Asad A. Faylene KurtzShah [facility] 08/16/2017 9:37 AM

## 2017-08-22 ENCOUNTER — Encounter: Payer: Self-pay | Admitting: Family Medicine

## 2017-08-22 ENCOUNTER — Encounter: Payer: Self-pay | Admitting: Cardiovascular Disease

## 2017-08-27 ENCOUNTER — Other Ambulatory Visit: Payer: Self-pay | Admitting: Family Medicine

## 2017-09-16 ENCOUNTER — Other Ambulatory Visit: Payer: Self-pay | Admitting: Family Medicine

## 2017-09-27 ENCOUNTER — Telehealth: Payer: Self-pay | Admitting: Family Medicine

## 2017-09-27 NOTE — Telephone Encounter (Signed)
Copied from CRM [phone]. Topic: Quick Communication - Rx Refill/Question >> Sep 27, 2017  9:37 AM Clack, Princella PellegriniJessica D wrote: Has the patient contacted their pharmacy? Yes.     (Agent: If no, request that the patient contact the pharmacy for the refill.)   Preferred Pharmacy (with phone number or street name[id number] - [address] [phone] (Phone) [phone] (Fax)  Pt wife Darel HongJudy states when pt was in Nov. Dr. Sherryll BurgerShah was to [facility] rosuvastatin (CRESTOR) 40 Mg. Pt was taking rosuvastatin (CRESTOR) 20mg , she also states he only has 2 pills left.   Agent: Please be advised that RX refills may take up to 3 business days. We ask that you follow-up with your pharmacy.

## 2017-09-27 NOTE — Telephone Encounter (Signed)
New dosage has been verbalize to the pharmacy

## 2017-10-12 ENCOUNTER — Other Ambulatory Visit: Payer: Self-pay | Admitting: Cardiovascular Disease

## 2017-10-14 ENCOUNTER — Other Ambulatory Visit: Payer: Self-pay | Admitting: Cardiovascular Disease

## 2017-10-19 ENCOUNTER — Ambulatory Visit: Payer: BLUE CROSS/BLUE SHIELD | Admitting: Cardiovascular Disease

## 2017-10-19 ENCOUNTER — Encounter: Payer: Self-pay | Admitting: Cardiovascular Disease

## 2017-10-19 VITALS — BP 138/78 | HR 76 | Ht 70.0 in | Wt 198.8 lb

## 2017-10-19 DIAGNOSIS — I10 Essential (primary) hypertension: Secondary | ICD-10-CM | POA: Diagnosis not present

## 2017-10-19 DIAGNOSIS — E782 Mixed hyperlipidemia: Secondary | ICD-10-CM | POA: Diagnosis not present

## 2017-10-19 DIAGNOSIS — I2510 Atherosclerotic heart disease of native coronary artery without angina pectoris: Secondary | ICD-10-CM

## 2017-10-19 MED ORDER — ICOSAPENT ETHYL 1 G PO CAPS: 2.0000 | ORAL_CAPSULE | Freq: Two times a day (BID) | ORAL | 3 refills | Status: DC

## 2017-10-19 MED ORDER — NEBIVOLOL HCL 5 MG PO TABS: 5.0000 mg | ORAL_TABLET | Freq: Every day | ORAL | 3 refills | Status: DC

## 2017-10-19 NOTE — Progress Notes (Signed)
Cardiology Office Note   Date:  10/19/2017   ID:  Paul Page, DOB 05-30-1956, MRN [id number]  PCP:  Ellyn HackShah, Syed Asad A, MD  Cardiologist:   Lorine BearsMuhammad Duwan Adrian, MD   Chief Complaint  Patient presents with  . other    6 month f/u discuss Repatha/ cholestorol options. Meds reviewed verbally with pt.      History of Present Illness: Paul Page is a 62 y.o. male who is here today for a follow-up visit regarding coronary artery disease which has [facility] [place] [facility] [place].   He presented in 2008 with unstable angina. Cardiac catheterization showed severe three-vessel coronary artery disease with normal ejection fraction. He underwent successful angioplasty and three-vessel drug-eluting stent placement. He returned in 2011 with unstable angina. Repeat cardiac catheterization showed new disease in the LAD and RCA. Both of them were treated with drug-eluting stents. No cardiac events since then. He has chronic medical conditions that include hypertension, hyperlipidemia and palpitations controlled with a small dose beta blocker. He has strong family history of coronary artery disease and hyperlipidemia. He is not a smoker. He had a treadmill stress test in July, 2017 which was negative for ischemia but he had hypertensive response to exercise. He was able to exercise for 7-1/2 minutes. The dose of Bystolic was increased to 10 mg daily. However, he had worsening fatigue and the dose was decreased back to 5 mg once daily. Losartan 50 mg once daily was added.   He has been doing very well overall with no chest pain, shortness of breath or palpitations.  He joined the gym recently and started exercising 3 times a week.  He takes his medications regularly.  Lipid profile continues to be not at target with elevated triglyceride.  The dose of rosuvastatin was increased recently by his primary care physician.  Past Medical History:  Diagnosis Date  . Anxiety   . Asthma   . Coronary  artery disease    Three-vessel coronary artery disease diagnosed in October 2008 with normal ejection fraction. PCI and drug-eluting stent placement to the LAD(3.0 x 8 mm Promus), left circumflex(2.5 x 12 mm Promus) and right coronary artery(3.5 x 12 mm Promus). Unstable angina in 2011. PCI of RCA with a 3.5 x 15 mm Promus and PCI of LAD with a 2.5 x 28 mm Promus drug-eluting stents   . GERD (gastroesophageal reflux disease)   . Heart disease   . Hyperlipidemia   . Hypertension     Past Surgical History:  Procedure Laterality Date  . ANKLE FRACTURE SURGERY    . CARDIAC CATHETERIZATION  2011  . CARDIAC CATHETERIZATION  2008  . CORONARY STENT PLACEMENT  2008 & 2011     Current Outpatient Medications  Medication Sig Dispense Refill  . ADVAIR DISKUS 100-50 MCG/DOSE AEPB TAKE 1 PUFF BY MOUTH EVERY DAY 60 each 0  . albuterol (PROAIR HFA) 108 (90 Base) MCG/ACT inhaler INHALE 2 PUFFS INTO THE LUNGS EVERY 6 (SIX) HOURS AS NEEDED FOR WHEEZING OR SHORTNESS OF BREATH. 4 Inhaler 2  . BYSTOLIC 5 MG tablet TAKE 1 TABLET BY MOUTH EVERY DAY 30 tablet 0  . clopidogrel (PLAVIX) 75 MG tablet Take 1 tablet (75 mg total) daily by mouth. 90 tablet 1  . escitalopram (LEXAPRO) 10 MG tablet Take 1 tablet (10 mg total) daily by mouth. 90 tablet 1  . losartan (COZAAR) 50 MG tablet TAKE 1 TABLET (50 MG TOTAL) BY MOUTH DAILY. 30 tablet 3  .  Multiple Vitamins-Minerals (CENTRUM SILVER PO) Take 1 tablet by mouth daily.    . RABEprazole (ACIPHEX) 20 MG tablet Take 1 tablet (20 mg total) daily by mouth. 90 tablet 1  . rosuvastatin (CRESTOR) 40 MG tablet Take 40 mg by mouth daily.     No current facility-administered medications for this visit.     Allergies:   Fish oil; Peanut oil; and Tetracyclines & related    Social History:  The patient  reports that  has never smoked. he has never used smokeless tobacco. He reports that he drinks alcohol. He reports that he does not use drugs.   Family History:  The patient's  family history includes Cancer in his father; Parkinson's disease in his mother.  Family history of coronary artery disease and hyperlipidemia.   ROS:  Please see the history of present illness.   Otherwise, review of systems are positive for none.   All other systems are reviewed and negative.    PHYSICAL EXAM: VS:  BP 138/78 (BP Location: Left Arm, Patient Position: Sitting, Cuff Size: Normal)   Pulse 76   Ht 5\' 10"  (1.778 m)   Wt 198 lb 12 oz (90.2 kg)   BMI 28.52 kg/m  , BMI Body mass index is 28.52 kg/m. GEN: Well nourished, well developed, in no acute distress  HEENT: normal  Neck: no JVD, carotid bruits, or masses Cardiac: RRR; no murmurs, rubs, or gallops,no edema  Respiratory:  clear to auscultation bilaterally, normal work of breathing GI: soft, nontender, nondistended, + BS MS: no deformity or atrophy  Skin: warm and dry, no rash Neuro:  Strength and sensation are intact Psych: euthymic mood, full affect   EKG:  EKG is ordered today. EKG showed normal sinus rhythm with 1 PVC.  No significant ST or T wave changes.   Recent Labs: 05/16/2017: Hemoglobin 15.2; Platelets 288; TSH 1.72    Lipid Panel    Component Value Date/Time   CHOL 248 (H) 08/16/2017 1013   CHOL 206 (H) 09/05/2016 0848   TRIG 419 (H) 08/16/2017 1013   HDL 50 08/16/2017 1013   HDL 46 09/05/2016 0848   CHOLHDL 5.0 (H) 08/16/2017 1013   LDLCALC 81 09/05/2016 0848      Wt Readings from Last 3 Encounters:  10/19/17 198 lb 12 oz (90.2 kg)  08/16/17 199 lb 6.4 oz (90.4 kg)  05/16/17 197 lb 6.4 oz (89.5 kg)        ASSESSMENT AND PLAN:  1. Coronary artery disease involving native coronary arteries: He is doing well with no anginal symptoms.  Continue lifelong dual antiplatelet therapy.  2. Hyperlipidemia: I reviewed recent lipid profile with him which showed triglyceride of 419.  LDL previously was 81.  I agree with increasing rosuvastatin to 40 mg once daily.  I elected to add Vascepa 2 g  twice daily and discussed with him the importance of improving his diet and continued exercise.  Repeat fasting lipid and liver profile in 2 months.  If LDL remains above 70, we can consider adding Zetia or a PC SK 9 inhibitor.    3. Essential hypertension: Blood pressure is reasonably controlled on current medications.   3. Palpitations: Controlled with small dose Bystolic.    Disposition:   FU with me in 6 months  Signed,  Lorine Bears, MD  10/19/2017 9:47 AM    [facility]

## 2017-10-19 NOTE — Patient Instructions (Addendum)
Medication Instructions:  Your physician has recommended you make the following change in your medication:  START taking vascepa 2 grams (2 tablets)  twice daily with meals.   Labwork: Fasting lipid and liver profile in 2 months at the [facility] lab. Nothing to eat or drink after midnight before your labs.   Testing/Procedures: none  Follow-Up: Your physician wants you to follow-up in: 6 months with Dr. Kirke Corin.  You will receive a reminder letter in the mail two months in advance. If you don't receive a letter, please call our office to schedule the follow-up appointment.   Any Other Special Instructions Will Be Listed Below (If Applicable).     If you need a refill on your cardiac medications before your next appointment, please call your pharmacy.  Icosapent ethyl capsules What is this medicine? ICOSAPENT ETHYL (eye KOE sa pent eth il) contains essential fats. It is used to treat high triglyceride levels. This medicine may be used for other purposes; ask your health care provider or pharmacist if you have questions. COMMON BRAND NAME(S): VASCEPA What should I tell my health care provider before I take this medicine? They need to know if you have any of these conditions: -bleeding disorders -liver disease -an unusual or allergic reaction to icosapent ethyl, fish, shellfish, other medicines, foods, dyes, or preservatives -pregnant or trying to get pregnant -breast-feeding How should I use this medicine? Take this medicine by mouth with a glass of water. Follow the directions on the prescription label. Take this medicine with food. Do not cut, crush or chew this medicine. Take your medicine at regular intervals. Do not take it more often than directed. Do not stop taking except on your doctor's advice. Talk to your pediatrician regarding the use of this medicine in children. Special care may be needed. Overdosage: If you think you have taken too much of this medicine contact a  poison control center or emergency room at once. NOTE: This medicine is only for you. Do not share this medicine with others. What if I miss a dose? If you miss a dose, take it as soon as you can. If it is almost time for your next dose, take only that dose. Do not take double or extra doses. What may interact with this medicine? This medicine may interact with the following medications: -aspirin and aspirin-like medicines -beta-blockers like metoprolol and propranolol -certain medicines that treat or prevent blood clots like warfarin, enoxaparin, dalteparin, apixaban, dabigatran, and rivaroxaban -diuretics -male hormones, like estrogens and birth control pills This list may not describe all possible interactions. Give your health care provider a list of all the medicines, herbs, non-prescription drugs, or dietary supplements you use. Also tell them if you smoke, drink alcohol, or use illegal drugs. Some items may interact with your medicine. What should I watch for while using this medicine? You may need blood work done while you are taking this medicine. Follow a good diet and exercise plan. Taking this medicine does not replace a healthy lifestyle. Some foods that have omega-3 fatty acids naturally are fatty fish like albacore tuna, halibut, herring, mackerel, lake trout, salmon, and sardines. If you are scheduled for any medical or dental procedure, tell your healthcare provider that you are taking this medicine. You may need to stop taking this medicine before the procedure. What side effects may I notice from receiving this medicine? Side effects that you should report to your doctor or health care professional as soon as possible: -allergic reactions like  skin rash, itching or hives, swelling of the face, lips, or tongue -breathing problems -unusual bleeding or bruising Side effects that usually do not require medical attention (report to your doctor or health care professional if they  continue or are bothersome): -joint pain -sore throat This list may not describe all possible side effects. Call your doctor for medical advice about side effects. You may report side effects to FDA at 1-800-FDA-1088. Where should I keep my medicine? Keep out of the reach of children. Store at room temperature between 15 and 30 degrees C (59 and 86 degrees F). Throw away any unused medicine after the expiration date. NOTE: This sheet is a summary. It may not cover all possible information. If you have questions about this medicine, talk to your doctor, pharmacist, or health care provider.  2018 Elsevier/Gold Standard (2015-10-21 13:40:36)

## 2017-10-20 ENCOUNTER — Encounter: Payer: Self-pay | Admitting: Cardiovascular Disease

## 2017-10-22 ENCOUNTER — Other Ambulatory Visit: Payer: Self-pay

## 2017-11-02 ENCOUNTER — Telehealth: Payer: Self-pay | Admitting: Cardiovascular Disease

## 2017-11-02 ENCOUNTER — Encounter: Payer: Self-pay | Admitting: Cardiovascular Disease

## 2017-11-02 NOTE — Telephone Encounter (Signed)
Completed Non-formulary drug request form from [employer] as Tillman SersVascepa is not currently on formulary list and not covered by insurance.  Medication Samples have been provided to the patient.  Drug name: Vascepa       Strength: 1g        Qty: 3 boxes LOT: [id number]  Exp.Date: 09/2020  Dosing instructions: Take two tablets twice daily   The patient has been instructed regarding the correct time, dose, and frequency of taking this medication, including desired effects and most common side effects.   Shon BatonSharon H Von Quintanar 1:09 PM 11/02/2017  At front desk for pick up

## 2017-11-08 ENCOUNTER — Telehealth: Payer: Self-pay | Admitting: Cardiovascular Disease

## 2017-11-08 NOTE — Telephone Encounter (Signed)
Faxed [employer] Nonformulary Drug Request Form for Vascepa to [id number]

## 2017-11-08 NOTE — Telephone Encounter (Signed)
Awaiting MD signature on [employer] request for Vascepa.  Medication Samples have been provided to the patient.  Drug name: Vascepa       Strength: 1mg         Qty: 5 boxes  LOT: [id number]  Exp.Date: 12/21  Dosing instructions: Take 2 tablets twice daily  The patient has been instructed regarding the correct time, dose, and frequency of taking this medication, including desired effects and most common side effects.   Shon BatonSharon H Kambrey Hagger 12:42 PM 11/08/2017   At front desk for pickup

## 2017-11-14 NOTE — Telephone Encounter (Signed)
Received denial [facility] from [facility], Reference: [id number] Patient must have tried lovaza for Vascepa consideration. Patient tried OTC fish oil 3 grams qd but developed a rash. Will review with Dr. Kirke CorinArida.

## 2017-11-14 NOTE — Telephone Encounter (Signed)
I s/w Tom at [employer]. Unsure why patient provided [employer] Nonformulary Drug Request Form. Elijah Birkom will fax denial letter, including paperwork to appeal to my attention.

## 2017-11-15 NOTE — Telephone Encounter (Signed)
Denial fax given to Dr. Kirke CorinArida for review.

## 2017-11-16 ENCOUNTER — Encounter: Payer: Self-pay | Admitting: Family Medicine

## 2017-11-16 ENCOUNTER — Ambulatory Visit: Payer: BLUE CROSS/BLUE SHIELD | Admitting: Family Medicine

## 2017-11-16 VITALS — BP 116/68 | HR 73 | Temp 97.7°F | Resp 14 | Wt 196.0 lb

## 2017-11-16 DIAGNOSIS — E785 Hyperlipidemia, unspecified: Secondary | ICD-10-CM | POA: Diagnosis not present

## 2017-11-16 DIAGNOSIS — I10 Essential (primary) hypertension: Secondary | ICD-10-CM

## 2017-11-16 DIAGNOSIS — I2510 Atherosclerotic heart disease of native coronary artery without angina pectoris: Secondary | ICD-10-CM

## 2017-11-16 DIAGNOSIS — F419 Anxiety disorder, unspecified: Secondary | ICD-10-CM | POA: Diagnosis not present

## 2017-11-16 DIAGNOSIS — J4540 Moderate persistent asthma, uncomplicated: Secondary | ICD-10-CM

## 2017-11-16 NOTE — Progress Notes (Signed)
Name: Paul Page   MRN: [id number]    DOB: Jan 15, 1956   Date:11/16/2017       Progress Note  Subjective  Chief Complaint  Chief Complaint  Patient presents with  . Follow-up    3 months  . Medication Refill    on asthma meds  . Asthma  . Hypertension  . Hyperlipidemia  . Anxiety    Asthma  There is no chest tightness (occasional chest tightness), cough, frequent throat clearing, shortness of breath, sputum production or wheezing. This is a chronic problem. Pertinent negatives include no chest pain, dyspnea on exertion or headaches. His symptoms are alleviated by beta-agonist and steroid inhaler. His past medical history is significant for asthma.  Hypertension  This is a chronic problem. The problem is unchanged. The problem is controlled. Associated symptoms include anxiety. Pertinent negatives include no blurred vision, chest pain, headaches, orthopnea, palpitations or shortness of breath. Past treatments include beta blockers and angiotensin blockers. Hypertensive end-organ damage includes CAD/MI. There is no history of kidney disease or CVA.  Hyperlipidemia  This is a recurrent problem. The problem is uncontrolled. Recent lipid tests were reviewed and are high. Pertinent negatives include no chest pain or shortness of breath. Current antihyperlipidemic treatment includes statins (Vascepa prescribed by Cardiology is not covered.  Taking Samples 3g daily. ).  Anxiety  Presents for follow-up visit. Patient reports no chest pain, excessive worry, nervous/anxious behavior, palpitations or shortness of breath. The severity of symptoms is moderate.   His past medical history is significant for asthma.     Past Medical History:  Diagnosis Date  . Anxiety   . Asthma   . Coronary artery disease    Three-vessel coronary artery disease diagnosed in October 2008 with normal ejection fraction. PCI and drug-eluting stent placement to the LAD(3.0 x 8 mm Promus), left circumflex(2.5 x 12  mm Promus) and right coronary artery(3.5 x 12 mm Promus). Unstable angina in 2011. PCI of RCA with a 3.5 x 15 mm Promus and PCI of LAD with a 2.5 x 28 mm Promus drug-eluting stents   . GERD (gastroesophageal reflux disease)   . Heart disease   . Hyperlipidemia   . Hypertension     Past Surgical History:  Procedure Laterality Date  . ANKLE FRACTURE SURGERY    . CARDIAC CATHETERIZATION  2011  . CARDIAC CATHETERIZATION  2008  . CORONARY STENT PLACEMENT  2008 & 2011    Family History  Problem Relation Age of Onset  . Parkinson's disease Mother   . Cancer Father     Social History   Socioeconomic History  . Marital status: Married    Spouse name: Not on file  . Number of children: Not on file  . Years of education: Not on file  . Highest education level: Not on file  Social Needs  . Financial resource strain: Not on file  . Food insecurity - worry: Not on file  . Food insecurity - inability: Not on file  . Transportation needs - medical: Not on file  . Transportation needs - non-medical: Not on file  Occupational History  . Not on file  Tobacco Use  . Smoking status: Never Smoker  . Smokeless tobacco: Never Used  Substance and Sexual Activity  . Alcohol use: Yes    Alcohol/week: 0.0 oz    Comment: occasional wine  . Drug use: No  . Sexual activity: Yes  Other Topics Concern  . Not on file  Social History Narrative  .  Not on file     Current Outpatient Medications:  .  ADVAIR DISKUS 100-50 MCG/DOSE AEPB, TAKE 1 PUFF BY MOUTH EVERY DAY, Disp: 60 each, Rfl: 0 .  albuterol (PROAIR HFA) 108 (90 Base) MCG/ACT inhaler, INHALE 2 PUFFS INTO THE LUNGS EVERY 6 (SIX) HOURS AS NEEDED FOR WHEEZING OR SHORTNESS OF BREATH., Disp: 4 Inhaler, Rfl: 2 .  clopidogrel (PLAVIX) 75 MG tablet, Take 1 tablet (75 mg total) daily by mouth., Disp: 90 tablet, Rfl: 1 .  escitalopram (LEXAPRO) 10 MG tablet, Take 1 tablet (10 mg total) daily by mouth., Disp: 90 tablet, Rfl: 1 .  Icosapent Ethyl  (VASCEPA) 1 g CAPS, Take 2 tablets by mouth 2 (two) times daily with a meal., Disp: 360 capsule, Rfl: 3 .  Multiple Vitamins-Minerals (CENTRUM SILVER PO), Take 1 tablet by mouth daily., Disp: , Rfl:  .  nebivolol (BYSTOLIC) 5 MG tablet, Take 1 tablet (5 mg total) by mouth daily., Disp: 90 tablet, Rfl: 3 .  RABEprazole (ACIPHEX) 20 MG tablet, Take 1 tablet (20 mg total) daily by mouth., Disp: 90 tablet, Rfl: 1 .  rosuvastatin (CRESTOR) 40 MG tablet, Take 40 mg by mouth daily., Disp: , Rfl:  .  losartan (COZAAR) 50 MG tablet, TAKE 1 TABLET (50 MG TOTAL) BY MOUTH DAILY., Disp: 30 tablet, Rfl: 3  Allergies  Allergen Reactions  . Fish Oil Hives  . Peanut Oil   . Tetracyclines & Related Other (See Comments)    Intolerance - stomach upset     Review of Systems  Eyes: Negative for blurred vision.  Respiratory: Negative for cough, sputum production, shortness of breath and wheezing.   Cardiovascular: Negative for chest pain, dyspnea on exertion, palpitations and orthopnea.  Neurological: Negative for headaches.  Psychiatric/Behavioral: The patient is not nervous/anxious.     Objective  Vitals:   11/16/17 0934  BP: 116/68  Pulse: 73  Resp: 14  Temp: 97.7 F (36.5 C)  TempSrc: Oral  SpO2: 93%  Weight: 196 lb (88.9 kg)    Physical Exam  Constitutional: He is oriented to person, place, and time and well-developed, well-nourished, and in no distress.  HENT:  Head: Normocephalic and atraumatic.  Cardiovascular: Normal rate, regular rhythm and normal heart sounds.  No murmur heard. Pulmonary/Chest: Effort normal and breath sounds normal. He has no wheezes.  Abdominal: Soft. Bowel sounds are normal. There is no tenderness.  Musculoskeletal: He exhibits no edema.  Neurological: He is alert and oriented to person, place, and time.  Psychiatric: Mood, memory, affect and judgment normal.  Nursing note and vitals reviewed.       Assessment & Plan  1. Essential hypertension BP  stable on present antihypertensive treatment  2. Coronary artery disease involving native coronary artery of native heart without angina pectoris And followed by cardiology, on Plavix  3. Moderate persistent asthma without complication Table, needs to take Advair every day, albuterol as needed for wheezing  4. Dyslipidemia FLP managed by cardiology  5. Anxiety Symptoms of anxiety are in remission on Lexapro   Paul Page [facility] 11/16/2017 10:14 AM

## 2017-11-26 ENCOUNTER — Other Ambulatory Visit: Payer: Self-pay

## 2017-11-26 ENCOUNTER — Telehealth: Payer: Self-pay | Admitting: Cardiovascular Disease

## 2017-11-26 DIAGNOSIS — J4540 Moderate persistent asthma, uncomplicated: Secondary | ICD-10-CM

## 2017-11-26 MED ORDER — FLUTICASONE-SALMETEROL 100-50 MCG/DOSE IN AEPB: INHALATION_SPRAY | RESPIRATORY_TRACT | 3 refills | Status: DC

## 2017-11-26 NOTE — Telephone Encounter (Signed)
Notified patient that Dr. Kirke CorinArida s/w [employer] and Tillman SersVascepa has been approved.

## 2017-11-26 NOTE — Telephone Encounter (Signed)
Refill request for general medication: Advair Diskus  Last office visit: 11/16/2017  Last physical exam: 05/16/2017  Follow-up on file. 03/19/2018

## 2017-12-29 ENCOUNTER — Encounter: Payer: Self-pay | Admitting: Family Medicine

## 2017-12-31 ENCOUNTER — Other Ambulatory Visit: Payer: Self-pay | Admitting: *Deleted

## 2017-12-31 ENCOUNTER — Other Ambulatory Visit: Payer: Self-pay

## 2017-12-31 ENCOUNTER — Telehealth: Payer: Self-pay | Admitting: Cardiovascular Disease

## 2017-12-31 MED ORDER — LOSARTAN POTASSIUM 50 MG PO TABS: 50.0000 mg | ORAL_TABLET | Freq: Every day | ORAL | 1 refills | Status: DC

## 2017-12-31 MED ORDER — ROSUVASTATIN CALCIUM 40 MG PO TABS: 40.0000 mg | ORAL_TABLET | Freq: Every day | ORAL | 1 refills | Status: DC

## 2017-12-31 NOTE — Telephone Encounter (Signed)
Requested Prescriptions   Signed Prescriptions Disp Refills  . rosuvastatin (CRESTOR) 40 MG tablet 90 tablet 1    Sig: Take 1 tablet (40 mg total) by mouth daily.    Authorizing Provider: ARIDA, MUHAMMAD A    Ordering User: LOPEZ, MARINA C  . losartan (COZAAR) 50 MG tablet 90 tablet 1    Sig: Take 1 tablet (50 mg total) by mouth daily.    Authorizing Provider: ARIDA, MUHAMMAD A    Ordering User: LOPEZ, MARINA C    

## 2017-12-31 NOTE — Telephone Encounter (Signed)
°*  STAT* If patient is at the pharmacy, call can be transferred to refill team.   1. Which medications need to be refilled? (please list name of each medication and dose if known)  Losartan and Generic Crestor   2. Which pharmacy/location (including street and city if local pharmacy) is medication to be sent to? [facility] [address]   3. Do they need a 30 day or 90 day supply? 90 day

## 2017-12-31 NOTE — Telephone Encounter (Signed)
Requested Prescriptions   Signed Prescriptions Disp Refills  . rosuvastatin (CRESTOR) 40 MG tablet 90 tablet 1    Sig: Take 1 tablet (40 mg total) by mouth daily.    Authorizing Provider: Lorine BearsARIDA, MUHAMMAD A    Ordering User: Iverson AlaminLOPEZ, Caeleb Batalla C  . losartan (COZAAR) 50 MG tablet 90 tablet 1    Sig: Take 1 tablet (50 mg total) by mouth daily.    Authorizing Provider: Lorine BearsARIDA, MUHAMMAD A    Ordering User: Kendrick FriesLOPEZ, Lesslie Mossa C

## 2017-12-31 NOTE — Telephone Encounter (Signed)
Refill Request for Cholesterol medication. Crestor 40 mg  Last physical: 05/16/2017   Lab Results  Component Value Date   CHOL 248 (H) 08/16/2017   HDL 50 08/16/2017   LDLCALC  08/16/2017     Comment:     . LDL cholesterol not calculated. Triglyceride levels greater than 400 mg/dL invalidate calculated LDL results. . Reference range: <100 . Desirable range <100 mg/dL for primary prevention;   <70 mg/dL for patients with CHD or diabetic patients  with > or = 2 CHD risk factors. Marland Kitchen. LDL-C is now calculated using the Martin-Hopkins  calculation, which is a validated novel method providing  better accuracy than the Friedewald equation in the  estimation of LDL-C.  Horald PollenMartin SS et al. Lenox AhrJAMA. 1610;960(452013;310(19): 2061-2068  (http://education.[facility].com/faq/FAQ164)    TRIG 419 (H) 08/16/2017   CHOLHDL 5.0 (H) 08/16/2017    Follow up visit: 03/19/2018

## 2018-02-11 ENCOUNTER — Other Ambulatory Visit: Payer: Self-pay

## 2018-02-11 DIAGNOSIS — K219 Gastro-esophageal reflux disease without esophagitis: Secondary | ICD-10-CM

## 2018-02-11 MED ORDER — RABEPRAZOLE SODIUM 20 MG PO TBEC: 20.0000 mg | DELAYED_RELEASE_TABLET | Freq: Every day | ORAL | 0 refills | Status: DC

## 2018-02-11 NOTE — Telephone Encounter (Signed)
Refill request for general medication: Aciphex 20 mg  Last office visit: 11/16/2017  Last physical exam: 05/16/2017   Follow-ups on file. 03/19/2018

## 2018-03-14 ENCOUNTER — Other Ambulatory Visit
Admission: RE | Admit: 2018-03-14 | Discharge: 2018-03-14 | Disposition: A | Discharge status: Home / Self Care | Payer: BLUE CROSS/BLUE SHIELD | Source: Ambulatory Visit | Attending: Cardiovascular Disease | Admitting: Cardiovascular Disease

## 2018-03-14 ENCOUNTER — Telehealth: Payer: Self-pay | Admitting: *Deleted

## 2018-03-14 DIAGNOSIS — E782 Mixed hyperlipidemia: Secondary | ICD-10-CM | POA: Insufficient documentation

## 2018-03-14 LAB — HEPATIC FUNCTION PANEL
ALK PHOS: 61 U/L (ref 38–126)
ALT: 23 U/L (ref 17–63)
AST: 31 U/L (ref 15–41)
Albumin: 4.4 g/dL (ref 3.5–5.0)
Bilirubin, Direct: <0.1 mg/dL — ABNORMAL LOW (ref 0.1–0.5)
Total Bilirubin: 0.8 mg/dL (ref 0.3–1.2)
Total Protein: 7.6 g/dL (ref 6.5–8.1)

## 2018-03-14 LAB — LIPID PANEL
Cholesterol: 174 mg/dL (ref 0–200)
HDL: 61 mg/dL (ref >40)
LDL Cholesterol: 86 mg/dL (ref 0–99)
Total CHOL/HDL Ratio: 2.9 RATIO
Triglycerides: 133 mg/dL (ref <150)
VLDL: 27 mg/dL (ref 0–40)

## 2018-03-14 MED ORDER — EZETIMIBE 10 MG PO TABS: 10.0000 mg | ORAL_TABLET | Freq: Every day | ORAL | 3 refills | Status: DC

## 2018-03-14 NOTE — Telephone Encounter (Signed)
-----   Message from Iran OuchMuhammad A Arida, MD sent at 03/14/2018 11:18 AM EDT ----- Inform patient that labs were normal.  Triglyceride improved to normal with Vascepa.  Cholesterol also improved but still not at target.  Continue same medications but add Zetia 10 mg daily.  Repeat same labs in 2 months.

## 2018-03-14 NOTE — Telephone Encounter (Signed)
Patient made aware of results and verbalized understanding. Zetia 10 mg has been sent into the pharmacy and labs ordered for two months.  Notes recorded by Iran OuchArida, Muhammad A, MD on 03/14/2018 at 11:18 AM EDT Inform patient that labs were normal. Triglyceride improved to normal with Vascepa. Cholesterol also improved but still not at target. Continue same medications but add Zetia 10 mg daily. Repeat same labs in 2 months.

## 2018-03-19 ENCOUNTER — Ambulatory Visit: Payer: BLUE CROSS/BLUE SHIELD | Admitting: Family Medicine

## 2018-03-19 ENCOUNTER — Encounter: Payer: Self-pay | Admitting: Family Medicine

## 2018-03-19 VITALS — BP 120/70 | HR 74 | Resp 16 | Ht 70.0 in | Wt 192.3 lb

## 2018-03-19 DIAGNOSIS — R2232 Localized swelling, mass and lump, left upper limb: Secondary | ICD-10-CM | POA: Diagnosis not present

## 2018-03-19 DIAGNOSIS — I10 Essential (primary) hypertension: Secondary | ICD-10-CM

## 2018-03-19 DIAGNOSIS — F419 Anxiety disorder, unspecified: Secondary | ICD-10-CM

## 2018-03-19 DIAGNOSIS — K648 Other hemorrhoids: Secondary | ICD-10-CM | POA: Diagnosis not present

## 2018-03-19 DIAGNOSIS — Z23 Encounter for immunization: Secondary | ICD-10-CM

## 2018-03-19 DIAGNOSIS — E785 Hyperlipidemia, unspecified: Secondary | ICD-10-CM | POA: Diagnosis not present

## 2018-03-19 DIAGNOSIS — J4540 Moderate persistent asthma, uncomplicated: Secondary | ICD-10-CM

## 2018-03-19 DIAGNOSIS — I2510 Atherosclerotic heart disease of native coronary artery without angina pectoris: Secondary | ICD-10-CM

## 2018-03-19 MED ORDER — ALBUTEROL SULFATE HFA 108 (90 BASE) MCG/ACT IN AERS: INHALATION_SPRAY | RESPIRATORY_TRACT | 0 refills | Status: DC

## 2018-03-19 MED ORDER — FLUTICASONE FUROATE-VILANTEROL 100-25 MCG/INH IN AEPB: 1.0000 | INHALATION_SPRAY | Freq: Every day | RESPIRATORY_TRACT | 5 refills | Status: DC

## 2018-03-19 MED ORDER — ESCITALOPRAM OXALATE 10 MG PO TABS: 10.0000 mg | ORAL_TABLET | Freq: Every day | ORAL | 1 refills | Status: DC

## 2018-03-19 NOTE — Addendum Note (Signed)
Addended by: Tommie RaymondBOOKER, CRYSTAL L on: 03/19/2018 10:16 AM   Modules accepted: Orders

## 2018-03-19 NOTE — Progress Notes (Addendum)
Name: Paul Page   MRN: [id number]    DOB: 12/10/1955   Date:03/19/2018       Progress Note  Subjective  Chief Complaint  Chief Complaint  Patient presents with  . Hypertension  . Asthma  . Gastroesophageal Reflux  . Anxiety  . Medication Refill  . Cyst    on his left hand that seem to have grown in size x 6 months. He has pain when he pick up something heavy or moves it a certain way.    HPI  HTN: taking medication, denies chest pain or palpitation.   Hyperlipidemia: he is taking Zetia, Statin therapy, Zetia, Vascepa. He has CAD, denies chest pain or palpitation.   CAD: s/p stent placement, under the care of Dr Kirke Corin, on aspirin , plavix and statin therapy. No pain at this time, normal exercise tolerance.   Nodule on finger: noticed it about 6 months ago, grew rapidly but stopped now, notices more when picking up something ( like a box), mild discomfort/ pain.   Anxiety: taking Lexapro, has to take it at night because it makes him feel tired, controlling symptoms  Fatigue: states working out three days a week, self employed, takes naps at the end of the day. He states snores loudly. ESS 3     Patient Active Problem List   Diagnosis Date Noted  . Hypertension 04/12/2015  . Dyslipidemia 03/22/2015  . GERD (gastroesophageal reflux disease) 03/22/2015  . Asthma 03/22/2015  . Anxiety 03/22/2015  . Family history of ischemic heart disease (IHD) 10/24/2012  . History of fracture of rib 09/04/2011  . Traumatic pneumothorax 09/04/2011  . Coronary artery disease involving native coronary artery of native heart without angina pectoris 08/01/2007    Past Surgical History:  Procedure Laterality Date  . ANKLE FRACTURE SURGERY    . CARDIAC CATHETERIZATION  2011  . CARDIAC CATHETERIZATION  2008  . CORONARY STENT PLACEMENT  2008 & 2011    Family History  Problem Relation Age of Onset  . Parkinson's disease Mother   . Cancer Father     Social History   Socioeconomic  History  . Marital status: Married    Spouse name: Not on file  . Number of children: Not on file  . Years of education: Not on file  . Highest education level: Not on file  Occupational History  . Not on file  Social Needs  . Financial resource strain: Not on file  . Food insecurity:    Worry: Not on file    Inability: Not on file  . Transportation needs:    Medical: Not on file    Non-medical: Not on file  Tobacco Use  . Smoking status: Never Smoker  . Smokeless tobacco: Never Used  Substance and Sexual Activity  . Alcohol use: Yes    Alcohol/week: 0.0 oz    Comment: occasional wine  . Drug use: No  . Sexual activity: Yes  Lifestyle  . Physical activity:    Days per week: Not on file    Minutes per session: Not on file  . Stress: Not on file  Relationships  . Social connections:    Talks on phone: Not on file    Gets together: Not on file    Attends religious service: Not on file    Active member of club or organization: Not on file    Attends meetings of clubs or organizations: Not on file    Relationship status: Not on file  . Intimate  partner violence:    Fear of current or ex partner: Not on file    Emotionally abused: Not on file    Physically abused: Not on file    Forced sexual activity: Not on file  Other Topics Concern  . Not on file  Social History Narrative  . Not on file     Current Outpatient Medications:  .  albuterol (PROAIR HFA) 108 (90 Base) MCG/ACT inhaler, INHALE 2 PUFFS INTO THE LUNGS EVERY 6 (SIX) HOURS AS NEEDED FOR WHEEZING OR SHORTNESS OF BREATH., Disp: 1 Inhaler, Rfl: 0 .  clopidogrel (PLAVIX) 75 MG tablet, Take 1 tablet (75 mg total) daily by mouth., Disp: 90 tablet, Rfl: 1 .  escitalopram (LEXAPRO) 10 MG tablet, Take 1 tablet (10 mg total) by mouth daily., Disp: 90 tablet, Rfl: 1 .  ezetimibe (ZETIA) 10 MG tablet, Take 1 tablet (10 mg total) by mouth daily., Disp: 90 tablet, Rfl: 3 .  Icosapent Ethyl (VASCEPA) 1 g CAPS, Take 2  tablets by mouth 2 (two) times daily with a meal., Disp: 360 capsule, Rfl: 3 .  losartan (COZAAR) 50 MG tablet, Take 1 tablet (50 mg total) by mouth daily., Disp: 90 tablet, Rfl: 1 .  Multiple Vitamins-Minerals (CENTRUM SILVER PO), Take 1 tablet by mouth daily., Disp: , Rfl:  .  nebivolol (BYSTOLIC) 5 MG tablet, Take 1 tablet (5 mg total) by mouth daily., Disp: 90 tablet, Rfl: 3 .  RABEprazole (ACIPHEX) 20 MG tablet, Take 1 tablet (20 mg total) by mouth daily., Disp: 90 tablet, Rfl: 0 .  rosuvastatin (CRESTOR) 40 MG tablet, Take 1 tablet (40 mg total) by mouth daily., Disp: 90 tablet, Rfl: 1 .  fluticasone furoate-vilanterol (BREO ELLIPTA) 100-25 MCG/INH AEPB, Inhale 1 puff into the lungs daily., Disp: 60 each, Rfl: 5  Allergies  Allergen Reactions  . Molds & Smuts Shortness Of Breath  . Fish Oil Hives  . Peanut Oil   . Tetracyclines & Related Other (See Comments)    Intolerance - stomach upset     ROS  Constitutional: Negative for fever or weight change.  Respiratory: Negative for cough and shortness of breath.   Cardiovascular: Negative for chest pain or palpitations.  Gastrointestinal: Negative for abdominal pain, no bowel changes.  Musculoskeletal: Negative for gait problem or joint swelling.  Skin: Negative for rash.  Neurological: Negative for dizziness or headache.  No other specific complaints in a complete review of systems (except as listed in HPI above).  Objective  Vitals:   03/19/18 0842  BP: 120/70  Pulse: 74  Resp: 16  SpO2: 96%  Weight: 192 lb 4.8 oz (87.2 kg)  Height: 5\' 10"  (1.778 m)    Body mass index is 27.59 kg/m.  Physical Exam  Constitutional: Patient appears well-developed and well-nourished. Overweight.  No distress.  HEENT: head atraumatic, normocephalic, pupils equal and reactive to light,  neck supple, throat within normal limits Cardiovascular: Normal rate, regular rhythm and normal heart sounds.  No murmur heard. No BLE  edema. Pulmonary/Chest: Effort normal and breath sounds normal. No respiratory distress. Abdominal: Soft.  There is no tenderness. Psychiatric: Patient has a normal mood and affect. behavior is normal. Judgment and thought content normal. Muscular Skeletal: nodule that moves freely on ventral aspect of left ring finger, mild discomfort with pressure , no redness   Recent Results (from the past 2160 hour(s))  Hepatic function panel     Status: Abnormal   Collection Time: 03/14/18  9:17 AM  Result Value  Ref Range   Total Protein 7.6 6.5 - 8.1 g/dL   Albumin 4.4 3.5 - 5.0 g/dL   AST 31 15 - 41 U/L   ALT 23 17 - 63 U/L   Alkaline Phosphatase 61 38 - 126 U/L   Total Bilirubin 0.8 0.3 - 1.2 mg/dL   Bilirubin, Direct <1.6 (L) 0.1 - 0.5 mg/dL   Indirect Bilirubin NOT CALCULATED 0.3 - 0.9 mg/dL    Comment: Performed at [facility], [address] [place], [place] [address]  Lipid Profile     Status: None   Collection Time: 03/14/18  9:17 AM  Result Value Ref Range   Cholesterol 174 0 - 200 mg/dL   Triglycerides 454 <098 mg/dL   HDL 61 >11 mg/dL   Total CHOL/HDL Ratio 2.9 RATIO   VLDL 27 0 - 40 mg/dL   LDL Cholesterol 86 0 - 99 mg/dL    Comment:        Total Cholesterol/HDL:CHD Risk Coronary Heart Disease Risk Table                     Men   Women  1/2 Average Risk   3.4   3.3  Average Risk       5.0   4.4  2 X Average Risk   9.6   7.1  3 X Average Risk  23.4   11.0        Use the calculated Patient Ratio above and the CHD Risk Table to determine the patient's CHD Risk.        ATP III CLASSIFICATION (LDL):  <100     mg/dL   Optimal  914-782  mg/dL   Near or Above                    Optimal  130-159  mg/dL   Borderline  956-213  mg/dL   High  >086     mg/dL   Very High Performed at [facility], [address] [place], [place] [address]       PHQ2/9: Depression screen [facility] 2/9 03/19/2018 11/16/2017 08/16/2017 05/16/2017 03/21/2017  Decreased Interest 0 0  0 0 0  Down, Depressed, Hopeless 0 0 0 0 0  PHQ - 2 Score 0 0 0 0 0  Altered sleeping 0 - - - -  Tired, decreased energy 0 - - - -  Change in appetite 0 - - - -  Feeling bad or failure about yourself  0 - - - -  Trouble concentrating 0 - - - -  Moving slowly or fidgety/restless 0 - - - -  Suicidal thoughts 0 - - - -  PHQ-9 Score 0 - - - -  Difficult doing work/chores Not difficult at all - - - -     Fall Risk: Fall Risk  03/19/2018 11/16/2017 08/16/2017 05/16/2017 03/21/2017  Falls in the past year? No Yes Yes Yes No  Number falls in past yr: - 1 1 1  -  Injury with Fall? - Yes Yes Yes -  Comment - - - sprained left ankle  -  Risk for fall due to : - - - Impaired balance/gait -  Follow up - - Falls prevention discussed Falls evaluation completed -     Functional Status Survey: Is the patient deaf or have difficulty hearing?: No Does the patient have difficulty seeing, even when wearing glasses/contacts?: No Does the patient have difficulty concentrating, remembering, or making decisions?: No  Does the patient have difficulty walking or climbing stairs?: No Does the patient have difficulty dressing or bathing?: No Does the patient have difficulty doing errands alone such as visiting a doctor's office or shopping?: No   Assessment & Plan  1. Coronary artery disease involving native coronary artery of native heart without angina pectoris  Continue follow up with Dr. Kirke Corin, denies chest pain   2. Moderate persistent asthma without complication  He states has difficulty using Advair at night, we will try switching to breo  - albuterol (PROAIR HFA) 108 (90 Base) MCG/ACT inhaler; INHALE 2 PUFFS INTO THE LUNGS EVERY 6 (SIX) HOURS AS NEEDED FOR WHEEZING OR SHORTNESS OF BREATH.  Dispense: 1 Inhaler; Refill: 0 - fluticasone furoate-vilanterol (BREO ELLIPTA) 100-25 MCG/INH AEPB; Inhale 1 puff into the lungs daily.  Dispense: 60 each; Refill: 5  3. Anxiety  Stable on medication  -  escitalopram (LEXAPRO) 10 MG tablet; Take 1 tablet (10 mg total) by mouth daily.  Dispense: 90 tablet; Refill: 1  4. Essential hypertension  At goal   5. Dyslipidemia  On medication, reviewed last labs   6. Nodule of finger of left hand  Reassurance for now, he will call back if he decides to see Ortho   7. Internal hemorrhoid  Long history, states symptoms re-started after working out at the gym    8. Need for Tdap vaccination  - Tdap vaccine greater than or equal to 7yo IM

## 2018-04-17 ENCOUNTER — Encounter: Payer: Self-pay | Admitting: Family Medicine

## 2018-04-17 ENCOUNTER — Ambulatory Visit: Payer: BLUE CROSS/BLUE SHIELD | Admitting: Family Medicine

## 2018-04-17 ENCOUNTER — Ambulatory Visit: Payer: Self-pay | Admitting: *Deleted

## 2018-04-17 VITALS — BP 124/72 | HR 74 | Temp 98.6°F | Resp 18 | Ht 70.0 in | Wt 193.5 lb

## 2018-04-17 DIAGNOSIS — J4541 Moderate persistent asthma with (acute) exacerbation: Secondary | ICD-10-CM

## 2018-04-17 DIAGNOSIS — J019 Acute sinusitis, unspecified: Secondary | ICD-10-CM

## 2018-04-17 MED ORDER — BENZONATATE 100 MG PO CAPS: 100.0000 mg | ORAL_CAPSULE | Freq: Three times a day (TID) | ORAL | 0 refills | Status: DC | PRN

## 2018-04-17 MED ORDER — AMOXICILLIN-POT CLAVULANATE 875-125 MG PO TABS: 1.0000 | ORAL_TABLET | Freq: Two times a day (BID) | ORAL | 0 refills | Status: AC

## 2018-04-17 MED ORDER — ALBUTEROL SULFATE (2.5 MG/3ML) 0.083% IN NEBU: 2.5000 mg | INHALATION_SOLUTION | Freq: Four times a day (QID) | RESPIRATORY_TRACT | 1 refills | Status: DC | PRN

## 2018-04-17 MED ORDER — PREDNISONE 10 MG PO TABS: ORAL_TABLET | ORAL | 0 refills | Status: AC

## 2018-04-17 MED ORDER — FLUTICASONE PROPIONATE 50 MCG/ACT NA SUSP: 2.0000 | Freq: Every day | NASAL | 0 refills | Status: DC

## 2018-04-17 MED ORDER — ALBUTEROL SULFATE HFA 108 (90 BASE) MCG/ACT IN AERS: INHALATION_SPRAY | RESPIRATORY_TRACT | 0 refills | Status: DC

## 2018-04-17 NOTE — Patient Instructions (Signed)
Asthma Attack Prevention, Adult °Although you may not be able to control the fact that you have asthma, you can take actions to prevent episodes of asthma (asthma attacks). These actions include: °· Creating a written plan for managing and treating your asthma attacks (asthma action plan). °· Monitoring your asthma. °· Avoiding things that can irritate your airways or make your asthma symptoms worse (asthma triggers). °· Taking your medicines as directed. °· Acting quickly if you have signs or symptoms of an asthma attack. ° °What are some ways to prevent an asthma attack? °Create a plan °Work with your health care provider to create an asthma action plan. This plan should include: °· A list of your asthma triggers and how to avoid them. °· A list of symptoms that you experience during an asthma attack. °· Information about when to take medicine and how much medicine to take. °· Information to help you understand your peak flow measurements. °· Contact information for your health care providers. °· Daily actions that you can take to control asthma. ° °Monitor your asthma ° °To monitor your asthma: °· Use your peak flow meter every morning and every evening for 2-3 weeks. Record the results in a journal. A drop in your peak flow numbers on one or more days may mean that you are starting to have an asthma attack, even if you are not having symptoms. °· When you have asthma symptoms, write them down in a journal. ° °Avoid asthma triggers ° °Work with your health care provider to find out what your asthma triggers are. This can be done by: °· Being tested for allergies. °· Keeping a journal that notes when asthma attacks occur and what may have contributed to them. °· Asking your health care provider whether other medical conditions make your asthma worse. ° °Common asthma triggers include: °· Dust. °· Smoke. This includes campfire smoke and secondhand smoke from tobacco products. °· Pet dander. °· Trees, grasses or  pollens. °· Very cold, dry, or humid air. °· Mold. °· Foods that contain high amounts of sulfites. °· Strong smells. °· Engine exhaust and air pollution. °· Aerosol sprays and fumes from household cleaners. °· Household pests and their droppings, including dust mites and cockroaches. °· Certain medicines, including NSAIDs. ° °Once you have determined your asthma triggers, take steps to avoid them. Depending on your triggers, you may be able to reduce the chance of an asthma attack by: °· Keeping your home clean. Have someone dust and vacuum your home for you 1 or 2 times a week. If possible, have them use a high-efficiency particulate arrestance (HEPA) vacuum. °· Washing your sheets weekly in hot water. °· Using allergy-proof mattress covers and casings on your bed. °· Keeping pets out of your home. °· Taking care of mold and water problems in your home. °· Avoiding areas where people smoke. °· Avoiding using strong perfumes or odor sprays. °· Avoid spending a lot of time outdoors when pollen counts are high and on very windy days. °· Talking with your health care provider before stopping or starting any new medicines. ° °Medicines °Take over-the-counter and prescription medicines only as told by your health care provider. Many asthma attacks can be prevented by carefully following your medicine schedule. Taking your medicines correctly is especially important when you cannot avoid certain asthma triggers. Even if you are doing well, do not stop taking your medicine and do not take less medicine. °Act quickly °If an asthma attack happens, acting quickly   can decrease how severe it is and how long it lasts. Take these actions: °· Pay attention to your symptoms. If you are coughing, wheezing, or having difficulty breathing, do not wait to see if your symptoms go away on their own. Follow your asthma action plan. °· If you have followed your asthma action plan and your symptoms are not improving, call your health care  provider or seek immediate medical care at the nearest hospital. ° °It is important to write down how often you need to use your fast-acting rescue inhaler. You can track how often you use an inhaler in your journal. If you are using your rescue inhaler more often, it may mean that your asthma is not under control. Adjusting your asthma treatment plan may help you to prevent future asthma attacks and help you to gain better control of your condition. °How can I prevent an asthma attack when I exercise? ° °Exercise is a common asthma trigger. To prevent asthma attacks during exercise: °· Follow advice from your health care provider about whether you should use your fast-acting inhaler before exercising. Many people with asthma experience exercise-induced bronchoconstriction (EIB). This condition often worsens during vigorous exercise in cold, humid, or dry environments. Usually, people with EIB can stay very active by using a fast-acting inhaler before exercising. °· Avoid exercising outdoors in very cold or humid weather. °· Avoid exercising outdoors when pollen counts are high. °· Warm up and cool down when exercising. °· Stop exercising right away if asthma symptoms start. ° °Consider taking part in exercises that are less likely to cause asthma symptoms such as: °· Indoor swimming. °· Biking. °· Walking. °· Hiking. °· Playing football. ° °This information is not intended to replace advice given to you by your health care provider. Make sure you discuss any questions you have with your health care provider. °Document Released: 09/06/2009 Document Revised: 05/19/2016 Document Reviewed: 03/04/2016 °Elsevier Interactive Patient Education © 2018 [employer] ° ° °Cough, Adult °A cough helps to clear your throat and lungs. A cough may last only 2-3 weeks (acute), or it may last longer than 8 weeks (chronic). Many different things can cause a cough. A cough may be a sign of an illness or another medical  condition. °Follow these instructions at home: °· Pay attention to any changes in your cough. °· Take medicines only as told by your doctor. °? If you were prescribed an antibiotic medicine, take it as told by your doctor. Do not stop taking it even if you start to feel better. °? Talk with your doctor before you try using a cough medicine. °· Drink enough fluid to keep your pee (urine) clear or pale yellow. °· If the air is dry, use a cold steam vaporizer or humidifier in your home. °· Stay away from things that make you cough at work or at home. °· If your cough is worse at night, try using extra pillows to raise your head up higher while you sleep. °· Do not smoke, and try not to be around smoke. If you need help quitting, ask your doctor. °· Do not have caffeine. °· Do not drink alcohol. °· Rest as needed. °Contact a doctor if: °· You have new problems (symptoms). °· You cough up yellow fluid (pus). °· Your cough does not get better after 2-3 weeks, or your cough gets worse. °· Medicine does not help your cough and you are not sleeping well. °· You have pain that gets worse or pain   that is not helped with medicine. °· You have a fever. °· You are losing weight and you do not know why. °· You have night sweats. °Get help right away if: °· You cough up blood. °· You have trouble breathing. °· Your heartbeat is very fast. °This information is not intended to replace advice given to you by your health care provider. Make sure you discuss any questions you have with your health care provider. °Document Released: 06/01/2011 Document Revised: 02/24/2016 Document Reviewed: 11/25/2014 °Elsevier Interactive Patient Education © 2018 [employer] ° °

## 2018-04-17 NOTE — Telephone Encounter (Signed)
He was seen today by Maurice SmallEmily Boyce

## 2018-04-17 NOTE — Telephone Encounter (Signed)
Please review and advise.

## 2018-04-17 NOTE — Telephone Encounter (Addendum)
Patient's wife phoned. Patient has been short of breath for 2 weeks. Reports he is wheezing. Denies fever/CP.He is currently at the gym using the sauna to see if it helps. Uses Breo Ellipta and has been using proair inhaler along with nebulizer treatments twice daily.  He is coughing with small amount of phlegm. No availability with PCP. Afternoon appointment with Hardie ShackletonE. Boyce, FNP for 1:00pm. Reason for Disposition . [1] MILD difficulty breathing (e.g., minimal/no SOB at rest, SOB with walking, pulse <100) AND [2] NEW-onset or WORSE than normal  Answer Assessment - Initial Assessment Questions 1. RESPIRATORY STATUS: "Describe your breathing?" (e.g., wheezing, shortness of breath, unable to speak, severe coughing)      Wheezing, short of breath, coughing  2. ONSET: "When did this breathing problem begin?"     2 weeks. 3. PATTERN "Does the difficult breathing come and go, or has it been constant since it started?"      Stopped going to gym last week, improved but now worse 4. SEVERITY: "How bad is your breathing?" (e.g., mild, moderate, severe)    - MILD: No SOB at rest, mild SOB with walking, speaks normally in sentences, can lay down, no retractions, pulse < 100.    - MODERATE: SOB at rest, SOB with minimal exertion and prefers to sit, cannot lie down flat, speaks in phrases, mild retractions, audible wheezing, pulse 100-120.    - SEVERE: Very SOB at rest, speaks in single words, struggling to breathe, sitting hunched forward, retractions, pulse > 120      SOB when speaks.  5. RECURRENT SYMPTOM: "Have you had difficulty breathing before?" If so, ask: "When was the last time?" and "What happened that time?"      Yes, asthma 6. CARDIAC HISTORY: "Do you have any history of heart disease?" (e.g., heart attack, angina, bypass surgery, angioplasty)     5 heart stents. 7. LUNG HISTORY: "Do you have any history of lung disease?"  (e.g., pulmonary embolus, asthma, emphysema)    asthma 8. CAUSE: "What do  you think is causing the breathing problem?"      asthma 9. OTHER SYMPTOMS: "Do you have any other symptoms? (e.g., dizziness, runny nose, cough, chest pain, fever)    no 10. PREGNANCY: "Is there any chance you are pregnant?" "When was your last menstrual period?"       no 11. TRAVEL: "Have you traveled out of the country in the last month?" (e.g., travel history, exposures)       no  Protocols used: BREATHING DIFFICULTY-A-AH

## 2018-04-17 NOTE — Progress Notes (Signed)
Name: Paul Page   MRN: [id number]    DOB: October 21, 1955   Date:04/17/2018       Progress Note  Subjective  Chief Complaint  Chief Complaint  Patient presents with  . Shortness of Breath    dry cough, nasal drainage for 10 days    HPI  Pt presents with concern for shortness of breath and nasal congestion.  Shortness of breath started about 2 weeks ago, dry cough and sinus congestion started about 10 days ago.  Cough is non-productive.  He has been using Nebulizer every 4 hours at home, DM cough syrup; taking advair daily (He's finishing out the advair, then plans to switch to [facility]), and nothing is helping. Cough is worse in the morning and at night, though it does not affect his sleep.  Has HTN, BP is at goal today; Pt is not diabetic.  Patient Active Problem List   Diagnosis Date Noted  . Hypertension 04/12/2015  . Dyslipidemia 03/22/2015  . GERD (gastroesophageal reflux disease) 03/22/2015  . Asthma 03/22/2015  . Anxiety 03/22/2015  . Family history of ischemic heart disease (IHD) 10/24/2012  . History of fracture of rib 09/04/2011  . Traumatic pneumothorax 09/04/2011  . Coronary artery disease involving native coronary artery of native heart without angina pectoris 08/01/2007    Social History   Tobacco Use  . Smoking status: Never Smoker  . Smokeless tobacco: Never Used  Substance Use Topics  . Alcohol use: Yes    Alcohol/week: 0.0 oz    Comment: occasional wine     Current Outpatient Medications:  .  albuterol (PROAIR HFA) 108 (90 Base) MCG/ACT inhaler, INHALE 2 PUFFS INTO THE LUNGS EVERY 6 (SIX) HOURS AS NEEDED FOR WHEEZING OR SHORTNESS OF BREATH., Disp: 1 Inhaler, Rfl: 0 .  clopidogrel (PLAVIX) 75 MG tablet, Take 1 tablet (75 mg total) daily by mouth., Disp: 90 tablet, Rfl: 1 .  escitalopram (LEXAPRO) 10 MG tablet, Take 1 tablet (10 mg total) by mouth daily., Disp: 90 tablet, Rfl: 1 .  ezetimibe (ZETIA) 10 MG tablet, Take 1 tablet (10 mg total) by mouth daily.,  Disp: 90 tablet, Rfl: 3 .  Icosapent Ethyl (VASCEPA) 1 g CAPS, Take 2 tablets by mouth 2 (two) times daily with a meal., Disp: 360 capsule, Rfl: 3 .  Multiple Vitamins-Minerals (CENTRUM SILVER PO), Take 1 tablet by mouth daily., Disp: , Rfl:  .  nebivolol (BYSTOLIC) 5 MG tablet, Take 1 tablet (5 mg total) by mouth daily., Disp: 90 tablet, Rfl: 3 .  RABEprazole (ACIPHEX) 20 MG tablet, Take 1 tablet (20 mg total) by mouth daily., Disp: 90 tablet, Rfl: 0 .  fluticasone furoate-vilanterol (BREO ELLIPTA) 100-25 MCG/INH AEPB, Inhale 1 puff into the lungs daily. (Patient not taking: Reported on 04/17/2018), Disp: 60 each, Rfl: 5 .  losartan (COZAAR) 50 MG tablet, Take 1 tablet (50 mg total) by mouth daily., Disp: 90 tablet, Rfl: 1 .  rosuvastatin (CRESTOR) 40 MG tablet, Take 1 tablet (40 mg total) by mouth daily., Disp: 90 tablet, Rfl: 1  Allergies  Allergen Reactions  . Molds & Smuts Shortness Of Breath  . Fish Oil Hives  . Peanut Oil   . Tetracyclines & Related Other (See Comments)    Intolerance - stomach upset    ROS  Ten systems reviewed and is negative except as mentioned in HPI  Objective  Vitals:   04/17/18 1256  BP: 124/72  Pulse: 74  Resp: 18  Temp: 98.6 F (37 C)  TempSrc:  Oral  SpO2: 96%  Weight: 193 lb 8 oz (87.8 kg)  Height: 5\' 10"  (1.778 m)   Body mass index is 27.76 kg/m.  Nursing Note and Vital Signs reviewed.  Physical Exam  Constitutional: Patient appears well-developed and well-nourished. Obese No distress.  HEENT: head atraumatic, normocephalic, pupils equal and reactive to light, ears WNL, neck supple, throat within normal limits Cardiovascular: Normal rate, regular rhythm and normal heart sounds.  No murmur heard. No BLE edema. Pulmonary/Chest: Effort normal and breath sounds diminished in bilateral bases with mild expiratory wheezes. No respiratory distress. Psychiatric: Patient has a normal mood and affect. behavior is normal. Judgment and thought  content normal.   No results found for this or any previous visit (from the past 72 hour(s)).  Assessment & Plan  1. Moderate persistent asthma with acute exacerbation - albuterol (PROAIR HFA) 108 (90 Base) MCG/ACT inhaler; INHALE 2 PUFFS INTO THE LUNGS EVERY 6 (SIX) HOURS AS NEEDED FOR WHEEZING OR SHORTNESS OF BREATH.  Dispense: 1 Inhaler; Refill: 0 - benzonatate (TESSALON) 100 MG capsule; Take 1 capsule (100 mg total) by mouth 3 (three) times daily as needed for cough.  Dispense: 30 capsule; Refill: 0 - albuterol (PROVENTIL) (2.5 MG/3ML) 0.083% nebulizer solution; Take 3 mLs (2.5 mg total) by nebulization every 6 (six) hours as needed for wheezing or shortness of breath.  Dispense: 150 mL; Refill: 1 - predniSONE (DELTASONE) 10 MG tablet; Take 5 tablets (50 mg total) by mouth daily with breakfast for 1 day, THEN 4 tablets (40 mg total) daily with breakfast for 1 day, THEN 3 tablets (30 mg total) daily with breakfast for 1 day, THEN 2 tablets (20 mg total) daily with breakfast for 1 day, THEN 1 tablet (10 mg total) daily with breakfast for 2 days.  Dispense: 16 tablet; Refill: 0 - Stop Advair, start breo.  2. Acute non-recurrent sinusitis, unspecified location - amoxicillin-clavulanate (AUGMENTIN) 875-125 MG tablet; Take 1 tablet by mouth 2 (two) times daily for 10 days.  Dispense: 20 tablet; Refill: 0 - fluticasone (FLONASE) 50 MCG/ACT nasal spray; Place 2 sprays into both nostrils daily.  Dispense: 16 g; Refill: 0  -Red flags and when to present for emergency care or RTC including fever >101.31F, chest pain, shortness of breath, new/worsening/un-resolving symptoms, reviewed with patient at time of visit. Follow up and care instructions discussed and provided in AVS.

## 2018-05-09 ENCOUNTER — Other Ambulatory Visit: Payer: Self-pay | Admitting: Family Medicine

## 2018-05-09 DIAGNOSIS — J4541 Moderate persistent asthma with (acute) exacerbation: Secondary | ICD-10-CM

## 2018-05-09 NOTE — Telephone Encounter (Signed)
Please call and ask pt how he's feeling. I gave albuterol refill on 04/17/2018.  If he is out already and still not improving I need to see him back in office. If he is improving, I will approve medication and he does not need to return to office for follow up.

## 2018-05-14 ENCOUNTER — Other Ambulatory Visit: Payer: Self-pay | Admitting: Family Medicine

## 2018-05-14 DIAGNOSIS — J019 Acute sinusitis, unspecified: Secondary | ICD-10-CM

## 2018-05-14 NOTE — Telephone Encounter (Signed)
Flonase was for sinusitis symptomatic care only.

## 2018-05-28 ENCOUNTER — Other Ambulatory Visit: Payer: Self-pay

## 2018-05-28 DIAGNOSIS — J4541 Moderate persistent asthma with (acute) exacerbation: Secondary | ICD-10-CM

## 2018-05-29 MED ORDER — ALBUTEROL SULFATE HFA 108 (90 BASE) MCG/ACT IN AERS
INHALATION_SPRAY | RESPIRATORY_TRACT | 1 refills | Status: DC
Start: 2018-05-29 — End: 2018-10-16

## 2018-05-30 ENCOUNTER — Other Ambulatory Visit: Payer: Self-pay | Admitting: Family Medicine

## 2018-05-30 DIAGNOSIS — I2510 Atherosclerotic heart disease of native coronary artery without angina pectoris: Secondary | ICD-10-CM

## 2018-05-31 NOTE — Telephone Encounter (Signed)
Refill request for general medication. Plavix to [facility]   Last office visit: 03/19/2018   Follow up on 09/19/2018

## 2018-06-19 ENCOUNTER — Other Ambulatory Visit: Payer: Self-pay | Admitting: Cardiovascular Disease

## 2018-06-23 ENCOUNTER — Other Ambulatory Visit: Payer: Self-pay | Admitting: Family Medicine

## 2018-06-23 DIAGNOSIS — K219 Gastro-esophageal reflux disease without esophagitis: Secondary | ICD-10-CM

## 2018-06-25 ENCOUNTER — Other Ambulatory Visit: Payer: Self-pay | Admitting: Cardiovascular Disease

## 2018-06-26 ENCOUNTER — Other Ambulatory Visit: Payer: Self-pay | Admitting: Cardiovascular Disease

## 2018-07-04 ENCOUNTER — Other Ambulatory Visit: Payer: Self-pay | Admitting: Cardiovascular Disease

## 2018-08-23 ENCOUNTER — Encounter: Payer: Self-pay | Admitting: Family Medicine

## 2018-09-16 ENCOUNTER — Other Ambulatory Visit: Payer: Self-pay | Admitting: Family Medicine

## 2018-09-16 DIAGNOSIS — F419 Anxiety disorder, unspecified: Secondary | ICD-10-CM

## 2018-09-18 ENCOUNTER — Other Ambulatory Visit: Payer: Self-pay | Admitting: Family Medicine

## 2018-09-18 DIAGNOSIS — K219 Gastro-esophageal reflux disease without esophagitis: Secondary | ICD-10-CM

## 2018-09-18 NOTE — Telephone Encounter (Signed)
Refill request for general medication: Aciphex 20mg    Last office visit: 04/17/2018  Last physical exam: 05/16/17  Follow-ups on file. 09/19/2018

## 2018-09-19 ENCOUNTER — Encounter: Payer: Self-pay | Admitting: Family Medicine

## 2018-09-19 ENCOUNTER — Ambulatory Visit (INDEPENDENT_AMBULATORY_CARE_PROVIDER_SITE_OTHER): Payer: BLUE CROSS/BLUE SHIELD | Admitting: Family Medicine

## 2018-09-19 VITALS — BP 122/70 | HR 79 | Temp 97.9°F | Resp 14 | Ht 70.0 in | Wt 198.5 lb

## 2018-09-19 DIAGNOSIS — J4540 Moderate persistent asthma, uncomplicated: Secondary | ICD-10-CM

## 2018-09-19 DIAGNOSIS — E559 Vitamin D deficiency, unspecified: Secondary | ICD-10-CM

## 2018-09-19 DIAGNOSIS — F419 Anxiety disorder, unspecified: Secondary | ICD-10-CM

## 2018-09-19 DIAGNOSIS — Z125 Encounter for screening for malignant neoplasm of prostate: Secondary | ICD-10-CM

## 2018-09-19 DIAGNOSIS — I2510 Atherosclerotic heart disease of native coronary artery without angina pectoris: Secondary | ICD-10-CM | POA: Diagnosis not present

## 2018-09-19 DIAGNOSIS — Z23 Encounter for immunization: Secondary | ICD-10-CM | POA: Diagnosis not present

## 2018-09-19 DIAGNOSIS — K219 Gastro-esophageal reflux disease without esophagitis: Secondary | ICD-10-CM | POA: Diagnosis not present

## 2018-09-19 DIAGNOSIS — E785 Hyperlipidemia, unspecified: Secondary | ICD-10-CM

## 2018-09-19 DIAGNOSIS — Z131 Encounter for screening for diabetes mellitus: Secondary | ICD-10-CM

## 2018-09-19 DIAGNOSIS — I10 Essential (primary) hypertension: Secondary | ICD-10-CM

## 2018-09-19 DIAGNOSIS — Z0000 Encounter for general adult medical examination without abnormal findings: Secondary | ICD-10-CM

## 2018-09-19 MED ORDER — RABEPRAZOLE SODIUM 20 MG PO TBEC: 20.0000 mg | DELAYED_RELEASE_TABLET | Freq: Every day | ORAL | 1 refills | Status: DC

## 2018-09-19 MED ORDER — ESCITALOPRAM OXALATE 10 MG PO TABS: 10.0000 mg | ORAL_TABLET | Freq: Every day | ORAL | 1 refills | Status: DC

## 2018-09-19 MED ORDER — FLUTICASONE FUROATE-VILANTEROL 100-25 MCG/INH IN AEPB: 1.0000 | INHALATION_SPRAY | Freq: Every day | RESPIRATORY_TRACT | 1 refills | Status: DC

## 2018-09-19 MED ORDER — CLOPIDOGREL BISULFATE 75 MG PO TABS: 75.0000 mg | ORAL_TABLET | Freq: Every day | ORAL | 1 refills | Status: DC

## 2018-09-19 NOTE — Progress Notes (Signed)
Name: Paul Page   MRN: [id number]    DOB: Apr 29, 1956   Date:09/19/2018       Progress Note  Subjective  Chief Complaint  Chief Complaint  Patient presents with  . Annual Exam  . Medication Refill    HPI  Patient presents for annual CPE and follow up  HTN: taking medication, denies chest pain or palpitation.   Hyperlipidemia: taking  Crestor,  Zetia, Vascepa. He has CAD but denies chest pain or decrease in exercise tolerance.  Denies chest pain or palpitation.   CAD: s/p stent placement, last one in 2011  under the care of Dr Kirke Corin, on aspirin , plavix and statin therapy. No pain at this time, normal exercise tolerance. We will recheck labs, on Zetia and Vascepa  Hyperglycemia: we will check hgbA1C , denies polyphagia, polydipsia or polyuria.   Anxiety: taking Lexapro, has to take it at night because it makes him feel tired, controlling symptoms, he would like a refill   Nodule finger: almost resolved, doing better  USPSTF grade A and B recommendations:  Diet: eats healthy, avoids eating out Exercise: he is physically active   Depression:  Depression screen [facility] 2/9 09/19/2018 04/17/2018 03/19/2018 11/16/2017 08/16/2017  Decreased Interest 0 0 0 0 0  Down, Depressed, Hopeless 0 0 0 0 0  PHQ - 2 Score 0 0 0 0 0  Altered sleeping 0 1 0 - -  Tired, decreased energy 0 1 0 - -  Change in appetite 0 0 0 - -  Feeling bad or failure about yourself  0 0 0 - -  Trouble concentrating 0 0 0 - -  Moving slowly or fidgety/restless 0 0 0 - -  Suicidal thoughts 0 0 0 - -  PHQ-9 Score 0 2 0 - -  Difficult doing work/chores Not difficult at all Not difficult at all Not difficult at all - -    Hypertension:  BP Readings from Last 3 Encounters:  09/19/18 122/70  04/17/18 124/72  03/19/18 120/70    Obesity: Wt Readings from Last 3 Encounters:  09/19/18 198 lb 8 oz (90 kg)  04/17/18 193 lb 8 oz (87.8 kg)  03/19/18 192 lb 4.8 oz (87.2 kg)   BMI Readings from Last 3  Encounters:  09/19/18 28.48 kg/m  04/17/18 27.76 kg/m  03/19/18 27.59 kg/m     Lipids:  Lab Results  Component Value Date   CHOL 174 03/14/2018   CHOL 248 (H) 08/16/2017   CHOL 206 (H) 09/05/2016   Lab Results  Component Value Date   HDL 61 03/14/2018   HDL 50 08/16/2017   HDL 46 09/05/2016   Lab Results  Component Value Date   LDLCALC 86 03/14/2018   LDLCALC  08/16/2017     Comment:     . LDL cholesterol not calculated. Triglyceride levels greater than 400 mg/dL invalidate calculated LDL results. . Reference range: <100 . Desirable range <100 mg/dL for primary prevention;   <70 mg/dL for patients with CHD or diabetic patients  with > or = 2 CHD risk factors. Marland Kitchen LDL-C is now calculated using the Martin-Hopkins  calculation, which is a validated novel method providing  better accuracy than the Friedewald equation in the  estimation of LDL-C.  Horald Pollen et al. Lenox Ahr. 4098;119(14): 2061-2068  (http://education.[facility].com/faq/FAQ164)    LDLCALC 81 09/05/2016   Lab Results  Component Value Date   TRIG 133 03/14/2018   TRIG 419 (H) 08/16/2017   TRIG 397 (H) 09/05/2016  Lab Results  Component Value Date   CHOLHDL 2.9 03/14/2018   CHOLHDL 5.0 (H) 08/16/2017   CHOLHDL 4.5 09/05/2016   No results found for: LDLDIRECT Glucose:  Glucose  Date Value Ref Range Status  09/05/2016 101 (H) 65 - 99 mg/dL Final    Comment:    Specimen received in contact with cells. No visible hemolysis present. However GLUC may be decreased and K increased. Clinical correlation indicated.   10/01/2015 98 65 - 99 mg/dL Final   Glucose, Bld  Date Value Ref Range Status  08/04/2016 89 65 - 99 mg/dL Final  47/82/956203/01/2016 130131 (H) 65 - 99 mg/dL Final      [facility]  AUDIT-C Score  0       Married STD testing and prevention (HIV/chl/gon/syphilis): N/A and not interested  Hep C: up to date  Skin cancer: discussed  atypical lesions Colorectal cancer: up to date Prostate cancer: discussed USPTF and he wants to continue PSA for now  Lab Results  Component Value Date   PSA 0.8 05/16/2017    IPSS Questionnaire (AUA-7): Over the past month.   1)  How often have you had a sensation of not emptying your bladder completely after you finish urinating?  0 - Not at all  2)  How often have you had to urinate again less than two hours after you finished urinating? 2 - Less than half the time  3)  How often have you found you stopped and started again several times when you urinated?  0 - Not at all  4) How difficult have you found it to postpone urination?  0 - Not at all  5) How often have you had a weak urinary stream?  0 - Not at all  6) How often have you had to push or strain to begin urination?  0 - Not at all  7) How many times did you most typically get up to urinate from the time you went to bed until the time you got up in the morning?  1 - 1 time  Total score:  0-7 mildly symptomatic   8-19 moderately symptomatic   20-35 severely symptomatic    Lung cancer:   Low Dose CT Chest recommended if Age 56-80 years, 30 pack-year currently smoking OR have quit w/in 15years. Patient does not qualify.   AAA: The USPSTF recommends one-time screening with ultrasonography in men ages 1065 to 2575 years who have ever smoked ECG:  Sees Dr. Kirke CorinArida Advanced Care Planning: A voluntary discussion about advance care planning including the explanation and discussion of advance directives.  Discussed health care proxy and Living will, and the patient was able to identify a health care proxy as wife .  Patient does not have a living will at present time.   Patient Active Problem List   Diagnosis Date Noted  . Hypertension 04/12/2015  . Dyslipidemia 03/22/2015  . GERD (gastroesophageal reflux disease) 03/22/2015  . Asthma 03/22/2015  . Anxiety 03/22/2015  . Family history of ischemic heart disease (IHD) 10/24/2012  .  History of fracture of rib 09/04/2011  . Traumatic pneumothorax 09/04/2011  . Coronary artery disease involving native coronary artery of native heart without angina pectoris 08/01/2007    Past Surgical History:  Procedure Laterality Date  . ANKLE FRACTURE SURGERY    . CARDIAC CATHETERIZATION  2011  . CARDIAC CATHETERIZATION  2008  . CORONARY STENT PLACEMENT  2008 & 2011  Family History  Problem Relation Age of Onset  . Parkinson's disease Mother   . Cancer Father     Social History   Socioeconomic History  . Marital status: Married    Spouse name: Darel HongJudy  . Number of children: 2  . Years of education: 4512  . Highest education level: Some college, no degree  Occupational History  . Occupation: self employed  Social Needs  . Financial resource strain: Not hard at all  . Food insecurity:    Worry: Never true    Inability: Never true  . Transportation needs:    Medical: No    Non-medical: No  Tobacco Use  . Smoking status: Never Smoker  . Smokeless tobacco: Never Used  Substance and Sexual Activity  . Alcohol use: Yes    Alcohol/week: 0.0 standard drinks    Comment: occasional wine  . Drug use: No  . Sexual activity: Yes  Lifestyle  . Physical activity:    Days per week: 3 days    Minutes per session: 80 min  . Stress: Not at all  Relationships  . Social connections:    Talks on phone: Twice a week    Gets together: Twice a week    Attends religious service: More than 4 times per year    Active member of club or organization: Yes    Attends meetings of clubs or organizations: More than 4 times per year    Relationship status: Married  . Intimate partner violence:    Fear of current or ex partner: No    Emotionally abused: No    Physically abused: No    Forced sexual activity: No  Other Topics Concern  . Not on file  Social History Narrative  . Not on file     Current Outpatient Medications:  .  albuterol (PROAIR HFA) 108 (90 Base) MCG/ACT inhaler,  INHALE 2 PUFFS INTO THE LUNGS EVERY 6 (SIX) HOURS AS NEEDED FOR WHEEZING OR SHORTNESS OF BREATH., Disp: 1 Inhaler, Rfl: 1 .  albuterol (PROVENTIL) (2.5 MG/3ML) 0.083% nebulizer solution, Take 3 mLs (2.5 mg total) by nebulization every 6 (six) hours as needed for wheezing or shortness of breath., Disp: 150 mL, Rfl: 1 .  clopidogrel (PLAVIX) 75 MG tablet, Take 1 tablet (75 mg total) by mouth daily., Disp: 90 tablet, Rfl: 1 .  escitalopram (LEXAPRO) 10 MG tablet, Take 1 tablet (10 mg total) by mouth daily., Disp: 90 tablet, Rfl: 1 .  fluticasone furoate-vilanterol (BREO ELLIPTA) 100-25 MCG/INH AEPB, Inhale 1 puff into the lungs daily., Disp: 270 each, Rfl: 1 .  Icosapent Ethyl (VASCEPA) 1 g CAPS, Take 2 tablets by mouth 2 (two) times daily with a meal., Disp: 360 capsule, Rfl: 3 .  losartan (COZAAR) 50 MG tablet, TAKE 1 TABLET BY MOUTH EVERY DAY, Disp: 90 tablet, Rfl: 1 .  Multiple Vitamins-Minerals (CENTRUM SILVER PO), Take 1 tablet by mouth daily., Disp: , Rfl:  .  nebivolol (BYSTOLIC) 5 MG tablet, Take 1 tablet (5 mg total) by mouth daily., Disp: 90 tablet, Rfl: 3 .  RABEprazole (ACIPHEX) 20 MG tablet, Take 1 tablet (20 mg total) by mouth daily., Disp: 90 tablet, Rfl: 1 .  ezetimibe (ZETIA) 10 MG tablet, Take 1 tablet (10 mg total) by mouth daily., Disp: 90 tablet, Rfl: 3 .  rosuvastatin (CRESTOR) 40 MG tablet, TAKE 1 TABLET BY MOUTH EVERY DAY (Patient not taking: Reported on 09/19/2018), Disp: 90 tablet, Rfl: 3  Allergies  Allergen Reactions  . Molds &  Smuts Shortness Of Breath  . Fish Oil Hives  . Peanut Oil   . Tetracyclines & Related Other (See Comments)    Intolerance - stomach upset     ROS  Constitutional: Negative for fever or significant  weight change.  Respiratory: Negative for cough and shortness of breath.   Cardiovascular: Negative for chest pain or palpitations.  Gastrointestinal: Negative for abdominal pain, no bowel changes.  Musculoskeletal: Negative for gait problem or  joint swelling.  Skin: Negative for rash.  Neurological: Negative for dizziness or headache.  No other specific complaints in a complete review of systems (except as listed in HPI above).   Objective  Vitals:   09/19/18 0941  BP: 122/70  Pulse: 79  Resp: 14  Temp: 97.9 F (36.6 C)  TempSrc: Oral  SpO2: 94%  Weight: 198 lb 8 oz (90 kg)  Height: 5\' 10"  (1.778 m)    Body mass index is 28.48 kg/m.  Physical Exam  Constitutional: Patient appears well-developed and well-nourished. No distress.  HENT: Head: Normocephalic and atraumatic. Ears: B TMs ok, no erythema or effusion; Nose: Nose normal. Mouth/Throat: Oropharynx is clear and moist. No oropharyngeal exudate.  Eyes: Conjunctivae and EOM are normal. Pupils are equal, round, and reactive to light. No scleral icterus.  Neck: Normal range of motion. Neck supple. No JVD present. No thyromegaly present.  Cardiovascular: Normal rate, regular rhythm and normal heart sounds.  No murmur heard. No BLE edema. Pulmonary/Chest: Effort normal and breath sounds normal. No respiratory distress. Abdominal: Soft. Bowel sounds are normal, no distension. There is no tenderness. no masses MALE GENITALIA: Normal descended testes bilaterally, no masses palpated, no hernias, no lesions, no discharge RECTAL: not done Musculoskeletal: Normal range of motion, no joint effusions. No gross deformities Neurological: he is alert and oriented to person, place, and time. No cranial nerve deficit. Coordination, balance, strength, speech and gait are normal.  Skin: Skin is warm and dry. No rash noted. No erythema.  Psychiatric: Patient has a normal mood and affect. behavior is normal. Judgment and thought content normal.  PHQ2/9: Depression screen [facility] 2/9 09/19/2018 04/17/2018 03/19/2018 11/16/2017 08/16/2017  Decreased Interest 0 0 0 0 0  Down, Depressed, Hopeless 0 0 0 0 0  PHQ - 2 Score 0 0 0 0 0  Altered sleeping 0 1 0 - -  Tired, decreased energy 0 1 0 -  -  Change in appetite 0 0 0 - -  Feeling bad or failure about yourself  0 0 0 - -  Trouble concentrating 0 0 0 - -  Moving slowly or fidgety/restless 0 0 0 - -  Suicidal thoughts 0 0 0 - -  PHQ-9 Score 0 2 0 - -  Difficult doing work/chores Not difficult at all Not difficult at all Not difficult at all - -     Fall Risk: Fall Risk  09/19/2018 04/17/2018 03/19/2018 11/16/2017 08/16/2017  Falls in the past year? 0 No No Yes Yes  Number falls in past yr: 0 - - 1 1  Injury with Fall? 0 - - Yes Yes  Comment - - - - -  Risk for fall due to : - - - - -  Follow up - - - - Falls prevention discussed     Functional Status Survey: Is the patient deaf or have difficulty hearing?: No Does the patient have difficulty seeing, even when wearing glasses/contacts?: No Does the patient have difficulty concentrating, remembering, or making decisions?: No Does the patient  have difficulty walking or climbing stairs?: No Does the patient have difficulty dressing or bathing?: No Does the patient have difficulty doing errands alone such as visiting a doctor's office or shopping?: No   Assessment & Plan  1. Gastroesophageal reflux disease, esophagitis presence not specified  - RABEprazole (ACIPHEX) 20 MG tablet; Take 1 tablet (20 mg total) by mouth daily.  Dispense: 90 tablet; Refill: 1  2. Anxiety  - escitalopram (LEXAPRO) 10 MG tablet; Take 1 tablet (10 mg total) by mouth daily.  Dispense: 90 tablet; Refill: 1  3. Coronary artery disease involving native coronary artery of native heart without angina pectoris  - clopidogrel (PLAVIX) 75 MG tablet; Take 1 tablet (75 mg total) by mouth daily.  Dispense: 90 tablet; Refill: 1  4. Moderate persistent asthma without complication  - fluticasone furoate-vilanterol (BREO ELLIPTA) 100-25 MCG/INH AEPB; Inhale 1 puff into the lungs daily.  Dispense: 270 each; Refill: 1  5. Essential hypertension  - COMPLETE METABOLIC PANEL WITH GFR  6. Dyslipidemia  -  Lipid panel  7. Diabetes mellitus screening  - Hemoglobin A1c  8. Vitamin D deficiency  - VITAMIN D 25 Hydroxy (Vit-D Deficiency, Fractures)  9. Prostate cancer screening  - PSA  10. Annual physical exam   11. Need for shingles vaccine  - Varicella-zoster vaccine IM  -Prostate cancer screening and PSA options (with potential risks and benefits of testing vs not testing) were discussed along with recent recs/guidelines. -USPSTF grade A and B recommendations reviewed with patient; age-appropriate recommendations, preventive care, screening tests, etc discussed and encouraged; healthy living encouraged; see AVS for patient education given to patient -Discussed importance of 150 minutes of physical activity weekly, eat two servings of fish weekly, eat one serving of tree nuts ( cashews, pistachios, pecans, almonds.Marland Kitchen) every other day, eat 6 servings of fruit/vegetables daily and drink plenty of water and avoid sweet beverages.

## 2018-09-19 NOTE — Patient Instructions (Signed)
Preventive Care 40-64 Years, Male Preventive care refers to lifestyle choices and visits with your health care provider that can promote health and wellness. What does preventive care include?   A yearly physical exam. This is also called an annual well check.  Dental exams once or twice a year.  Routine eye exams. Ask your health care provider how often you should have your eyes checked.  Personal lifestyle choices, including: ? Daily care of your teeth and gums. ? Regular physical activity. ? Eating a healthy diet. ? Avoiding tobacco and drug use. ? Limiting alcohol use. ? Practicing safe sex. ? Taking low-dose aspirin every day starting at age 50. What happens during an annual well check? The services and screenings done by your health care provider during your annual well check will depend on your age, overall health, lifestyle risk factors, and family history of disease. Counseling Your health care provider may ask you questions about your:  Alcohol use.  Tobacco use.  Drug use.  Emotional well-being.  Home and relationship well-being.  Sexual activity.  Eating habits.  Work and work environment. Screening You may have the following tests or measurements:  Height, weight, and BMI.  Blood pressure.  Lipid and cholesterol levels. These may be checked every 5 years, or more frequently if you are over 50 years old.  Skin check.  Lung cancer screening. You may have this screening every year starting at age 55 if you have a 30-pack-year history of smoking and currently smoke or have quit within the past 15 years.  Colorectal cancer screening. All adults should have this screening starting at age 50 and continuing until age 75. Your health care provider may recommend screening at age 45. You will have tests every 1-10 years, depending on your results and the type of screening test. People at increased risk should start screening at an earlier age. Screening tests may  include: ? Guaiac-based fecal occult blood testing. ? Fecal immunochemical test (FIT). ? Stool DNA test. ? Virtual colonoscopy. ? Sigmoidoscopy. During this test, a flexible tube with a tiny camera (sigmoidoscope) is used to examine your rectum and lower colon. The sigmoidoscope is inserted through your anus into your rectum and lower colon. ? Colonoscopy. During this test, a long, thin, flexible tube with a tiny camera (colonoscope) is used to examine your entire colon and rectum.  Prostate cancer screening. Recommendations will vary depending on your family history and other risks.  Hepatitis C blood test.  Hepatitis B blood test.  Sexually transmitted disease (STD) testing.  Diabetes screening. This is done by checking your blood sugar (glucose) after you have not eaten for a while (fasting). You may have this done every 1-3 years. Discuss your test results, treatment options, and if necessary, the need for more tests with your health care provider. Vaccines Your health care provider may recommend certain vaccines, such as:  Influenza vaccine. This is recommended every year.  Tetanus, diphtheria, and acellular pertussis (Tdap, Td) vaccine. You may need a Td booster every 10 years.  Varicella vaccine. You may need this if you have not been vaccinated.  Zoster vaccine. You may need this after age 60.  Measles, mumps, and rubella (MMR) vaccine. You may need at least one dose of MMR if you were born in 1957 or later. You may also need a second dose.  Pneumococcal 13-valent conjugate (PCV13) vaccine. You may need this if you have certain conditions and have not been vaccinated.  Pneumococcal polysaccharide (PPSV23) vaccine.   You may need one or two doses if you smoke cigarettes or if you have certain conditions.  Meningococcal vaccine. You may need this if you have certain conditions.  Hepatitis A vaccine. You may need this if you have certain conditions or if you travel or work in  places where you may be exposed to hepatitis A.  Hepatitis B vaccine. You may need this if you have certain conditions or if you travel or work in places where you may be exposed to hepatitis B.  Haemophilus influenzae type b (Hib) vaccine. You may need this if you have certain risk factors. Talk to your health care provider about which screenings and vaccines you need and how often you need them. This information is not intended to replace advice given to you by your health care provider. Make sure you discuss any questions you have with your health care provider. Document Released: 10/15/2015 Document Revised: 11/08/2017 Document Reviewed: 07/20/2015 Elsevier Interactive Patient Education  2019 Elsevier Inc.  

## 2018-09-20 ENCOUNTER — Encounter: Payer: Self-pay | Admitting: Family Medicine

## 2018-09-20 DIAGNOSIS — R7303 Prediabetes: Secondary | ICD-10-CM | POA: Insufficient documentation

## 2018-09-20 LAB — HEMOGLOBIN A1C
Hgb A1c MFr Bld: 5.8 % of total Hgb — ABNORMAL HIGH (ref <5.7)
MEAN PLASMA GLUCOSE: 120 (calc)
eAG (mmol/L): 6.6 (calc)

## 2018-09-20 LAB — LIPID PANEL
Cholesterol: 141 mg/dL (ref <200)
HDL: 63 mg/dL (ref >40)
LDL Cholesterol (Calc): 57 mg/dL (calc)
Non-HDL Cholesterol (Calc): 78 mg/dL (calc) (ref <130)
Total CHOL/HDL Ratio: 2.2 (calc) (ref <5.0)
Triglycerides: 133 mg/dL (ref <150)

## 2018-09-20 LAB — COMPLETE METABOLIC PANEL WITH GFR
AG Ratio: 1.9 (calc) (ref 1.0–2.5)
ALKALINE PHOSPHATASE (APISO): 66 U/L (ref 40–115)
ALT: 30 U/L (ref 9–46)
AST: 29 U/L (ref 10–35)
Albumin: 4.8 g/dL (ref 3.6–5.1)
BUN: 21 mg/dL (ref 7–25)
CO2: 31 mmol/L (ref 20–32)
CREATININE: 1.14 mg/dL (ref 0.70–1.25)
Calcium: 10 mg/dL (ref 8.6–10.3)
Chloride: 103 mmol/L (ref 98–110)
GFR, Est African American: 79 mL/min/{1.73_m2} (ref >=60)
GFR, Est Non African American: 69 mL/min/{1.73_m2} (ref >=60)
Globulin: 2.5 g/dL (calc) (ref 1.9–3.7)
Glucose, Bld: 104 mg/dL — ABNORMAL HIGH (ref 65–99)
Potassium: 4.5 mmol/L (ref 3.5–5.3)
Sodium: 142 mmol/L (ref 135–146)
Total Bilirubin: 0.6 mg/dL (ref 0.2–1.2)
Total Protein: 7.3 g/dL (ref 6.1–8.1)

## 2018-09-20 LAB — VITAMIN D 25 HYDROXY (VIT D DEFICIENCY, FRACTURES): Vit D, 25-Hydroxy: 48 ng/mL (ref 30–100)

## 2018-09-20 LAB — PSA: PSA: 1 ng/mL (ref <=4.0)

## 2018-10-16 ENCOUNTER — Other Ambulatory Visit: Payer: Self-pay | Admitting: Family Medicine

## 2018-10-16 DIAGNOSIS — J4541 Moderate persistent asthma with (acute) exacerbation: Secondary | ICD-10-CM

## 2018-10-28 ENCOUNTER — Other Ambulatory Visit: Payer: Self-pay | Admitting: Cardiovascular Disease

## 2018-11-12 ENCOUNTER — Other Ambulatory Visit: Payer: Self-pay

## 2018-11-13 NOTE — Telephone Encounter (Signed)
Patient was last seen January 2019 and was supposed to follow up in 6 months. Patient needs to schedule an appointment. No answer. Left message to call back.

## 2018-11-13 NOTE — Telephone Encounter (Signed)
Needs an appointment for refills.

## 2018-11-22 NOTE — Telephone Encounter (Signed)
Call dropped will reach out again another time

## 2018-11-25 ENCOUNTER — Other Ambulatory Visit: Payer: Self-pay | Admitting: Cardiovascular Disease

## 2018-12-02 NOTE — Telephone Encounter (Signed)
Scheduled

## 2018-12-03 MED ORDER — NEBIVOLOL HCL 5 MG PO TABS: 5.0000 mg | ORAL_TABLET | Freq: Every day | ORAL | 0 refills | Status: DC

## 2018-12-04 ENCOUNTER — Other Ambulatory Visit: Payer: Self-pay

## 2018-12-04 MED ORDER — EZETIMIBE 10 MG PO TABS: 10.0000 mg | ORAL_TABLET | Freq: Every day | ORAL | 0 refills | Status: DC

## 2018-12-04 MED ORDER — ICOSAPENT ETHYL 1 G PO CAPS: 2.0000 g | ORAL_CAPSULE | Freq: Two times a day (BID) | ORAL | 0 refills | Status: DC

## 2018-12-04 NOTE — Telephone Encounter (Signed)
A one month refill have been sent into [facility] for Zetia 10 mg daily. The patient will need to keep his follow up appointment on 12/23/2018

## 2018-12-11 ENCOUNTER — Other Ambulatory Visit: Payer: Self-pay | Admitting: Cardiovascular Disease

## 2018-12-11 NOTE — Telephone Encounter (Signed)
Tried to contact patient but the patient's cell phone would just ring and ring and not able to leave a voice mail for what we need. The patient will need to make a follow up appointment for [place] to continue his refill on medications. The patient was to let us know if he needs to continue his care elsewhere due to his change in medical insurance.

## 2018-12-18 ENCOUNTER — Telehealth: Payer: Self-pay

## 2018-12-18 MED ORDER — EZETIMIBE 10 MG PO TABS: 10.0000 mg | ORAL_TABLET | Freq: Every day | ORAL | 3 refills | Status: DC

## 2018-12-18 MED ORDER — ICOSAPENT ETHYL 1 G PO CAPS: 2.0000 g | ORAL_CAPSULE | Freq: Two times a day (BID) | ORAL | 3 refills | Status: DC

## 2018-12-18 MED ORDER — LOSARTAN POTASSIUM 50 MG PO TABS: 50.0000 mg | ORAL_TABLET | Freq: Every day | ORAL | 1 refills | Status: DC

## 2018-12-18 NOTE — Telephone Encounter (Signed)
Call to patient to reschedule in regards to COVID-19 restrictions.  ?  Pt is comfortable with rescheduling at this time and denies any new, urgent or worsening sx.    COVID-19 Pre-Screening:  1. Have you been in contact with someone who was sick? no 2. Do you have any of the following symptoms (cough, fever, muscle pain, vomiting, diarrhea, weakness abdominal pain, rash, red eye, bruising or bleeding, joint pain, severe headache)?  no 3. Have you travelled internationally or out of state in the last month?  no   4. Do you need any refills at this time?  Yes   Patient aware of the following: Please be advised that we require, no one but yourself to come to appointment. If necessary, only one visitor may come with you into the building. They will also be asked the same screening questions. You will be contacted at a later time to reschedule. However, this will depend on ongoing evaluation of the Covid-19 situation.  Please call us if any new questions or concerns arise. We are here for advice.  Routing to COVID cancel pool.

## 2018-12-23 ENCOUNTER — Ambulatory Visit: Payer: BLUE CROSS/BLUE SHIELD | Admitting: Nurse Practitioner

## 2018-12-26 ENCOUNTER — Encounter: Payer: Self-pay | Admitting: Family Medicine

## 2019-01-02 NOTE — Telephone Encounter (Signed)
Spoke with patient. He is interested in setting up a virtual visit via webcam using his computer. Will send to scheduling pool and remove from COVID-19 cancellation pool.

## 2019-01-02 NOTE — Telephone Encounter (Signed)
LMOV to schedule  

## 2019-02-27 ENCOUNTER — Telehealth: Payer: Self-pay

## 2019-02-27 NOTE — Telephone Encounter (Signed)
Virtual Visit Pre-Appointment Phone Call  "Paul Page, I am calling you today to discuss your upcoming appointment. We are currently trying to limit exposure to the virus that causes COVID-19 by seeing patients at home rather than in the office."  1. "What is the BEST phone number to call the day of the visit?" - include this in appointment notes  2. "Do you have or have access to (through a family member/friend) a smartphone with video capability that we can use for your visit?" a. If yes - list this number in appt notes as "cell" (if different from BEST phone #) and list the appointment type as a VIDEO visit in appointment notes b. If no - list the appointment type as a PHONE visit in appointment notes  3. Confirm consent - "In the setting of the current Covid19 crisis, you are scheduled for a video visit with your provider on Feb 28, 2019 at 8:40AM.  Just as we do with many in-office visits, in order for you to participate in this visit, we must obtain consent.  If you'd like, I can send this to your mychart (if signed up) or email for you to review.  Otherwise, I can obtain your verbal consent now.  All virtual visits are billed to your insurance company just like a normal visit would be.  By agreeing to a virtual visit, we'd like you to understand that the technology does not allow for your provider to perform an examination, and thus may limit your provider's ability to fully assess your condition. If your provider identifies any concerns that need to be evaluated in person, we will make arrangements to do so.  Finally, though the technology is pretty good, we cannot assure that it will always work on either your or our end, and in the setting of a video visit, we may have to convert it to a phone-only visit.  In either situation, we cannot ensure that we have a secure connection.  Are you willing to proceed?" STAFF: Did the patient verbally acknowledge consent to telehealth visit? Document YES/NO  here: YES  4. Advise patient to be prepared - "Two hours prior to your appointment, go ahead and check your blood pressure, pulse, oxygen saturation, and your weight (if you have the equipment to check those) and write them all down. When your visit starts, your provider will ask you for this information. If you have an Apple Watch or Kardia device, please plan to have heart rate information ready on the day of your appointment. Please have a pen and paper handy nearby the day of the visit as well."  5. Give patient instructions for MyChart download to smartphone OR Doximity/Doxy.me as below if video visit (depending on what platform provider is using)  6. Inform patient they will receive a phone call 15 minutes prior to their appointment time (may be from unknown caller ID) so they should be prepared to answer    TELEPHONE CALL NOTE  Paul Page has been deemed a candidate for a follow-up tele-health visit to limit community exposure during the Covid-19 pandemic. I spoke with the patient via phone to ensure availability of phone/video source, confirm preferred email & phone number, and discuss instructions and expectations.  I reminded Paul Page Massi to be prepared with any vital sign and/or heart rhythm information that could potentially be obtained via home monitoring, at the time of his visit. I reminded Paul Page to expect a phone call prior to his visit.  Tommie Sams McClain 02/27/2019 8:52 AM   INSTRUCTIONS FOR DOWNLOADING THE MYCHART APP TO SMARTPHONE  - The patient must first make sure to have activated MyChart and know their login information - If Apple, go to [employer] and type in MyChart in the search bar and download the app. If Android, ask patient to go to [employer] and type in [place] in the search bar and download the app. The app is free but as with any other app downloads, their phone may require them to verify saved payment information or  Apple/Android password.  - The patient will need to then log into the app with their MyChart username and password, and select [facility] as their healthcare provider to link the account. When it is time for your visit, go to the MyChart app, find appointments, and click Begin Video Visit. Be sure to Select Allow for your device to access the Microphone and Camera for your visit. You will then be connected, and your provider will be with you shortly.  **If they have any issues connecting, or need assistance please contact MyChart service desk [phone])**  **If using a computer, in order to ensure the best quality for their visit they will need to use either of the following Internet Browsers: [employer] or Google Chrome**  IF USING DOXIMITY or DOXY.ME - The patient will receive a link just prior to their visit by text.     FULL LENGTH CONSENT FOR TELE-HEALTH VISIT   I hereby voluntarily request, consent and authorize [facility] and its employed or contracted physicians, physician assistants, nurse practitioners or other licensed health care professionals (the Practitioner), to provide me with telemedicine health care services (the "Services") as deemed necessary by the treating Practitioner. I acknowledge and consent to receive the Services by the Practitioner via telemedicine. I understand that the telemedicine visit will involve communicating with the Practitioner through live audiovisual communication technology and the disclosure of certain medical information by electronic transmission. I acknowledge that I have been given the opportunity to request an in-person assessment or other available alternative prior to the telemedicine visit and am voluntarily participating in the telemedicine visit.  I understand that I have the right to withhold or withdraw my consent to the use of telemedicine in the course of my care at any time, without affecting my right to future care  or treatment, and that the Practitioner or I may terminate the telemedicine visit at any time. I understand that I have the right to inspect all information obtained and/or recorded in the course of the telemedicine visit and may receive copies of available information for a reasonable fee.  I understand that some of the potential risks of receiving the Services via telemedicine include:  Marland Kitchen Delay or interruption in medical evaluation due to technological equipment failure or disruption; . Information transmitted may not be sufficient (e.g. poor resolution of images) to allow for appropriate medical decision making by the Practitioner; and/or  . In rare instances, security protocols could fail, causing a breach of personal health information.  Furthermore, I acknowledge that it is my responsibility to provide information about my medical history, conditions and care that is complete and accurate to the best of my ability. I acknowledge that Practitioner's advice, recommendations, and/or decision may be based on factors not within their control, such as incomplete or inaccurate data provided by me or distortions of diagnostic images or specimens that may result from electronic transmissions. I understand that  the practice of medicine is not an exact science and that Practitioner makes no warranties or guarantees regarding treatment outcomes. I acknowledge that I will receive a copy of this consent concurrently upon execution via email to the email address I last provided but may also request a printed copy by calling the office of [place].    I understand that my insurance will be billed for this visit.   I have read or had this consent read to me. . I understand the contents of this consent, which adequately explains the benefits and risks of the Services being provided via telemedicine.  . I have been provided ample opportunity to ask questions regarding this consent and the Services and have had  my questions answered to my satisfaction. . I give my informed consent for the services to be provided through the use of telemedicine in my medical care  By participating in this telemedicine visit I agree to the above.

## 2019-02-28 ENCOUNTER — Telehealth (INDEPENDENT_AMBULATORY_CARE_PROVIDER_SITE_OTHER): Payer: BLUE CROSS/BLUE SHIELD | Admitting: Cardiovascular Disease

## 2019-02-28 ENCOUNTER — Encounter: Payer: Self-pay | Admitting: Cardiovascular Disease

## 2019-02-28 ENCOUNTER — Other Ambulatory Visit: Payer: Self-pay

## 2019-02-28 VITALS — BP 131/82 | HR 71 | Ht 70.0 in | Wt 202.0 lb

## 2019-02-28 DIAGNOSIS — E782 Mixed hyperlipidemia: Secondary | ICD-10-CM

## 2019-02-28 DIAGNOSIS — I2510 Atherosclerotic heart disease of native coronary artery without angina pectoris: Secondary | ICD-10-CM | POA: Diagnosis not present

## 2019-02-28 MED ORDER — NEBIVOLOL HCL 5 MG PO TABS: 5.0000 mg | ORAL_TABLET | Freq: Every day | ORAL | 3 refills | Status: DC

## 2019-02-28 MED ORDER — EZETIMIBE 10 MG PO TABS: 10.0000 mg | ORAL_TABLET | Freq: Every day | ORAL | 3 refills | Status: DC

## 2019-02-28 NOTE — Patient Instructions (Signed)
Medication Instructions:  Continue same medications If you need a refill on your cardiac medications before your next appointment, please call your pharmacy.   Lab work: None If you have labs (blood work) drawn today and your tests are completely normal, you will receive your results only by: . MyChart Message (if you have MyChart) OR . A paper copy in the mail If you have any lab test that is abnormal or we need to change your treatment, we will call you to review the results.  Testing/Procedures: None  Follow-Up: At [facility], you and your health needs are our priority.  As part of our continuing mission to provide you with exceptional heart care, we have created designated Provider Care Teams.  These Care Teams include your primary Cardiologist (physician) and Advanced Practice Providers (APPs -  Physician Assistants and Nurse Practitioners) who all work together to provide you with the care you need, when you need it. You will need a follow up appointment in 6 months.  Please call our office 2 months in advance to schedule this appointment.  You may see Muhammad Arida, MD or one of the following Advanced Practice Providers on your designated Care Team:   Christopher Berge, NP Ryan Dunn, PA-C . Jacquelyn Visser, PA-C   

## 2019-02-28 NOTE — Progress Notes (Signed)
Virtual Visit via Video Note   This visit type was conducted due to national recommendations for restrictions regarding the COVID-19 Pandemic (e.g. social distancing) in an effort to limit this patient's exposure and mitigate transmission in our community.  Due to his co-morbid illnesses, this patient is at least at moderate risk for complications without adequate follow up.  This format is felt to be most appropriate for this patient at this time.  All issues noted in this document were discussed and addressed.  A limited physical exam was performed with this format.  Please refer to the patient's chart for his consent to telehealth for Paul Page.   Date:  02/28/2019   ID:  Ayesha Mohairichard Martinek, DOB [birth year], MRN [id number]  Patient Location: Home Provider Location: Office  PCP:  Alba CorySowles, Krichna, MD  Cardiologist:  Lorine BearsMuhammad Ghada Abbett, MD  Electrophysiologist:  None   Evaluation Performed:  Follow-Up Visit  Chief Complaint:    History of Present Illness:    [name] is a 63 y.o. male was seen via video visit for follow-up regarding coronary artery disease. He presented in 2008 with unstable angina. Cardiac catheterization showed severe three-vessel coronary artery disease with normal ejection fraction. He underwent successful angioplasty and three-vessel drug-eluting stent placement. He returned in 2011 with unstable angina. Repeat cardiac catheterization showed new disease in the LAD and RCA. Both of them were treated with drug-eluting stents.  In total, he has 5 stents.  No cardiac events since then. He has chronic medical conditions that include hypertension, hyperlipidemia and palpitations controlled with a small dose beta blocker. He has strong family history of coronary artery disease and hyperlipidemia. He is not a smoker. He had a treadmill stress test in July, 2017 which was negative for ischemia but he had hypertensive response to exercise.   He has been doing very well  overall with no chest pain, shortness of breath or palpitations.   We added Vascepa last year and increased rosuvastatin to 40 mg daily.  Since then, his lipid profile improved significantly.  He reports no side effects.  The patient does not have symptoms concerning for COVID-19 infection (fever, chills, cough, or new shortness of breath).    Past Medical History:  Diagnosis Date  . Anxiety   . Asthma   . Coronary artery disease    Three-vessel coronary artery disease diagnosed in October 2008 with normal ejection fraction. PCI and drug-eluting stent placement to the LAD(3.0 x 8 mm Promus), left circumflex(2.5 x 12 mm Promus) and right coronary artery(3.5 x 12 mm Promus). Unstable angina in 2011. PCI of RCA with a 3.5 x 15 mm Promus and PCI of LAD with a 2.5 x 28 mm Promus drug-eluting stents   . GERD (gastroesophageal reflux disease)   . Heart disease   . Hyperlipidemia   . Hypertension    Past Surgical History:  Procedure Laterality Date  . ANKLE FRACTURE SURGERY    . CARDIAC CATHETERIZATION  2011  . CARDIAC CATHETERIZATION  2008  . CORONARY STENT PLACEMENT  2008 & 2011     Current Meds  Medication Sig  . albuterol (PROAIR HFA) 108 (90 Base) MCG/ACT inhaler TAKE 2 PUFFS BY MOUTH EVERY 6 HOURS AS NEEDED FOR WHEEZE OR SHORTNESS OF BREATH  . albuterol (PROVENTIL) (2.5 MG/3ML) 0.083% nebulizer solution Take 3 mLs (2.5 mg total) by nebulization every 6 (six) hours as needed for wheezing or shortness of breath.  . clopidogrel (PLAVIX) 75 MG tablet Take 1 tablet (75 mg total)  by mouth daily.  Marland Kitchen escitalopram (LEXAPRO) 10 MG tablet Take 1 tablet (10 mg total) by mouth daily.  Marland Kitchen ezetimibe (ZETIA) 10 MG tablet Take 1 tablet (10 mg total) by mouth daily.  . fluticasone furoate-vilanterol (BREO ELLIPTA) 100-25 MCG/INH AEPB Inhale 1 puff into the lungs daily.  Bess Harvest Ethyl (VASCEPA) 1 g CAPS Take 2 capsules (2 g total) by mouth 2 (two) times daily with a meal.  . losartan (COZAAR) 50 MG  tablet Take 1 tablet (50 mg total) by mouth daily.  . Multiple Vitamins-Minerals (CENTRUM SILVER PO) Take 1 tablet by mouth daily.  . nebivolol (BYSTOLIC) 5 MG tablet Take 1 tablet (5 mg total) by mouth daily.  . RABEprazole (ACIPHEX) 20 MG tablet Take 1 tablet (20 mg total) by mouth daily.  . rosuvastatin (CRESTOR) 40 MG tablet TAKE 1 TABLET BY MOUTH EVERY DAY     Allergies:   Molds & smuts; Fish oil; Peanut oil; and Tetracyclines & related   Social History   Tobacco Use  . Smoking status: Never Smoker  . Smokeless tobacco: Never Used  Substance Use Topics  . Alcohol use: Yes    Alcohol/week: 0.0 standard drinks    Comment: occasional wine  . Drug use: No     Family Hx: The patient's family history includes Cancer in his father; Parkinson's disease in his mother.  ROS:   Please see the history of present illness.     All other systems reviewed and are negative.   Prior CV studies:   The following studies were reviewed today:    Labs/Other Tests and Data Reviewed:    EKG:  No ECG reviewed.  Recent Labs: 09/19/2018: ALT 30; BUN 21; Creat 1.14; Potassium 4.5; Sodium 142   Recent Lipid Panel Lab Results  Component Value Date/Time   CHOL 141 09/19/2018 10:57 AM   CHOL 206 (H) 09/05/2016 08:48 AM   TRIG 133 09/19/2018 10:57 AM   HDL 63 09/19/2018 10:57 AM   HDL 46 09/05/2016 08:48 AM   CHOLHDL 2.2 09/19/2018 10:57 AM   LDLCALC 57 09/19/2018 10:57 AM    Wt Readings from Last 3 Encounters:  02/28/19 202 lb (91.6 kg)  09/19/18 198 lb 8 oz (90 kg)  04/17/18 193 lb 8 oz (87.8 kg)     Objective:    Vital Signs:  BP 131/82   Pulse 71   Ht  (1.778 m)   Wt 202 lb (91.6 kg)   BMI 28.98 kg/m    VITAL SIGNS:  reviewed GEN:  no acute distress EYES:  sclerae anicteric, EOMI - Extraocular Movements Intact RESPIRATORY:  normal respiratory effort, symmetric expansion SKIN:  no rash, lesions or ulcers. MUSCULOSKELETAL:  no obvious deformities. NEURO:  alert  and oriented x 3, no obvious focal deficit PSYCH:  normal affect  ASSESSMENT & PLAN:    1. Coronary artery disease involving native coronary arteries: He is doing well with no anginal symptoms.  Continue lifelong dual antiplatelet therapy.  2. Hyperlipidemia: Continue rosuvastatin 40 mg daily, Zetia and Vascepa.  I reviewed most recent lipid profile done in December 2019 which showed significant improvement in all his numbers.  Triglyceride was down to 133 and LDL was 57.  Both at target.  3. Essential hypertension: Blood pressure is reasonably controlled on current medications.   4. Palpitations: Controlled with small dose Bystolic.     COVID-19 Education: The signs and symptoms of COVID-19 were discussed with the patient and how to seek care  for testing (follow up with PCP or arrange E-visit).  The importance of social distancing was discussed today.  Time:   Today, I have spent 12 minutes with the patient with telehealth technology discussing the above problems.     Medication Adjustments/Labs and Tests Ordered: Current medicines are reviewed at length with the patient today.  Concerns regarding medicines are outlined above.   Tests Ordered: No orders of the defined types were placed in this encounter.   Medication Changes: No orders of the defined types were placed in this encounter.   Disposition:  Follow up in 6 month(s)  Signed, Lorine Bears, MD  02/28/2019 9:09 AM    [facility]

## 2019-03-18 ENCOUNTER — Other Ambulatory Visit: Payer: Self-pay | Admitting: Family Medicine

## 2019-03-18 DIAGNOSIS — K219 Gastro-esophageal reflux disease without esophagitis: Secondary | ICD-10-CM

## 2019-03-23 ENCOUNTER — Other Ambulatory Visit: Payer: Self-pay | Admitting: Family Medicine

## 2019-03-23 DIAGNOSIS — F419 Anxiety disorder, unspecified: Secondary | ICD-10-CM

## 2019-03-24 ENCOUNTER — Other Ambulatory Visit: Payer: Self-pay | Admitting: *Deleted

## 2019-03-25 MED ORDER — VASCEPA 1 G PO CAPS: 2.0000 g | ORAL_CAPSULE | Freq: Two times a day (BID) | ORAL | 1 refills | Status: DC

## 2019-03-27 ENCOUNTER — Ambulatory Visit (INDEPENDENT_AMBULATORY_CARE_PROVIDER_SITE_OTHER): Payer: BLUE CROSS/BLUE SHIELD | Admitting: Family Medicine

## 2019-03-27 ENCOUNTER — Encounter: Payer: Self-pay | Admitting: Family Medicine

## 2019-03-27 ENCOUNTER — Other Ambulatory Visit: Payer: Self-pay

## 2019-03-27 DIAGNOSIS — K219 Gastro-esophageal reflux disease without esophagitis: Secondary | ICD-10-CM

## 2019-03-27 DIAGNOSIS — I2510 Atherosclerotic heart disease of native coronary artery without angina pectoris: Secondary | ICD-10-CM

## 2019-03-27 DIAGNOSIS — E785 Hyperlipidemia, unspecified: Secondary | ICD-10-CM

## 2019-03-27 DIAGNOSIS — I10 Essential (primary) hypertension: Secondary | ICD-10-CM

## 2019-03-27 DIAGNOSIS — F419 Anxiety disorder, unspecified: Secondary | ICD-10-CM | POA: Diagnosis not present

## 2019-03-27 DIAGNOSIS — J4540 Moderate persistent asthma, uncomplicated: Secondary | ICD-10-CM

## 2019-03-27 DIAGNOSIS — R7303 Prediabetes: Secondary | ICD-10-CM

## 2019-03-27 MED ORDER — ALBUTEROL SULFATE HFA 108 (90 BASE) MCG/ACT IN AERS: INHALATION_SPRAY | RESPIRATORY_TRACT | 1 refills | Status: DC

## 2019-03-27 MED ORDER — RABEPRAZOLE SODIUM 20 MG PO TBEC: 20.0000 mg | DELAYED_RELEASE_TABLET | Freq: Every day | ORAL | 1 refills | Status: DC

## 2019-03-27 MED ORDER — ESCITALOPRAM OXALATE 10 MG PO TABS: 10.0000 mg | ORAL_TABLET | Freq: Every day | ORAL | 1 refills | Status: DC

## 2019-03-27 MED ORDER — BREO ELLIPTA 100-25 MCG/INH IN AEPB: 1.0000 | INHALATION_SPRAY | Freq: Every day | RESPIRATORY_TRACT | 1 refills | Status: DC

## 2019-03-27 MED ORDER — CLOPIDOGREL BISULFATE 75 MG PO TABS: 75.0000 mg | ORAL_TABLET | Freq: Every day | ORAL | 1 refills | Status: DC

## 2019-03-27 NOTE — Progress Notes (Signed)
Name: Paul Page   MRN: [id number]    DOB: 02-29-1956   Date:03/27/2019       Progress Note  Subjective  Chief Complaint  Chief Complaint  Patient presents with  . Coronary Artery Disease  . Anxiety  . Hypertension  . Dyslipidemia    I connected with  Nolberto Hanlon  on 03/27/19 at  8:40 AM EDT by a video enabled telemedicine application and verified that I am speaking with the correct person using two identifiers.  I discussed the limitations of evaluation and management by telemedicine and the availability of in person appointments. The patient expressed understanding and agreed to proceed. Staff also discussed with the patient that there may be a patient responsible charge related to this service. Patient Location: at home  Provider Location: [place]   HPI  HTN: taking medication, denies chest pain or palpitation. BP this am when he first checked was elevated, he has not been going to the gym, advised to walk 30 minutes daily and call back if stays elevated  GERD: doing well on medication, no heart burn or indigestion  Asthma: he is taking Breo, denies wheezing or SOB, he states usually worse in the Spring and Fall but doing well at this time. He has high allergy to pine ( cleaners/pine needles...) , it triggers his allergies and asthma, but last week he got pine straw and did not have any problems afterwards   Hyperlipidemia: taking  Crestor,  Zetia, Vascepa. . He has CAD but denies chest pain or decrease in exercise tolerance.  Denies chest pain or palpitation. Reviewed labs and last LDL at goal   CAD: s/p stent placement, last one in 2011  under the care of Dr Fletcher Anon, on aspirin , plavix and statin therapy. No pain at this time, normal exercise tolerance. We will recheck labs, on Zetia and Vascepa  Hyperglycemia: last hgbA1C 5.8%  denies polyphagia, polydipsia or polyuria. He states he has a sweet tooth, but is trying to do better  Anxiety:  taking Lexapro, has to take it at night because it makes him feel tired, controlling symptoms, unchanged    Patient Active Problem List   Diagnosis Date Noted  . Pre-diabetes 09/20/2018  . Hypertension 04/12/2015  . Dyslipidemia 03/22/2015  . GERD (gastroesophageal reflux disease) 03/22/2015  . Asthma 03/22/2015  . Anxiety 03/22/2015  . Family history of ischemic heart disease (IHD) 10/24/2012  . History of fracture of rib 09/04/2011  . Traumatic pneumothorax 09/04/2011  . Coronary artery disease involving native coronary artery of native heart without angina pectoris 08/01/2007    Past Surgical History:  Procedure Laterality Date  . ANKLE FRACTURE SURGERY    . CARDIAC CATHETERIZATION  2011  . CARDIAC CATHETERIZATION  2008  . CORONARY STENT PLACEMENT  2008 & 2011    Family History  Problem Relation Age of Onset  . Parkinson's disease Mother   . Cancer Father     Social History   Socioeconomic History  . Marital status: Married    Spouse name: Bethena Roys  . Number of children: 2  . Years of education: 43  . Highest education level: Some college, no degree  Occupational History  . Occupation: self employed  Social Needs  . Financial resource strain: Not hard at all  . Food insecurity    Worry: Never true    Inability: Never true  . Transportation needs    Medical: No    Non-medical: No  Tobacco Use  .  Smoking status: Never Smoker  . Smokeless tobacco: Never Used  Substance and Sexual Activity  . Alcohol use: Yes    Alcohol/week: 0.0 standard drinks    Comment: occasional wine  . Drug use: No  . Sexual activity: Yes  Lifestyle  . Physical activity    Days per week: 3 days    Minutes per session: 80 min  . Stress: Not at all  Relationships  . Social Musicianconnections    Talks on phone: Twice a week    Gets together: Twice a week    Attends religious service: More than 4 times per year    Active member of club or organization: Yes    Attends meetings of clubs or  organizations: More than 4 times per year    Relationship status: Married  . Intimate partner violence    Fear of current or ex partner: No    Emotionally abused: No    Physically abused: No    Forced sexual activity: No  Other Topics Concern  . 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  albuterol (PROAIR HFA) 108 (90 Base) MCG/ACT inhaler, TAKE 2 PUFFS BY MOUTH EVERY 6 HOURS AS NEEDED FOR WHEEZE OR SHORTNESS OF BREATH, Disp: 18 g, Rfl: 1 .  albuterol (PROVENTIL) (2.5 MG/3ML) 0.083% nebulizer solution, Take 3 mLs (2.5 mg total) by nebulization every 6 (six) hours as needed for wheezing or shortness of breath., Disp: 150 mL, Rfl: 1 .  clopidogrel (PLAVIX) 75 MG tablet, Take 1 tablet (75 mg total) by mouth daily., Disp: 90 tablet, Rfl: 1 .  escitalopram (LEXAPRO) 10 MG tablet, Take 1 tablet (10 mg total) by mouth daily., Disp: 90 tablet, Rfl: 1 .  ezetimibe (ZETIA) 10 MG tablet, Take 1 tablet (10 mg total) by mouth daily., Disp: 90 tablet, Rfl: 3 .  fluticasone furoate-vilanterol (BREO ELLIPTA) 100-25 MCG/INH AEPB, Inhale 1 puff into the lungs daily., Disp: 270 each, Rfl: 1 .  Icosapent Ethyl (VASCEPA) 1 g CAPS, Take 2 capsules (2 g total) by mouth 2 (two) times daily with a meal., Disp: 120 capsule, Rfl: 1 .  losartan (COZAAR) 50 MG tablet, Take 1 tablet (50 mg total) by mouth daily., Disp: 90 tablet, Rfl: 1 .  Multiple Vitamins-Minerals (CENTRUM SILVER PO), Take 1 tablet by mouth daily., Disp: , Rfl:  .  nebivolol (BYSTOLIC) 5 MG tablet, Take 1 tablet (5 mg total) by mouth daily., Disp: 90 tablet, Rfl: 3 .  RABEprazole (ACIPHEX) 20 MG tablet, Take 1 tablet (20 mg total) by mouth daily., Disp: 90 tablet, Rfl: 1 .  rosuvastatin (CRESTOR) 40 MG tablet, TAKE 1 TABLET BY MOUTH EVERY DAY, Disp: 90 tablet, Rfl: 3  Allergies  Allergen Reactions  . Molds & Smuts Shortness Of Breath  . Fish Oil  Hives  . Peanut Oil   . Tetracyclines & Related Other (See Comments)    Intolerance - stomach upset    I personally reviewed active problem list, medication list, allergies, family history, social history with the patient/caregiver today.   ROS  Ten systems reviewed and is negative except as mentioned in HPI Mild weight gain due do to COVID-19   Objective  Vitals:   03/27/19 0759  Weight: 202 lb (91.6 kg)    Body mass index is 28.98 kg/m.  Physical Exam  Awake, alert and oriented  PHQ2/9: Depression  [facility] 03/27/2019 09/19/2018 04/17/2018 03/19/2018 11/16/2017  Decreased Interest 0 0 0 0 0  Down, Depressed, Hopeless 0 0 0 0 0  PHQ - 2 Score 0 0 0 0 0  Altered sleeping 0 0 1 0 -  Tired, decreased energy 0 0 1 0 -  Change in appetite 0 0 0 0 -  Feeling bad or failure about yourself  0 0 0 0 -  Trouble concentrating 0 0 0 0 -  Moving slowly or fidgety/restless 0 0 0 0 -  Suicidal thoughts 0 0 0 0 -  PHQ-9 Score 0 0 2 0 -  Difficult doing work/chores - Not difficult at all Not difficult at all Not difficult at all -   PHQ-2/9 Result is negative.    Fall Risk: Fall Risk  03/27/2019 09/19/2018 04/17/2018 03/19/2018 11/16/2017  Falls in the past year? 0 0 No No Yes  Number falls in past yr: 0 0 - - 1  Injury with Fall? 0 0 - - Yes  Comment - - - - -  Risk for fall due to : - - - - -  Follow up - - - - -     Assessment & Plan  1. Anxiety  - escitalopram (LEXAPRO) 10 MG tablet; Take 1 tablet (10 mg total) by mouth daily.  Dispense: 90 tablet; Refill: 1  2. Coronary artery disease involving native coronary artery of native heart without angina pectoris  - clopidogrel (PLAVIX) 75 MG tablet; Take 1 tablet (75 mg total) by mouth daily.  Dispense: 90 tablet; Refill: 1  3. Gastroesophageal reflux disease, esophagitis presence not specified  - RABEprazole (ACIPHEX) 20 MG tablet; Take 1 tablet (20 mg total) by mouth daily.  Dispense: 90 tablet; Refill: 1  4.  Moderate persistent asthma without complication  - fluticasone furoate-vilanterol (BREO ELLIPTA) 100-25 MCG/INH AEPB; Inhale 1 puff into the lungs daily.  Dispense: 270 each; Refill: 1 - albuterol (PROAIR HFA) 108 (90 Base) MCG/ACT inhaler; TAKE 2 PUFFS BY MOUTH EVERY 6 HOURS AS NEEDED FOR WHEEZE OR SHORTNESS OF BREATH  Dispense: 8 g; Refill: 5  5. Hypertension Benign   Monitor for now, slightly elevated but usually at goal  6. Dyslipidemia  Continue medication   7. Pre-diabetes  Follow a low sugar diet  I discussed the assessment and treatment plan with the patient. The patient was provided an opportunity to ask questions and all were answered. The patient agreed with the plan and demonstrated an understanding of the instructions.  The patient was advised to call back or seek an in-person evaluation if the symptoms worsen or if the condition fails to improve as anticipated.  I provided 25  minutes of non-face-to-face time during this encounter.

## 2019-04-24 NOTE — Telephone Encounter (Signed)
lmov to schedule  °

## 2019-05-22 ENCOUNTER — Other Ambulatory Visit: Payer: Self-pay | Admitting: Cardiovascular Disease

## 2019-06-22 ENCOUNTER — Other Ambulatory Visit: Payer: Self-pay | Admitting: Family Medicine

## 2019-06-22 DIAGNOSIS — J4540 Moderate persistent asthma, uncomplicated: Secondary | ICD-10-CM

## 2019-06-23 ENCOUNTER — Telehealth: Payer: Self-pay

## 2019-06-23 MED ORDER — LOSARTAN POTASSIUM 50 MG PO TABS: 50.0000 mg | ORAL_TABLET | Freq: Every day | ORAL | 0 refills | Status: DC

## 2019-06-23 MED ORDER — ROSUVASTATIN CALCIUM 40 MG PO TABS: 40.0000 mg | ORAL_TABLET | Freq: Every day | ORAL | 0 refills | Status: DC

## 2019-06-23 NOTE — Telephone Encounter (Signed)
Requested Prescriptions   Signed Prescriptions Disp Refills   rosuvastatin (CRESTOR) 40 MG tablet 90 tablet 0    Sig: Take 1 tablet (40 mg total) by mouth daily.    Authorizing Provider: ARIDA, MUHAMMAD A    Ordering User: NEWCOMER MCCLAIN, BRANDY L    

## 2019-06-23 NOTE — Addendum Note (Signed)
Addended by: Raelene Bott, Rylan Bernard L on: 06/23/2019 02:48 PM   Modules accepted: Orders

## 2019-06-23 NOTE — Telephone Encounter (Signed)
Requested Prescriptions   Signed Prescriptions Disp Refills  . rosuvastatin (CRESTOR) 40 MG tablet 90 tablet 0    Sig: Take 1 tablet (40 mg total) by mouth daily.    Authorizing Provider: Kathlyn Sacramento A    Ordering User: Raelene Bott, Keonna Raether L  . losartan (COZAAR) 50 MG tablet 90 tablet 0    Sig: Take 1 tablet (50 mg total) by mouth daily.    Authorizing Provider: Kathlyn Sacramento A    Ordering User: Raelene Bott, Tylyn Stankovich L

## 2019-06-23 NOTE — Telephone Encounter (Signed)
Requested medication (s) are due for refill today: yes  Requested medication (s) are on the active medication list: yes  Last refill:  06/01/2019  Future visit scheduled: yes  Notes to clinic: One inhaler should last at least one month. If the patient is requesting refills earlier   Requested Prescriptions  Pending Prescriptions Disp Refills   albuterol (VENTOLIN HFA) 108 (90 Base) MCG/ACT inhaler [Pharmacy Med Name: ALBUTEROL HFA (PROAIR) INHALER]  1    Sig: TAKE 2 PUFFS BY MOUTH EVERY 6 HOURS AS NEEDED FOR WHEEZE OR SHORTNESS OF BREATH     Pulmonology:  Beta Agonists Failed - 06/22/2019  4:45 PM      Failed - One inhaler should last at least one month. If the patient is requesting refills earlier, contact the patient to check for uncontrolled symptoms.      Passed - Valid encounter within last 12 months    Recent Outpatient Visits          2 months ago [facility], Drue Stager, MD   9 months ago Coronary artery disease involving native coronary artery of native heart without angina pectoris   [facility] Steele Sizer, MD   1 year ago Moderate persistent asthma with acute exacerbation   [place], FNP   1 year ago Coronary artery disease involving native coronary artery of native heart without angina pectoris   [facility] Steele Sizer, MD   1 year ago Essential hypertension   [place], MD      Future Appointments            In 3 months Steele Sizer, MD [facility], [facility]

## 2019-07-02 ENCOUNTER — Encounter: Payer: Self-pay | Admitting: Family Medicine

## 2019-07-13 ENCOUNTER — Other Ambulatory Visit: Payer: Self-pay | Admitting: Family Medicine

## 2019-07-13 DIAGNOSIS — J4540 Moderate persistent asthma, uncomplicated: Secondary | ICD-10-CM

## 2019-07-14 NOTE — Telephone Encounter (Signed)
lvm for scheduling °

## 2019-07-16 ENCOUNTER — Other Ambulatory Visit: Payer: Self-pay | Admitting: Cardiovascular Disease

## 2019-07-25 ENCOUNTER — Telehealth: Payer: Self-pay | Admitting: Family Medicine

## 2019-07-25 ENCOUNTER — Encounter: Payer: Self-pay | Admitting: Family Medicine

## 2019-07-25 ENCOUNTER — Ambulatory Visit (INDEPENDENT_AMBULATORY_CARE_PROVIDER_SITE_OTHER): Payer: BLUE CROSS/BLUE SHIELD | Admitting: Family Medicine

## 2019-07-25 VITALS — BP 141/86 | HR 81

## 2019-07-25 DIAGNOSIS — R21 Rash and other nonspecific skin eruption: Secondary | ICD-10-CM | POA: Diagnosis not present

## 2019-07-25 DIAGNOSIS — Z8619 Personal history of other infectious and parasitic diseases: Secondary | ICD-10-CM

## 2019-07-25 DIAGNOSIS — B001 Herpesviral vesicular dermatitis: Secondary | ICD-10-CM

## 2019-07-25 MED ORDER — VALACYCLOVIR HCL 1 G PO TABS: 1000.0000 mg | ORAL_TABLET | Freq: Three times a day (TID) | ORAL | 0 refills | Status: DC

## 2019-07-25 NOTE — Progress Notes (Signed)
Name: Paul Page   MRN: [id number]    DOB: 08-Sep-1956   Date:07/25/2019       Progress Note  Subjective  Chief Complaint  Chief Complaint  Patient presents with  . Rash    Had Shingles several years ago, Onset-couple of nights ago, broke out in a rash around his jaw line. Feels like he has shingles induced by stress. Has been putting Abreva on it- now under his left arm.   . Fatigue    Achy all over  . Diarrhea  . Mouth Lesions    I connected with  Paul Mohairichard Delamar on 07/25/19 at  3:20 PM EDT by telephone and verified that I am speaking with the correct person using two identifiers.  I discussed the limitations, risks, security and privacy concerns of performing an evaluation and management service by telephone and the availability of in person appointments. Staff also discussed with the patient that there may be a patient responsible charge related to this service. 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 Did not respond well to Abreva and is also having body aches and fatigue. Initially rash on lower lip, after that some bumps on right side of face. Today he noticed a very sensitive area on left inner arm, small red spot, but feels like his previous shingles episode. He has a 428 month old grand-daughter and is worried about spreading the virus. Discussed difference between herpes type 1 and zoster varicella virus. Explained treatment is the same. He should avoid being around grand-daughter since he can transmit chicken pox . Lesion on arm could be an early symptoms of zoster. We will start him on Valtrex tonight.   He will get flu vaccine at local pharmacy and call me back with date  Patient Active Problem List   Diagnosis Date Noted  . Pre-diabetes 09/20/2018  . Hypertension 04/12/2015  . Dyslipidemia 03/22/2015  . GERD (gastroesophageal reflux disease) 03/22/2015  . Asthma 03/22/2015  . Anxiety 03/22/2015  . Family history of ischemic heart disease (IHD) 10/24/2012  . History of fracture of rib 09/04/2011  . Traumatic pneumothorax 09/04/2011  . Coronary artery disease involving native coronary artery of native heart without angina pectoris 08/01/2007    Past Surgical History:  Procedure Laterality Date  . ANKLE FRACTURE SURGERY    . CARDIAC CATHETERIZATION  2011  . CARDIAC CATHETERIZATION  2008  . CORONARY STENT PLACEMENT  2008 & 2011    Family History  Problem Relation Age of Onset  . Parkinson's disease Mother   . Cancer Father     Social History   Socioeconomic History  . Marital status: Married    Spouse name: Darel HongJudy  . Number of children: 2  . Years of education: 11012  . Highest education level: Some college, no degree  Occupational History  . Occupation: self employed  Social Needs  . Financial resource strain: Not hard at all  . Food insecurity    Worry: Never true    Inability: Never true  . Transportation needs    Medical: No    Non-medical: No  Tobacco Use  . Smoking status: Never Smoker  . Smokeless tobacco: Never Used  Substance and Sexual Activity  . Alcohol use: Yes  Alcohol/week: 0.0 standard drinks    Comment: occasional wine  . Drug use: No  . Sexual activity: Yes  Lifestyle  . Physical activity    Days per week: 3 days    Minutes per session: 80 min  . Stress: Not at all  Relationships  . Social Herbalist on phone: Twice a week    Gets together: Twice a week    Attends religious service: More than 4 times per year    Active member of club or organization: Yes    Attends meetings of clubs or organizations: More than 4 times per year    Relationship status: Married  . Intimate partner violence    Fear of current or ex partner: No     Emotionally abused: No    Physically abused: No    Forced sexual activity: No  Other Topics Concern  . 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  albuterol (VENTOLIN HFA) 108 (90 Base) MCG/ACT inhaler, TAKE 2 PUFFS BY MOUTH EVERY 6 HOURS AS NEEDED FOR WHEEZE OR SHORTNESS OF BREATH, Disp: 8.5 g, Rfl: 0 .  clopidogrel (PLAVIX) 75 MG tablet, Take 1 tablet (75 mg total) by mouth daily., Disp: 90 tablet, Rfl: 1 .  escitalopram (LEXAPRO) 10 MG tablet, Take 1 tablet (10 mg total) by mouth daily., Disp: 90 tablet, Rfl: 1 .  ezetimibe (ZETIA) 10 MG tablet, Take 1 tablet (10 mg total) by mouth daily., Disp: 90 tablet, Rfl: 3 .  fluticasone furoate-vilanterol (BREO ELLIPTA) 100-25 MCG/INH AEPB, Inhale 1 puff into the lungs daily., Disp: 270 each, Rfl: 1 .  Icosapent Ethyl (VASCEPA) 1 g CAPS, Take 2 capsules (2 g total) by mouth 2 (two) times daily with a meal. *NEEDS OFFICE VISIT FOR FURTHER REFILLS*, Disp: 120 capsule, Rfl: 0 .  losartan (COZAAR) 50 MG tablet, Take 1 tablet (50 mg total) by mouth daily., Disp: 90 tablet, Rfl: 0 .  Multiple Vitamins-Minerals (CENTRUM SILVER PO), Take 1 tablet by mouth daily., Disp: , Rfl:  .  nebivolol (BYSTOLIC) 5 MG tablet, Take 1 tablet (5 mg total) by mouth daily., Disp: 90 tablet, Rfl: 3 .  RABEprazole (ACIPHEX) 20 MG tablet, Take 1 tablet (20 mg total) by mouth daily., Disp: 90 tablet, Rfl: 1 .  rosuvastatin (CRESTOR) 40 MG tablet, Take 1 tablet (40 mg total) by mouth daily., Disp: 90 tablet, Rfl: 0 .  albuterol (PROVENTIL) (2.5 MG/3ML) 0.083% nebulizer solution, Take 3 mLs (2.5 mg total) by nebulization every 6 (six) hours as needed for wheezing or shortness of breath. (Patient not taking: Reported on 07/25/2019), Disp: 150 mL, Rfl: 1 .  valACYclovir (VALTREX) 1000 MG tablet, Take 1 tablet (1,000 mg total) by mouth 3 (three) times daily., Disp: 30 tablet, Rfl:  0  Allergies  Allergen Reactions  . Molds & Smuts Shortness Of Breath  . Fish Oil Hives  . Peanut Oil   . Tetracyclines & Related Other (See Comments)    Intolerance - stomach upset    I personally reviewed active problem list, medication list, allergies, family history, social history with the patient/caregiver today.   ROS  Ten systems reviewed and is negative except as mentioned in HPI   Objective  Virtual encounter, vitals not obtained.  There is no height or weight on file to calculate BMI.  Physical Exam  Awake, alert  and oriented, fever blister on lower lip   PHQ2/9: Depression screen [facility] 2/9 07/25/2019 03/27/2019 09/19/2018 04/17/2018 03/19/2018  Decreased Interest 0 0 0 0 0  Down, Depressed, Hopeless 0 0 0 0 0  PHQ - 2 Score 0 0 0 0 0  Altered sleeping 0 0 0 1 0  Tired, decreased energy 0 0 0 1 0  Change in appetite 0 0 0 0 0  Feeling bad or failure about yourself  0 0 0 0 0  Trouble concentrating 0 0 0 0 0  Moving slowly or fidgety/restless 0 0 0 0 0  Suicidal thoughts 0 0 0 0 0  PHQ-9 Score 0 0 0 2 0  Difficult doing work/chores Not difficult at all - Not difficult at all Not difficult at all Not difficult at all   PHQ-2/9 Result is negative.    Fall Risk: Fall Risk  07/25/2019 03/27/2019 09/19/2018 04/17/2018 03/19/2018  Falls in the past year? 0 0 0 No No  Number falls in past yr: 0 0 0 - -  Injury with Fall? 0 0 0 - -  Comment - - - - -  Risk for fall due to : - - - - -  Follow up - - - - -     Assessment & Plan  1. Fever blister  - valACYclovir (VALTREX) 1000 MG tablet; Take 1 tablet (1,000 mg total) by mouth 3 (three) times daily.  Dispense: 30 tablet; Refill: 0  2. History of shingles  - valACYclovir (VALTREX) 1000 MG tablet; Take 1 tablet (1,000 mg total) by mouth 3 (three) times daily.  Dispense: 30 tablet; Refill: 0  3. Rash  - valACYclovir (VALTREX) 1000 MG tablet; Take 1 tablet (1,000 mg total) by mouth 3 (three) times daily.   Dispense: 30 tablet; Refill: 0  I discussed the assessment and treatment plan with the patient. The patient was provided an opportunity to ask questions and all were answered. The patient agreed with the plan and demonstrated an understanding of the instructions.   The patient was advised to call back or seek an in-person evaluation if the symptoms worsen or if the condition fails to improve as anticipated.  I provided 15 minutes of non-face-to-face time during this encounter.  Ruel Favors, MD

## 2019-07-25 NOTE — Telephone Encounter (Signed)
Asked wife to have husband call if he would like treatment. Unable to prescribe any medication per Dr. Ancil Boozer unless we have a visit. We are able to offer him a virtual visit if he sends a picture of his rash.

## 2019-07-25 NOTE — Telephone Encounter (Signed)
Pt wife called in and said that the patient has a break out of the shingles and they have their small granddaughter with them. He is coming in Monday but wants to know can you give him Valtrex till Monday or is there anything else you could do for him till Monday.

## 2019-07-28 ENCOUNTER — Ambulatory Visit: Payer: BLUE CROSS/BLUE SHIELD | Admitting: Family Medicine

## 2019-07-28 ENCOUNTER — Telehealth: Payer: Self-pay | Admitting: Family Medicine

## 2019-07-28 NOTE — Telephone Encounter (Signed)
Copied from [place] [id number]. Topic: General - Other >> Jul 28, 2019  4:54 PM Wynetta Emery, [place] C wrote: Reason for CRM: pt's spouse called in to get clarity on Rx duration for valACYclovir (VALTREX) 1000 MG tablet. She would like to know how long should pt take medication? also, how long will he be contagious?   CB: [phone]     Spoke with patient's wife after discussing with Dr. Ancil Boozer and informed her that Mr. Busta must take the medication until it's gone, which will be for 10 days.  Per Dr. Ancil Boozer patient will no longer be contagious after the 48 hour period and as long as the blisters have dried up.  Wife verified understanding.

## 2019-09-03 ENCOUNTER — Telehealth: Payer: Self-pay | Admitting: Cardiovascular Disease

## 2019-09-03 ENCOUNTER — Other Ambulatory Visit: Payer: Self-pay | Admitting: Cardiovascular Disease

## 2019-09-03 DIAGNOSIS — E782 Mixed hyperlipidemia: Secondary | ICD-10-CM

## 2019-09-03 DIAGNOSIS — I2510 Atherosclerotic heart disease of native coronary artery without angina pectoris: Secondary | ICD-10-CM

## 2019-09-03 NOTE — Telephone Encounter (Signed)
-----   Message from Anselm Pancoast, [place] sent at 09/03/2019  1:07 PM EST ----- Please contact this patient for a follow up with Dr. Fletcher Anon. He was to be seen in Aug. 2020. He is requesting a refill.  Thanks, Ivin Booty

## 2019-09-03 NOTE — Telephone Encounter (Signed)
lmov to schedule  °

## 2019-09-17 ENCOUNTER — Other Ambulatory Visit: Payer: Self-pay | Admitting: Cardiovascular Disease

## 2019-09-19 NOTE — Telephone Encounter (Signed)
Hi Dr. Ancil Boozer, It seems that we are no longer in network for this patient.  He might need to be referred to another cardiology practice in his network.  Thank you

## 2019-09-19 NOTE — Telephone Encounter (Signed)
-----   Message from Taylor R Bumgarner sent at 09/18/2019 10:06 AM EST ----- LMV to schedule ----- Message ----- From: Crespo, Sharon G, CMA Sent: 09/03/2019   1:07 PM EST To: [facility] Scheduling  Please contact this patient for a follow up with Dr. Arida. He was to be seen in Aug. 2020. He is requesting a refill.  Thanks, Sharon    

## 2019-09-19 NOTE — Telephone Encounter (Signed)
lmov to schedule  °

## 2019-09-19 NOTE — Telephone Encounter (Signed)
-----   Message from Marykay Lex sent at 09/18/2019 10:06 AM EST ----- LMV to schedule ----- Message ----- From: Anselm Pancoast, CMA Sent: 09/03/2019   1:07 PM EST To: [facility] Scheduling  Please contact this patient for a follow up with Dr. Fletcher Anon. He was to be seen in Aug. 2020. He is requesting a refill.  Thanks, Ivin Booty

## 2019-09-19 NOTE — Telephone Encounter (Signed)
lmov x 3 no response . Deleting recall.

## 2019-09-22 ENCOUNTER — Ambulatory Visit (INDEPENDENT_AMBULATORY_CARE_PROVIDER_SITE_OTHER): Payer: BLUE CROSS/BLUE SHIELD | Admitting: Family Medicine

## 2019-09-22 ENCOUNTER — Other Ambulatory Visit: Payer: Self-pay

## 2019-09-22 ENCOUNTER — Encounter: Payer: Self-pay | Admitting: Family Medicine

## 2019-09-22 VITALS — BP 124/80 | HR 75 | Temp 96.8°F | Resp 16 | Ht 70.0 in | Wt 200.4 lb

## 2019-09-22 DIAGNOSIS — E559 Vitamin D deficiency, unspecified: Secondary | ICD-10-CM

## 2019-09-22 DIAGNOSIS — Z23 Encounter for immunization: Secondary | ICD-10-CM

## 2019-09-22 DIAGNOSIS — I10 Essential (primary) hypertension: Secondary | ICD-10-CM | POA: Diagnosis not present

## 2019-09-22 DIAGNOSIS — I2510 Atherosclerotic heart disease of native coronary artery without angina pectoris: Secondary | ICD-10-CM | POA: Diagnosis not present

## 2019-09-22 DIAGNOSIS — E785 Hyperlipidemia, unspecified: Secondary | ICD-10-CM

## 2019-09-22 DIAGNOSIS — J4540 Moderate persistent asthma, uncomplicated: Secondary | ICD-10-CM | POA: Diagnosis not present

## 2019-09-22 DIAGNOSIS — R7303 Prediabetes: Secondary | ICD-10-CM

## 2019-09-22 DIAGNOSIS — F419 Anxiety disorder, unspecified: Secondary | ICD-10-CM

## 2019-09-22 DIAGNOSIS — K219 Gastro-esophageal reflux disease without esophagitis: Secondary | ICD-10-CM

## 2019-09-22 MED ORDER — RABEPRAZOLE SODIUM 20 MG PO TBEC: 20.0000 mg | DELAYED_RELEASE_TABLET | Freq: Every day | ORAL | 1 refills | Status: DC

## 2019-09-22 MED ORDER — EZETIMIBE 10 MG PO TABS: 10.0000 mg | ORAL_TABLET | Freq: Every day | ORAL | 0 refills | Status: DC

## 2019-09-22 MED ORDER — ROSUVASTATIN CALCIUM 40 MG PO TABS: 40.0000 mg | ORAL_TABLET | Freq: Every day | ORAL | 1 refills | Status: DC

## 2019-09-22 MED ORDER — ESCITALOPRAM OXALATE 10 MG PO TABS: 10.0000 mg | ORAL_TABLET | Freq: Every day | ORAL | 1 refills | Status: DC

## 2019-09-22 MED ORDER — ICOSAPENT ETHYL 1 G PO CAPS: 2.0000 g | ORAL_CAPSULE | Freq: Two times a day (BID) | ORAL | 1 refills | Status: DC

## 2019-09-22 MED ORDER — LOSARTAN POTASSIUM 50 MG PO TABS: 50.0000 mg | ORAL_TABLET | Freq: Every day | ORAL | 1 refills | Status: DC

## 2019-09-22 MED ORDER — BREO ELLIPTA 100-25 MCG/INH IN AEPB: 1.0000 | INHALATION_SPRAY | Freq: Every day | RESPIRATORY_TRACT | 1 refills | Status: DC

## 2019-09-22 MED ORDER — CLOPIDOGREL BISULFATE 75 MG PO TABS: 75.0000 mg | ORAL_TABLET | Freq: Every day | ORAL | 1 refills | Status: DC

## 2019-09-22 NOTE — Progress Notes (Signed)
Name: Paul Page   MRN: [id number]    DOB: 1956/07/11   Date:09/22/2019       Progress Note  Subjective  Chief Complaint  Chief Complaint  Patient presents with  . Follow-up    6 month  . Medication Refill    HPI  HTN: taking medication, denies chest pain or palpitation. BP today is at goal. He is compliant with medication   GERD: doing well on medication, no heart burn or indigestion Continue medication  Asthma: he is taking Breo, denies wheezing or SOB, he states usually worse in the Spring and Fall but no recent flares.  He has high allergy to pine ( cleaners/pine needles...) , it triggers his allergies and asthma.  Hyperlipidemia:taking Crestor, Zetia, Vascepa.. He has CAD but denies chest pain or decrease in exercise tolerance. Denies chest pain or palpitation. He will have labs today   CAD: s/p stent placement, last one in 2011under the care of Dr Fletcher Anon, on aspirin but not daily  , plavix and statin therapy. No pain at this time, normal exercise tolerance. We will recheck labs today  on Zetia and Vascepa  Hyperglycemia: last hgbA1C 5.8%  denies polyphagia, polydipsia or polyuria.He has been more active.   Anxiety: taking Lexapro, has to take it at night because it makes him feel tired, controlling symptoms, he needs a refill.    Patient Active Problem List   Diagnosis Date Noted  . Pre-diabetes 09/20/2018  . Hypertension 04/12/2015  . Dyslipidemia 03/22/2015  . GERD (gastroesophageal reflux disease) 03/22/2015  . Asthma 03/22/2015  . Anxiety 03/22/2015  . Family history of ischemic heart disease (IHD) 10/24/2012  . History of fracture of rib 09/04/2011  . Traumatic pneumothorax 09/04/2011  . Coronary artery disease involving native coronary artery of native heart without angina pectoris 08/01/2007    Past Surgical History:  Procedure Laterality Date  . ANKLE FRACTURE SURGERY    . CARDIAC CATHETERIZATION  2011  . CARDIAC CATHETERIZATION  2008  .  CORONARY STENT PLACEMENT  2008 & 2011    Family History  Problem Relation Age of Onset  . Parkinson's disease Mother   . Cancer Father     Social History   Socioeconomic History  . Marital status: Married    Spouse name: Bethena Roys  . Number of children: 2  . Years of education: 78  . Highest education level: Some college, no degree  Occupational History  . Occupation: self employed  Tobacco Use  . Smoking status: Never Smoker  . Smokeless tobacco: Never Used  Substance and Sexual Activity  . Alcohol use: Yes    Alcohol/week: 0.0 standard drinks    Comment: occasional wine  . Drug use: No  . Sexual activity: Yes  Other Topics Concern  . 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 Difficulty of Paying Living Expenses: Not hard at all  Food Insecurity: No Food Insecurity  . Worried About Charity fundraiser in the Last Year: Never true  . Ran Out of Food in the Last Year: Never true  Transportation Needs: No Transportation Needs  . Lack of Transportation (Medical): No  . Lack of Transportation (Non-Medical): No  Physical Activity: Sufficiently Active  . Days of Exercise per Week: 4 days  .  Minutes of Exercise per Session: 90 min  Stress: No Stress Concern Present  . Feeling of Stress : Not at all  Social Connections: Slightly Isolated  . Frequency of Communication with Friends and Family: More than three times a week  . Frequency of Social Gatherings with Friends and Family: More than three times a week  . Attends Religious Services: More than 4 times per year  . Active Member of Clubs or Organizations: No  . Attends BankerClub or Organization Meetings: Never  . Marital Status: Married  Catering managerntimate Partner Violence: Not At Risk  . Fear of Current or Ex-Partner: No  . Emotionally Abused: No  .  Physically Abused: No  . Sexually Abused: No     Current Outpatient Medications:  .  albuterol (VENTOLIN HFA) 108 (90 Base) MCG/ACT inhaler, TAKE 2 PUFFS BY MOUTH EVERY 6 HOURS AS NEEDED FOR WHEEZE OR SHORTNESS OF BREATH, Disp: 8.5 g, Rfl: 0 .  clopidogrel (PLAVIX) 75 MG tablet, Take 1 tablet (75 mg total) by mouth daily., Disp: 90 tablet, Rfl: 1 .  escitalopram (LEXAPRO) 10 MG tablet, Take 1 tablet (10 mg total) by mouth daily., Disp: 90 tablet, Rfl: 1 .  fluticasone furoate-vilanterol (BREO ELLIPTA) 100-25 MCG/INH AEPB, Inhale 1 puff into the lungs daily., Disp: 270 each, Rfl: 1 .  icosapent Ethyl (VASCEPA) 1 g capsule, Take 2 capsules (2 g total) by mouth 2 (two) times daily., Disp: 360 capsule, Rfl: 1 .  losartan (COZAAR) 50 MG tablet, Take 1 tablet (50 mg total) by mouth daily., Disp: 90 tablet, Rfl: 1 .  Multiple Vitamins-Minerals (CENTRUM SILVER PO), Take 1 tablet by mouth daily., Disp: , Rfl:  .  nebivolol (BYSTOLIC) 5 MG tablet, Take 1 tablet (5 mg total) by mouth daily., Disp: 90 tablet, Rfl: 3 .  RABEprazole (ACIPHEX) 20 MG tablet, Take 1 tablet (20 mg total) by mouth daily., Disp: 90 tablet, Rfl: 1 .  rosuvastatin (CRESTOR) 40 MG tablet, Take 1 tablet (40 mg total) by mouth daily., Disp: 90 tablet, Rfl: 1 .  valACYclovir (VALTREX) 1000 MG tablet, Take 1 tablet (1,000 mg total) by mouth 3 (three) times daily., Disp: 30 tablet, Rfl: 0 .  ezetimibe (ZETIA) 10 MG tablet, Take 1 tablet (10 mg total) by mouth daily., Disp: 90 tablet, Rfl: 0  Allergies  Allergen Reactions  . Molds & Smuts Shortness Of Breath  . Fish Oil Hives  . Peanut Oil   . Tetracyclines & Related Other (See Comments)    Intolerance - stomach upset    I personally reviewed active problem list, medication list, allergies, family history, social history, health maintenance with the patient/caregiver today.   ROS  Constitutional: Negative for fever or weight change.  Respiratory: Negative for cough and  shortness of breath.   Cardiovascular: Negative for chest pain or palpitations.  Gastrointestinal: Negative for abdominal pain, no bowel changes.  Musculoskeletal: Negative for gait problem or joint swelling.  Skin: Negative for rash.  Neurological: Negative for dizziness or headache.  No other specific complaints in a complete review of systems (except as listed in HPI above).  Objective  Vitals:   09/22/19 0927  BP: 124/80  Pulse: 75  Resp: 16  Temp: (!) 96.8 F (36 C)  SpO2: 93%  Weight: 200 lb 6.4 oz (90.9 kg)  Height: 5\' 10"  (1.778 m)    Body mass index is 28.75 kg/m.  Physical Exam  Constitutional: Patient appears well-developed and well-nourished. Overweight.No distress.  HEENT: head atraumatic, normocephalic, pupils  equal and reactive to light, Cardiovascular: Normal rate, regular rhythm and normal heart sounds.  No murmur heard. No BLE edema. Pulmonary/Chest: Effort normal and breath sounds normal. No respiratory distress. Abdominal: Soft.  There is no tenderness. Psychiatric: Patient has a normal mood and affect. behavior is normal. Judgment and thought content normal.   PHQ2/9: Depression screen [facility] 2/9 09/22/2019 07/25/2019 03/27/2019 09/19/2018 04/17/2018  Decreased Interest 0 0 0 0 0  Down, Depressed, Hopeless 0 0 0 0 0  PHQ - 2 Score 0 0 0 0 0  Altered sleeping 0 0 0 0 1  Tired, decreased energy 0 0 0 0 1  Change in appetite 0 0 0 0 0  Feeling bad or failure about yourself  0 0 0 0 0  Trouble concentrating 0 0 0 0 0  Moving slowly or fidgety/restless 0 0 0 0 0  Suicidal thoughts 0 0 0 0 0  PHQ-9 Score 0 0 0 0 2  Difficult doing work/chores Not difficult at all Not difficult at all - Not difficult at all Not difficult at all    phq 9 is negative   Fall Risk: Fall Risk  09/22/2019 07/25/2019 03/27/2019 09/19/2018 04/17/2018  Falls in the past year? 0 0 0 0 No  Number falls in past yr: 0 0 0 0 -  Injury with Fall? 0 0 0 0 -  Comment - - - - -  Risk  for fall due to : - - - - -  Follow up - - - - -     Functional Status Survey: Is the patient deaf or have difficulty hearing?: No Does the patient have difficulty seeing, even when wearing glasses/contacts?: No Does the patient have difficulty concentrating, remembering, or making decisions?: No Does the patient have difficulty walking or climbing stairs?: No Does the patient have difficulty dressing or bathing?: No Does the patient have difficulty doing errands alone such as visiting a doctor's office or shopping?: No   Assessment & Plan   1. Essential hypertension  - losartan (COZAAR) 50 MG tablet; Take 1 tablet (50 mg total) by mouth daily.  Dispense: 90 tablet; Refill: 1 - CBC with Differential/Platelet - Comprehensive metabolic panel  2. Coronary artery disease involving native coronary artery of native heart without angina pectoris  - losartan (COZAAR) 50 MG tablet; Take 1 tablet (50 mg total) by mouth daily.  Dispense: 90 tablet; Refill: 1 - clopidogrel (PLAVIX) 75 MG tablet; Take 1 tablet (75 mg total) by mouth daily.  Dispense: 90 tablet; Refill: 1 - rosuvastatin (CRESTOR) 40 MG tablet; Take 1 tablet (40 mg total) by mouth daily.  Dispense: 90 tablet; Refill: 1 - icosapent Ethyl (VASCEPA) 1 g capsule; Take 2 capsules (2 g total) by mouth 2 (two) times daily.  Dispense: 360 capsule; Refill: 1 - ezetimibe (ZETIA) 10 MG tablet; Take 1 tablet (10 mg total) by mouth daily.  Dispense: 90 tablet; Refill: 0  3. Moderate persistent asthma without complication  - fluticasone furoate-vilanterol (BREO ELLIPTA) 100-25 MCG/INH AEPB; Inhale 1 puff into the lungs daily.  Dispense: 270 each; Refill: 1  4. Dyslipidemia  - icosapent Ethyl (VASCEPA) 1 g capsule; Take 2 capsules (2 g total) by mouth 2 (two) times daily.  Dispense: 360 capsule; Refill: 1 - ezetimibe (ZETIA) 10 MG tablet; Take 1 tablet (10 mg total) by mouth daily.  Dispense: 90 tablet; Refill: 0 - Lipid panel  5.  Pre-diabetes  - Hemoglobin A1c  6. Vitamin D deficiency  Continue otc vitamin D   7. Anxiety  - escitalopram (LEXAPRO) 10 MG tablet; Take 1 tablet (10 mg total) by mouth daily.  Dispense: 90 tablet; Refill: 1  8. Gastroesophageal reflux disease without esophagitis  - RABEprazole (ACIPHEX) 20 MG tablet; Take 1 tablet (20 mg total) by mouth daily.  Dispense: 90 tablet; Refill: 1  9. Need for shingles vaccine  - Shingrix

## 2019-09-23 NOTE — Telephone Encounter (Signed)
Patient called. He will see who is in network as well.

## 2019-09-23 NOTE — Telephone Encounter (Signed)
Please place referral. Coordinators will find out who is in network for him.

## 2019-09-25 LAB — COMPREHENSIVE METABOLIC PANEL
ALT: 29 IU/L (ref 0–44)
AST: 28 IU/L (ref 0–40)
Albumin/Globulin Ratio: 2.1 (ref 1.2–2.2)
Albumin: 4.4 g/dL (ref 3.8–4.8)
Alkaline Phosphatase: 66 IU/L (ref 39–117)
BUN/Creatinine Ratio: 18 (ref 10–24)
BUN: 18 mg/dL (ref 8–27)
Bilirubin Total: 0.5 mg/dL (ref 0.0–1.2)
CO2: 22 mmol/L (ref 20–29)
Calcium: 9.6 mg/dL (ref 8.6–10.2)
Chloride: 102 mmol/L (ref 96–106)
Creatinine, Ser: 1.02 mg/dL (ref 0.76–1.27)
GFR calc Af Amer: 90 mL/min/{1.73_m2} (ref >59)
GFR calc non Af Amer: 78 mL/min/{1.73_m2} (ref >59)
Globulin, Total: 2.1 g/dL (ref 1.5–4.5)
Glucose: 98 mg/dL (ref 65–99)
Potassium: 4.4 mmol/L (ref 3.5–5.2)
Sodium: 139 mmol/L (ref 134–144)
Total Protein: 6.5 g/dL (ref 6.0–8.5)

## 2019-09-25 LAB — CBC WITH DIFFERENTIAL/PLATELET
Basophils Absolute: 0 10*3/uL (ref 0.0–0.2)
Basos: 1 %
EOS (ABSOLUTE): 0.3 10*3/uL (ref 0.0–0.4)
Eos: 5 %
Hematocrit: 40.7 % (ref 37.5–51.0)
Hemoglobin: 13.6 g/dL (ref 13.0–17.7)
Immature Grans (Abs): 0 10*3/uL (ref 0.0–0.1)
Immature Granulocytes: 0 %
Lymphocytes Absolute: 1 10*3/uL (ref 0.7–3.1)
Lymphs: 17 %
MCH: 31.5 pg (ref 26.6–33.0)
MCHC: 33.4 g/dL (ref 31.5–35.7)
MCV: 94 fL (ref 79–97)
Monocytes Absolute: 0.7 10*3/uL (ref 0.1–0.9)
Monocytes: 11 %
Neutrophils Absolute: 3.8 10*3/uL (ref 1.4–7.0)
Neutrophils: 66 %
Platelets: 269 10*3/uL (ref 150–450)
RBC: 4.32 x10E6/uL (ref 4.14–5.80)
RDW: 13.1 % (ref 11.6–15.4)
WBC: 5.8 10*3/uL (ref 3.4–10.8)

## 2019-09-25 LAB — LIPID PANEL
Chol/HDL Ratio: 2 ratio (ref 0.0–5.0)
Cholesterol, Total: 127 mg/dL (ref 100–199)
HDL: 64 mg/dL (ref >39)
LDL Chol Calc (NIH): 43 mg/dL (ref 0–99)
Triglycerides: 114 mg/dL (ref 0–149)
VLDL Cholesterol Cal: 20 mg/dL (ref 5–40)

## 2019-09-25 LAB — HEMOGLOBIN A1C
Est. average glucose Bld gHb Est-mCnc: 117 mg/dL
Hgb A1c MFr Bld: 5.7 % — ABNORMAL HIGH (ref 4.8–5.6)

## 2019-09-27 ENCOUNTER — Other Ambulatory Visit: Payer: Self-pay | Admitting: Family Medicine

## 2019-09-27 DIAGNOSIS — J4540 Moderate persistent asthma, uncomplicated: Secondary | ICD-10-CM

## 2019-09-29 ENCOUNTER — Ambulatory Visit: Payer: BLUE CROSS/BLUE SHIELD | Admitting: Family Medicine

## 2019-10-06 ENCOUNTER — Other Ambulatory Visit: Payer: Self-pay | Admitting: Family Medicine

## 2019-10-06 DIAGNOSIS — I2510 Atherosclerotic heart disease of native coronary artery without angina pectoris: Secondary | ICD-10-CM

## 2019-10-21 ENCOUNTER — Other Ambulatory Visit: Payer: Self-pay | Admitting: Family Medicine

## 2019-10-21 DIAGNOSIS — J4540 Moderate persistent asthma, uncomplicated: Secondary | ICD-10-CM

## 2019-10-21 NOTE — Telephone Encounter (Signed)
Requested Prescriptions  Pending Prescriptions Disp Refills  . albuterol (VENTOLIN HFA) 108 (90 Base) MCG/ACT inhaler [Pharmacy Med Name: ALBUTEROL HFA (PROAIR) INHALER]      Sig: INHALE 2 PUFFS INTO THE LUNGS EVERY 6 HOURS AS NEEDED FOR WHEEZING OR SHORTNESS OF BREATH     Pulmonology:  Beta Agonists Failed - 10/21/2019  1:23 AM      Failed - One inhaler should last at least one month. If the patient is requesting refills earlier, contact the patient to check for uncontrolled symptoms.      Passed - Valid encounter within last 12 months    Recent Outpatient Visits          4 weeks ago Essential hypertension   [facility] Alba Cory, MD   2 months ago Fever blister   [facility] Alba Cory, MD   6 months ago Anxiety   [facility] [place], Danna Hefty, MD   1 year ago Coronary artery disease involving native coronary artery of native heart without angina pectoris   [facility] Alba Cory, MD   1 year ago Moderate persistent asthma with acute exacerbation   [facility] Doren Custard, FNP      Future Appointments            In 5 months Alba Cory, MD [facility], [facility]

## 2019-10-22 ENCOUNTER — Other Ambulatory Visit: Payer: Self-pay | Admitting: Family Medicine

## 2019-10-22 DIAGNOSIS — K219 Gastro-esophageal reflux disease without esophagitis: Secondary | ICD-10-CM

## 2019-10-22 DIAGNOSIS — B001 Herpesviral vesicular dermatitis: Secondary | ICD-10-CM

## 2019-10-22 DIAGNOSIS — R21 Rash and other nonspecific skin eruption: Secondary | ICD-10-CM

## 2019-10-22 DIAGNOSIS — Z8619 Personal history of other infectious and parasitic diseases: Secondary | ICD-10-CM

## 2019-10-22 NOTE — Telephone Encounter (Signed)
Medication Refill - Medication: valACYclovir (VALTREX) 1000 MG tablet  Has the patient contacted their pharmacy? No - shingles presented on right side of neck this morning (Agent: If no, request that the patient contact the pharmacy for the refill.) (Agent: If yes, when and what did the pharmacy advise?)  Preferred Pharmacy (with phone number or street name):  [id number] - [place], [place] [address] Phone:  [phone]  Fax:  [phone]     Agent: Please be advised that RX refills may take up to 3 business days. We ask that you follow-up with your pharmacy.

## 2019-10-23 MED ORDER — VALACYCLOVIR HCL 1 G PO TABS: 1000.0000 mg | ORAL_TABLET | Freq: Three times a day (TID) | ORAL | 0 refills | Status: DC

## 2019-12-02 ENCOUNTER — Other Ambulatory Visit: Payer: Self-pay | Admitting: Cardiovascular Disease

## 2019-12-02 NOTE — Telephone Encounter (Signed)
Noted  

## 2019-12-02 NOTE — Telephone Encounter (Signed)
Bystolic refilled # 30 R-0 with message that future refill rqst will need to be sent to the patient's new cardiologist or pcp.

## 2019-12-02 NOTE — Telephone Encounter (Signed)
Please schedule overdue F/U with Dr. Arida. Thank you! 

## 2019-12-02 NOTE — Telephone Encounter (Signed)
Per telephone note on 09/23/2019 patient insurance is no longer in network and supposed to be finding another cardiologist who is. Left message for patient to call back for update. Please advise if OK to refill. Thank you!

## 2019-12-02 NOTE — Telephone Encounter (Signed)
Patient is switching to [facility] system due to insurance. PCP is helping patient with medications at this time

## 2019-12-29 ENCOUNTER — Ambulatory Visit: Payer: Self-pay | Attending: Internal Medicine

## 2019-12-29 DIAGNOSIS — Z23 Encounter for immunization: Secondary | ICD-10-CM

## 2019-12-29 NOTE — Progress Notes (Signed)
   Covid-19 Vaccination Clinic  Name:  Paul Page    MRN: [id number] DOB: Sep 13, 1956  12/29/2019  [name] was observed post Covid-19 immunization for 15 minutes without incident. He was provided with Vaccine Information Sheet and instruction to access the V-Safe system.   [name] was instructed to call 911 with any severe reactions post vaccine: Marland Kitchen Difficulty breathing  . Swelling of face and throat  . A fast heartbeat  . A bad rash all over body  . Dizziness and weakness   Immunizations Administered    Name Date Dose VIS Date Route   Pfizer COVID-19 Vaccine 12/29/2019  1:44 PM 0.3 mL 09/12/2019 Intramuscular   Manufacturer: [employer], [employer]   Lot: [id number]   NDC: [address]-3

## 2020-01-01 ENCOUNTER — Telehealth: Payer: Self-pay | Admitting: Family Medicine

## 2020-01-01 NOTE — Telephone Encounter (Signed)
Pt wife called and stated that pt needs a PA for medications icosapent Ethyl (VASCEPA) 1 g capsule [[id number]] nebivolol (BYSTOLIC) 5 MG tablet [[id number]]

## 2020-01-02 NOTE — Telephone Encounter (Signed)
Patient wife notified PA was done on Vascepa 1g capsules and was approved-[facility]. Then for the Bystolic Dr. Carlynn Purl wants that medication filled by Cardiologist Dr. Kirke Corin. Patient wife Gemayel Mascio notified by phone.

## 2020-01-12 ENCOUNTER — Ambulatory Visit: Payer: BLUE CROSS/BLUE SHIELD

## 2020-01-19 ENCOUNTER — Ambulatory Visit: Payer: Self-pay | Attending: Internal Medicine

## 2020-01-19 DIAGNOSIS — Z23 Encounter for immunization: Secondary | ICD-10-CM

## 2020-01-19 NOTE — Progress Notes (Signed)
   Covid-19 Vaccination Clinic  Name:  Paul Page    MRN: [id number] DOB: 01-01-1956  01/19/2020  [name] was observed post Covid-19 immunization for 15 minutes without incident. He was provided with Vaccine Information Sheet and instruction to access the V-Safe system.   [name] was instructed to call 911 with any severe reactions post vaccine: Marland Kitchen Difficulty breathing  . Swelling of face and throat  . A fast heartbeat  . A bad rash all over body  . Dizziness and weakness   Immunizations Administered    Name Date Dose VIS Date Route   Pfizer COVID-19 Vaccine 01/19/2020  1:17 PM 0.3 mL 11/26/2018 Intramuscular   Manufacturer: [employer], [employer]   Lot: [id number]   NDC: [address]-5

## 2020-01-21 ENCOUNTER — Ambulatory Visit: Payer: Self-pay

## 2020-03-24 ENCOUNTER — Encounter: Payer: BLUE CROSS/BLUE SHIELD | Admitting: Family Medicine

## 2020-04-08 ENCOUNTER — Telehealth: Payer: Self-pay | Admitting: Family Medicine

## 2020-04-08 DIAGNOSIS — I10 Essential (primary) hypertension: Secondary | ICD-10-CM

## 2020-04-08 DIAGNOSIS — I2510 Atherosclerotic heart disease of native coronary artery without angina pectoris: Secondary | ICD-10-CM

## 2020-04-08 DIAGNOSIS — E785 Hyperlipidemia, unspecified: Secondary | ICD-10-CM

## 2020-04-08 NOTE — Telephone Encounter (Signed)
Pt is no longer a pt here due to insurance reasons

## 2020-04-08 NOTE — Telephone Encounter (Signed)
Requested medication (s) are due for refill today: yes  Requested medication (s) are on the active medication list: yes  Last refill:  01/14/2020  Future visit scheduled: no  Notes to clinic:  verify for refills Looks like patient may have change provider per last appointment canceled    Requested Prescriptions  Pending Prescriptions Disp Refills   losartan (COZAAR) 50 MG tablet [Pharmacy Med Name: LOSARTAN POTASSIUM 50 MG TAB] 90 tablet 1    Sig: TAKE 1 TABLET BY MOUTH EVERY DAY      Cardiovascular:  Angiotensin Receptor Blockers Failed - 04/08/2020  8:21 AM      Failed - Cr in normal range and within 180 days    Creat  Date Value Ref Range Status  09/19/2018 1.14 0.70 - 1.25 mg/dL Final    Comment:    For patients >60 years of age, the reference limit for Creatinine is approximately 13% higher for people identified as African-American. .    Creatinine, Ser  Date Value Ref Range Status  09/24/2019 1.02 0.76 - 1.27 mg/dL Final          Failed - K in normal range and within 180 days    Potassium  Date Value Ref Range Status  09/24/2019 4.4 3.5 - 5.2 mmol/L Final          Failed - Valid encounter within last 6 months    Recent Outpatient Visits           6 months ago Essential hypertension   [facility] Alba Cory, MD   8 months ago Fever blister   [facility] Alba Cory, MD   1 year ago Anxiety   [facility] [place], Danna Hefty, MD   1 year ago Coronary artery disease involving native coronary artery of native heart without angina pectoris   [facility] Alba Cory, MD   1 year ago Moderate persistent asthma with acute exacerbation   [facility] [place], [place], [place]              Passed - Patient is not pregnant      Passed - Last BP in normal range    BP Readings from Last 1 Encounters:  09/22/19 124/80            VASCEPA 1 g capsule  [Pharmacy Med Name: VASCEPA 1 GM CAPSULE] 360 capsule 1    Sig: TAKE 2 CAPSULES (2 G TOTAL) BY MOUTH 2 (TWO) TIMES DAILY.      Off-Protocol Failed - 04/08/2020  8:21 AM      Failed - Medication not assigned to a protocol, review manually.      Passed - Valid encounter within last 12 months    Recent Outpatient Visits           6 months ago Essential hypertension   [facility] Alba Cory, MD   8 months ago Fever blister   [facility] Alba Cory, MD   1 year ago Anxiety   [facility] [place], Danna Hefty, MD   1 year ago Coronary artery disease involving native coronary artery of native heart without angina pectoris   [facility] Alba Cory, MD   1 year ago Moderate persistent asthma with acute exacerbation   [facility] Doren Custard, [place]

## 2020-04-30 ENCOUNTER — Other Ambulatory Visit: Payer: Self-pay | Admitting: Family Medicine

## 2020-04-30 DIAGNOSIS — I2510 Atherosclerotic heart disease of native coronary artery without angina pectoris: Secondary | ICD-10-CM

## 2020-04-30 DIAGNOSIS — I10 Essential (primary) hypertension: Secondary | ICD-10-CM

## 2020-12-25 ENCOUNTER — Other Ambulatory Visit: Payer: Self-pay | Admitting: Family Medicine

## 2020-12-25 DIAGNOSIS — I2510 Atherosclerotic heart disease of native coronary artery without angina pectoris: Secondary | ICD-10-CM

## 2020-12-26 NOTE — Telephone Encounter (Signed)
Per chart note-patient is no longer at this practice-refusing request.

## 2021-08-17 ENCOUNTER — Telehealth: Payer: Self-pay

## 2021-08-17 NOTE — Telephone Encounter (Signed)
Copied from CRM [id number]. Topic: Appointment Scheduling - Scheduling Inquiry for Clinic >> Aug 17, 2021  3:37 PM Randol Kern wrote: Reason for CRM: Pt wants to reestablish with PCP, now has medicare   bEst contact: [phone] >> Aug 17, 2021  3:56 PM Violeta Gelinas B wrote: Pt is asking if [facility] would except him back as her patient. He had been seen at [facility]. Let us know if you will except him back

## 2021-08-18 ENCOUNTER — Telehealth: Payer: Self-pay

## 2021-08-18 NOTE — Telephone Encounter (Signed)
Lvm for pt to call and schedule a re-establish appt with Dr Carlynn Purl

## 2021-12-31 ENCOUNTER — Encounter: Payer: Self-pay | Admitting: Emergency Medicine

## 2021-12-31 ENCOUNTER — Ambulatory Visit (INDEPENDENT_AMBULATORY_CARE_PROVIDER_SITE_OTHER): Payer: Medicare Other

## 2021-12-31 ENCOUNTER — Ambulatory Visit
Admission: EM | Admit: 2021-12-31 | Discharge: 2021-12-31 | Disposition: A | Discharge status: Home / Self Care | Payer: Medicare Other | Attending: Family Medicine | Admitting: Family Medicine

## 2021-12-31 DIAGNOSIS — J45901 Unspecified asthma with (acute) exacerbation: Secondary | ICD-10-CM

## 2021-12-31 DIAGNOSIS — R059 Cough, unspecified: Secondary | ICD-10-CM

## 2021-12-31 DIAGNOSIS — Z1152 Encounter for screening for COVID-19: Secondary | ICD-10-CM

## 2021-12-31 DIAGNOSIS — J189 Pneumonia, unspecified organism: Secondary | ICD-10-CM

## 2021-12-31 DIAGNOSIS — R0602 Shortness of breath: Secondary | ICD-10-CM | POA: Diagnosis not present

## 2021-12-31 MED ORDER — PROMETHAZINE-DM 6.25-15 MG/5ML PO SYRP: 5.0000 mL | ORAL_SOLUTION | Freq: Three times a day (TID) | ORAL | 0 refills | Status: DC | PRN

## 2021-12-31 MED ORDER — ACETAMINOPHEN 325 MG PO TABS
650.0000 mg | ORAL_TABLET | Freq: Once | ORAL | Status: AC
  Administered 2021-12-31: 650 mg via ORAL

## 2021-12-31 MED ORDER — CEFDINIR 300 MG PO CAPS: 300.0000 mg | ORAL_CAPSULE | Freq: Two times a day (BID) | ORAL | 0 refills | Status: AC

## 2021-12-31 MED ORDER — DEXAMETHASONE SODIUM PHOSPHATE 10 MG/ML IJ SOLN
10.0000 mg | Freq: Once | INTRAMUSCULAR | Status: AC
Start: 2021-12-31 — End: 2021-12-31
  Administered 2021-12-31: 10 mg via INTRAMUSCULAR

## 2021-12-31 MED ORDER — PREDNISONE 20 MG PO TABS: 40.0000 mg | ORAL_TABLET | Freq: Every day | ORAL | 0 refills | Status: DC

## 2021-12-31 NOTE — ED Triage Notes (Signed)
Pt presents with cough and chest congestion x 1 week. Pt states last night he heard some wheezing. He used his nebulizer this morning.  ?

## 2021-12-31 NOTE — ED Provider Notes (Signed)
?[facility] ? ? ? ?CSN: [id number] ?Arrival date & time: 12/31/21  0941 ? ? ?  ? ?History   ?Chief Complaint ?Chief Complaint  ?Patient presents with  ? Cough  ?  started with mild sore throat 7 days ago, went to runny nose, then nose/sinus congestion, body aches, low grade fever. cough, wheezing, colored sputum, now crackling sounds especially laying down upon exhale. - Entered by patient  ? ? ?HPI ?Jash Wahlen is a 66 y.o. male.  ? ?HPI ?Patient presents today for evaluation of sore throat, nasal drainage, cough, wheezing, productive cough. Symptoms onset 7 days ago and gradually worsened over the last few days with wheezing, fever, and cough. He has a history of controlled asthma. He has used his inhaler with minium improvement of symptoms. He is febrile on arrival. Denies known contact with anyone recently ill with COVID or flu. He has traveled recently.  Endorses active shortness of breath, fatigue, and wheezing. ? ?Past Medical History:  ?Diagnosis Date  ? Anxiety   ? Asthma   ? Coronary artery disease   ? Three-vessel coronary artery disease diagnosed in October 2008 with normal ejection fraction. PCI and drug-eluting stent placement to the LAD(3.0 x 8 mm Promus), left circumflex(2.5 x 12 mm Promus) and right coronary artery(3.5 x 12 mm Promus). Unstable angina in 2011. PCI of RCA with a 3.5 x 15 mm Promus and PCI of LAD with a 2.5 x 28 mm Promus drug-eluting stents   ? GERD (gastroesophageal reflux disease)   ? Heart disease   ? Hyperlipidemia   ? Hypertension   ? ? ?Patient Active Problem List  ? Diagnosis Date Noted  ? Pre-diabetes 09/20/2018  ? Hypertension 04/12/2015  ? Dyslipidemia 03/22/2015  ? GERD (gastroesophageal reflux disease) 03/22/2015  ? Asthma 03/22/2015  ? Anxiety 03/22/2015  ? Family history of ischemic heart disease (IHD) 10/24/2012  ? History of fracture of rib 09/04/2011  ? Traumatic pneumothorax 09/04/2011  ? Coronary artery disease involving native coronary artery of native  heart without angina pectoris 08/01/2007  ? ? ?Past Surgical History:  ?Procedure Laterality Date  ? ANKLE FRACTURE SURGERY    ? CARDIAC CATHETERIZATION  2011  ? CARDIAC CATHETERIZATION  2008  ? CORONARY STENT PLACEMENT  2008 & 2011  ? ? ? ? ? ?Home Medications   ? ?Prior to Admission medications   ?Medication Sig Start Date End Date Taking? Authorizing Provider  ?albuterol (VENTOLIN HFA) 108 (90 Base) MCG/ACT inhaler INHALE 2 PUFFS INTO THE LUNGS EVERY 6 HOURS AS NEEDED FOR WHEEZING OR SHORTNESS OF BREATH 10/21/19  Yes Sowles, Danna Hefty, MD  ?cefdinir (OMNICEF) 300 MG capsule Take 1 capsule (300 mg total) by mouth 2 (two) times daily for 10 days. 12/31/21 01/10/22 Yes Bing Neighbors, FNP  ?clopidogrel (PLAVIX) 75 MG tablet Take 1 tablet (75 mg total) by mouth daily. 09/22/19  Yes Sowles, Danna Hefty, MD  ?escitalopram (LEXAPRO) 10 MG tablet Take 1 tablet (10 mg total) by mouth daily. 09/22/19  Yes Sowles, Danna Hefty, MD  ?ezetimibe (ZETIA) 10 MG tablet Take 1 tablet (10 mg total) by mouth daily. 09/22/19 12/31/21 Yes Sowles, Danna Hefty, MD  ?fluticasone furoate-vilanterol (BREO ELLIPTA) 100-25 MCG/INH AEPB Inhale 1 puff into the lungs daily. 09/22/19  Yes Sowles, Danna Hefty, MD  ?losartan (COZAAR) 50 MG tablet TAKE 1 TABLET BY MOUTH EVERY DAY 04/08/20  Yes Sowles, Danna Hefty, MD  ?nebivolol (BYSTOLIC) 5 MG tablet Take 1 tablet (5 mg total) by mouth daily. Future refill request will need  to be sent to new Cardiologist 12/02/19  Yes [place] OuchArida, Muhammad A, MD  ?predniSONE (DELTASONE) 20 MG tablet Take 2 tablets (40 mg total) by mouth daily with breakfast. 12/31/21  Yes Bing NeighborsHarris, Shreyan Hinz S, FNP  ?promethazine-dextromethorphan (PROMETHAZINE-DM) 6.25-15 MG/5ML syrup Take 5 mLs by mouth 3 (three) times daily as needed for cough. 12/31/21  Yes Bing NeighborsHarris, Julina Altmann S, FNP  ?RABEprazole (ACIPHEX) 20 MG tablet Take 1 tablet (20 mg total) by mouth daily. 09/22/19  Yes Sowles, Danna HeftyKrichna, MD  ?rosuvastatin (CRESTOR) 40 MG tablet Take 1 tablet (40 mg total) by mouth  daily. 09/22/19  Yes Sowles, Danna HeftyKrichna, MD  ?VASCEPA 1 g capsule TAKE 2 CAPSULES (2 G TOTAL) BY MOUTH 2 (TWO) TIMES DAILY. 04/08/20  Yes Sowles, Danna HeftyKrichna, MD  ?Multiple Vitamins-Minerals (CENTRUM SILVER PO) Take 1 tablet by mouth daily. 01/14/1978   [provider]  ?valACYclovir (VALTREX) 1000 MG tablet Take 1 tablet (1,000 mg total) by mouth 3 (three) times daily. 10/23/19   Alba CorySowles, Krichna, MD  ? ? ?Family History ?Family History  ?Problem Relation Age of Onset  ? Parkinson's disease Mother   ? Cancer Father   ? ? ?Social History ?Social History  ? ?Tobacco Use  ? Smoking status: Never  ? Smokeless tobacco: Never  ?Vaping Use  ? Vaping Use: Never used  ?Substance Use Topics  ? Alcohol use: Yes  ?  Alcohol/week: 0.0 standard drinks  ?  Comment: occasional wine  ? Drug use: No  ? ? ? ?Allergies   ?Molds & smuts, Fish oil, Peanut oil, and Tetracyclines & related ? ? ?Review of Systems ?Review of Systems ?Pertinent negatives listed in HPI  ?Physical Exam ?Triage Vital Signs ?ED Triage Vitals [12/31/21 1056]  ?Enc Vitals Group  ?   BP 133/79  ?   Pulse Rate 85  ?   Resp 18  ?   Temp 100.3 ?F (37.9 ?C)  ?   Temp Source Oral  ?   SpO2 91 %  ?   Weight   ?   Height   ?   Head Circumference   ?   Peak Flow   ?   Pain Score   ?   Pain Loc   ?   Pain Edu?   ?   Excl. in GC?   ? ?No data found. ? ?Updated Vital Signs ?BP 133/79 (BP Location: Left Arm)   Pulse 85   Temp 100.3 ?F (37.9 ?C) (Oral)   Resp 18   SpO2 91%  ? ?Visual Acuity ?Right Eye Distance:   ?Left Eye Distance:   ?Bilateral Distance:   ? ?Right Eye Near:   ?Left Eye Near:    ?Bilateral Near:    ? ?Physical Exam ?Constitutional:   ?   Appearance: He is ill-appearing.  ?HENT:  ?   Head: Normocephalic and atraumatic.  ?   Right Ear: Tympanic membrane normal.  ?   Left Ear: Tympanic membrane normal.  ?   Nose: Congestion and rhinorrhea present.  ?Cardiovascular:  ?   Rate and Rhythm: Normal rate and regular rhythm.  ?Pulmonary:  ?   Breath sounds: Wheezing  and rhonchi present.  ?Musculoskeletal:  ?   Cervical back: Normal range of motion.  ?Lymphadenopathy:  ?   Cervical: No cervical adenopathy.  ?Skin: ?   General: Skin is warm.  ?   Capillary Refill: Capillary refill takes less than 2 seconds.  ?Neurological:  ?   General: No focal deficit present.  ?  Mental Status: He is alert.  ? ? ? ?UC Treatments / Results  ?Labs ?(all labs ordered are listed, but only abnormal results are displayed) ?Labs Reviewed  ?NOVEL CORONAVIRUS, NAA  ? ? ?EKG ? ? ?Radiology ?DG Chest 2 View ? ?Result Date: 12/31/2021 ?CLINICAL DATA:  Cough and shortness of breath. EXAM: CHEST - 2 VIEW COMPARISON:  None. FINDINGS: No pneumothorax. Mild deformity of the lateral right chest wall, likely healed rib fractures. No pulmonary nodules or masses. Mild opacity projected over the heart on the lateral view. No edema. The cardiomediastinal silhouette is unremarkable. IMPRESSION: 1. Mild opacity projected over the heart on the lateral view may represent infiltrate or confluence of shadows. Recommend short-term follow-up imaging to ensure resolution. 2. No other acute abnormalities. Apparent healed fractures on the right. Electronically Signed   By: Gerome Sam III M.D.   On: 12/31/2021 11:43   ? ?Procedures ?Procedures (including critical care time) ? ?Medications Ordered in UC ?Medications  ?acetaminophen (TYLENOL) tablet 650 mg (650 mg Oral Given 12/31/21 1115)  ?dexamethasone (DECADRON) injection 10 mg (10 mg Intramuscular Given 12/31/21 1131)  ? ? ?Initial Impression / Assessment and Plan / UC Course  ?I have reviewed the triage vital signs and the nursing notes. ? ?Pertinent labs & imaging results that were available during my care of the patient were reviewed by me and considered in my medical decision making (see chart for details). ? ?  ?Treating for acute pneumonia given findings on x-ray and clinical symptoms. Will obtain a COVID test to rule out infection as source of current symptoms.  Treatment per discharge medication orders. Patient given strict ER precautions if his symptoms worsen or doesn't readily improve with treatment. Patient verbalized understanding and agreement with plan. ?Final Cl

## 2021-12-31 NOTE — Discharge Instructions (Addendum)
Your x-ray showed left lower lobe pneumonia. ?Treating with antibiotics along with prednisone.  Complete entire course of medication.  Continue your nebulizer treatments at home.  Promethazine DM for cough. ?If any of your symptoms worsen or do not readily improve return for evaluation.  Your COVID test should result within 1 to 3 days.  Our office will notify you of your positive results ?

## 2022-01-01 LAB — NOVEL CORONAVIRUS, NAA: SARS-CoV-2, NAA: NOT DETECTED

## 2022-01-18 ENCOUNTER — Encounter: Payer: Self-pay | Admitting: Family Medicine

## 2022-01-18 ENCOUNTER — Ambulatory Visit: Payer: Medicare Other | Admitting: Family Medicine

## 2022-01-18 VITALS — BP 120/70 | HR 88 | Temp 98.2°F | Resp 16 | Ht 70.0 in | Wt 208.3 lb

## 2022-01-18 DIAGNOSIS — I10 Essential (primary) hypertension: Secondary | ICD-10-CM

## 2022-01-18 DIAGNOSIS — J45901 Unspecified asthma with (acute) exacerbation: Secondary | ICD-10-CM | POA: Diagnosis not present

## 2022-01-18 DIAGNOSIS — K219 Gastro-esophageal reflux disease without esophagitis: Secondary | ICD-10-CM

## 2022-01-18 DIAGNOSIS — J189 Pneumonia, unspecified organism: Secondary | ICD-10-CM | POA: Diagnosis not present

## 2022-01-18 DIAGNOSIS — Z7689 Persons encountering health services in other specified circumstances: Secondary | ICD-10-CM

## 2022-01-18 DIAGNOSIS — J329 Chronic sinusitis, unspecified: Secondary | ICD-10-CM

## 2022-01-18 DIAGNOSIS — I2510 Atherosclerotic heart disease of native coronary artery without angina pectoris: Secondary | ICD-10-CM

## 2022-01-18 DIAGNOSIS — J310 Chronic rhinitis: Secondary | ICD-10-CM

## 2022-01-18 DIAGNOSIS — F419 Anxiety disorder, unspecified: Secondary | ICD-10-CM

## 2022-01-18 DIAGNOSIS — E785 Hyperlipidemia, unspecified: Secondary | ICD-10-CM

## 2022-01-18 DIAGNOSIS — R052 Subacute cough: Secondary | ICD-10-CM

## 2022-01-18 MED ORDER — IPRATROPIUM-ALBUTEROL 0.5-2.5 (3) MG/3ML IN SOLN: 3.0000 mL | Freq: Four times a day (QID) | RESPIRATORY_TRACT | 1 refills | Status: DC | PRN

## 2022-01-18 MED ORDER — ESCITALOPRAM OXALATE 10 MG PO TABS: 10.0000 mg | ORAL_TABLET | Freq: Every day | ORAL | 1 refills | Status: DC

## 2022-01-18 MED ORDER — PROMETHAZINE-DM 6.25-15 MG/5ML PO SYRP: 5.0000 mL | ORAL_SOLUTION | Freq: Three times a day (TID) | ORAL | 0 refills | Status: DC | PRN

## 2022-01-18 MED ORDER — FLUTICASONE FUROATE-VILANTEROL 100-25 MCG/ACT IN AEPB: 1.0000 | INHALATION_SPRAY | Freq: Every day | RESPIRATORY_TRACT | 11 refills | Status: DC

## 2022-01-18 MED ORDER — PREDNISONE 20 MG PO TABS: ORAL_TABLET | ORAL | 0 refills | Status: DC

## 2022-01-18 MED ORDER — ICOSAPENT ETHYL 1 G PO CAPS: 2.0000 g | ORAL_CAPSULE | Freq: Two times a day (BID) | ORAL | 1 refills | Status: DC

## 2022-01-18 NOTE — Progress Notes (Signed)
? ? ?Patient ID: Paul Page, male    DOB: 13-Oct-1955, 66 y.o.   MRN: [id number] ? ?PCP: Alba Cory, MD ? ?Chief Complaint  ?Patient presents with  ? Cough  ?  Was seen at [facility] on 4/1 but cough has kept going. Dry cough hurts rib cage.  ? ? ?Subjective:  ? ?Paul Page is a 66 y.o. male, presents to clinic with CC of the following: ? ?Pt is a 66 y/o male presents for continued cough after being diagnosed and treated for pneumonia about 3 weeks ago, he also has a history of asthma which is well controlled with Breo and rescue albuterol inhaler and albuterol nebulizer treatments.  He took antibiotic cefdinir, burst of prednisone for 5 days, used inhalers and prescribed cough medicine and his symptoms of pneumonia started to improve however he has had a hacking dry cough that has worsened, occurs in the evening when coming home from work from about 5-10.  Its not as bad at night.  And throughout the day at work he coughs intermittently.  He cannot tolerate Mucinex.  He has not tried any other over-the-counter medications.  He tried a Hydrologist from his wife which did not help much today.  He has used his inhaler and did not improve his hacking evening cough very much. ?This morning he did feel little bit achy and possibly had a low-grade fever.  He feels including tickling the back of his throat and he does have some nasal drainage and a scratchy throat.  He is not on any allergy medications.  States his acid reflux is well controlled on his current medications. ?Denies any hemoptysis, weight changes, night sweats, dyspnea on exertion, wheeze, chest tightness or pleuritic chest pain.  His ribs are sore from coughing ? ?Cough ?This is a recurrent problem. The current episode started 1 to 4 weeks ago. The problem has been unchanged. Episode frequency: intermittent during day - severe hacking evening. The cough is Productive of sputum (mostly dry cough, when productive clear). Associated symptoms include  nasal congestion (mild), postnasal drip, rhinorrhea and a sore throat (scratchy). Pertinent negatives include no chest pain, chills, ear congestion, ear pain, fever, headaches, heartburn (well controlled gerd with meds), hemoptysis, myalgias, rash, shortness of breath, sweats, weight loss or wheezing. Nothing aggravates the symptoms. He has tried prescription cough suppressant, oral steroids, OTC cough suppressant and a beta-agonist inhaler for the symptoms. The treatment provided no relief. His past medical history is significant for asthma, bronchitis, environmental allergies and pneumonia. There is no history of COPD or emphysema.   ? ?He also had an insurance change in his reestablishing care with Dr. Carlynn Purl as his PCP.  Last year he had to go to [facility] for his primary care. ? ?Med list was reviewed with the patient ? ?Asthma, well controlled on Breo, uses albuterol nebs and rescue inhaler as needed -in the past year he has had bronchitis very badly and this pneumonia and in the past he is asthma symptoms were easily controlled by just using albuterol ? ?Hypertension ?Managed on nebivolol and losartan-his meds were recently reviewed by his prior PCP the beginning of this month ?BP here today is well controlled - he will need refills in about 3 months ?BP Readings from Last 3 Encounters:  ?01/18/22 120/70  ?12/31/21 133/79  ?09/22/19 124/80  ? ?HLD  ?Patient is currently managed on Crestor 40 mg, zetia and Vascepa -his prior PCP had to do prior authorizations and peer to peer to  try and get him the Vascepa medication coverage ?His insurance has changed again since that time.  He will be out of the medication very soon.  He has a history of hyperlipidemia, coronary artery disease without angina, and family history of ischemic heart disease ? ?CAD with PCI[facility]stent x 5 [facility] ?Nose bleeds with prior DAPT, ASA stopped and pt on long term plavix per cardiology, also on BB, losartan, statin,  zetia and vascepa with effort to get approved ? ?Anxiety: ?He requests refill of Lexapro he will be out of this soon he has been on Lexapro 10 mg for many years ? ?GERD ?States symptoms are well controlled on Aciphex ? ?Patient Active Problem List  ? Diagnosis Date Noted  ? Pre-diabetes 09/20/2018  ? Hypertension 04/12/2015  ? Dyslipidemia 03/22/2015  ? GERD (gastroesophageal reflux disease) 03/22/2015  ? Asthma 03/22/2015  ? Anxiety 03/22/2015  ? Family history of ischemic heart disease (IHD) 10/24/2012  ? History of fracture of rib 09/04/2011  ? Traumatic pneumothorax 09/04/2011  ? Coronary artery disease involving native coronary artery of native heart without angina pectoris 08/01/2007  ? ? ? ? ?Current Outpatient Medications:  ?  albuterol (VENTOLIN HFA) 108 (90 Base) MCG/ACT inhaler, INHALE 2 PUFFS INTO THE LUNGS EVERY 6 HOURS AS NEEDED FOR WHEEZING OR SHORTNESS OF BREATH, Disp: 8.5 g, Rfl: 0 ?  clopidogrel (PLAVIX) 75 MG tablet, Take 1 tablet (75 mg total) by mouth daily., Disp: 90 tablet, Rfl: 1 ?  escitalopram (LEXAPRO) 10 MG tablet, Take 1 tablet (10 mg total) by mouth daily., Disp: 90 tablet, Rfl: 1 ?  fluticasone furoate-vilanterol (BREO ELLIPTA) 100-25 MCG/INH AEPB, Inhale 1 puff into the lungs daily., Disp: 270 each, Rfl: 1 ?  losartan (COZAAR) 25 MG tablet, Take 25 mg by mouth daily., Disp: , Rfl:  ?  Multiple Vitamins-Minerals (CENTRUM SILVER PO), Take 1 tablet by mouth daily., Disp: , Rfl:  ?  nebivolol (BYSTOLIC) 5 MG tablet, Take 1 tablet (5 mg total) by mouth daily. Future refill request will need to be sent to new Cardiologist, Disp: 30 tablet, Rfl: 0 ?  predniSONE (DELTASONE) 20 MG tablet, Take 2 tablets (40 mg total) by mouth daily with breakfast., Disp: 10 tablet, Rfl: 0 ?  promethazine-dextromethorphan (PROMETHAZINE-DM) 6.25-15 MG/5ML syrup, Take 5 mLs by mouth 3 (three) times daily as needed for cough., Disp: 180 mL, Rfl: 0 ?  RABEprazole (ACIPHEX) 20 MG tablet, Take 1 tablet (20 mg total)  by mouth daily., Disp: 90 tablet, Rfl: 1 ?  rosuvastatin (CRESTOR) 40 MG tablet, Take 1 tablet (40 mg total) by mouth daily., Disp: 90 tablet, Rfl: 1 ?  valACYclovir (VALTREX) 1000 MG tablet, Take 1 tablet (1,000 mg total) by mouth 3 (three) times daily., Disp: 30 tablet, Rfl: 0 ?  VASCEPA 1 g capsule, TAKE 2 CAPSULES (2 G TOTAL) BY MOUTH 2 (TWO) TIMES DAILY., Disp: 120 capsule, Rfl: 0 ?  ezetimibe (ZETIA) 10 MG tablet, Take 1 tablet (10 mg total) by mouth daily., Disp: 90 tablet, Rfl: 0 ?  losartan (COZAAR) 50 MG tablet, TAKE 1 TABLET BY MOUTH EVERY DAY (Patient not taking: Reported on 01/18/2022), Disp: 30 tablet, Rfl: 0 ? ? ?Allergies  ?Allergen Reactions  ? Molds & Smuts Shortness Of Breath  ? Fish Oil Hives  ? Peanut Oil   ? Tetracyclines & Related Other (See Comments)  ?  Intolerance - stomach upset  ? ? ? ?Social History  ? ?Tobacco Use  ?  Smoking status: Never  ? Smokeless tobacco: Never  ?Vaping Use  ? Vaping Use: Never used  ?Substance Use Topics  ? Alcohol use: Yes  ?  Alcohol/week: 0.0 standard drinks  ?  Comment: occasional wine  ? Drug use: No  ?  ? ? ?Chart Review Today: ?I personally reviewed active problem list, medication list, allergies, family history, social history, health maintenance, notes from last encounter, lab results, imaging with the patient/caregiver today. ?Extensive chart review today of [facility] charts including PCP and cardiology, UC visit, xrays, meds, recent labs ? ?Review of Systems  ?Constitutional: Negative.  Negative for appetite change, chills, fever and weight loss.  ?HENT:  Positive for congestion, postnasal drip, rhinorrhea and sore throat (scratchy). Negative for ear pain, sinus pressure, sinus pain, tinnitus, trouble swallowing and voice change.   ?Eyes: Negative.   ?Respiratory:  Positive for cough. Negative for apnea, hemoptysis, choking, chest tightness, shortness of breath, wheezing and stridor.   ?Cardiovascular: Negative.  Negative for chest pain.  ?Gastrointestinal:  Negative.  Negative for heartburn (well controlled gerd with meds).  ?Endocrine: Negative.   ?Genitourinary: Negative.   ?Musculoskeletal: Negative.  Negative for myalgias.  ?Skin: Negative.  Negative for

## 2022-02-06 NOTE — Progress Notes (Signed)
Name: Paul Page   MRN: [id number]    DOB: 11/22/1955   Date:02/07/2022 ? ?     Progress Note ? ?Subjective ? ?Chief Complaint ? ?Follow Up ? ?HPI ? ? ?Patient changed insurance in 2021 and was seeing [facility] provider but now that he is under Medicare he came in to re-establish care with [place] ? ?HTN: taking medication, denies chest pain or palpitation. BP today is at goal. He is compliant with medication and denies side effects ?  ?GERD: doing well on medication, no heart burn or indigestion. He is taking Aciphex daily, discussed importance of trying to wean self off due to long term use of PPI's ?  ?Asthma: he is taking Breo, denies wheezing or SOB, he states usually worse in the Spring .  He has high allergy to pine ( cleaners/pine needles...) , it triggers his allergies and asthma. He had a flare after a recent episode of bronchitis, but cough has resolved now  ?  ?Hyperlipidemia: taking  Crestor,  Zetia, Vascepa.. He has CAD but denies chest pain or decrease in exercise tolerance.  Denies chest pain or palpitation.  ?  ?CAD: s/p stent placement in 2008 and  last one in 2011 he was  under the care of Dr Kirke Corin but is now seeing Dr Jaymes Graff in [place] . He is on aspirin but not daily  , plavix and statin , vascepa, zetia No pain at this time, normal exercise tolerance. He had myoview and echo done at [facility] 2021 and reviewed tests  ?  ?Hyperglycemia: last hgbA1C was back to normal. He  denies polyphagia, polydipsia or polyuria. He is active at his new job at [employer] ?  ?Anxiety: taking Lexapro, has to take it at night because it makes him feel tired, controlling symptoms, he is feeling well  ?  ?History of CAP and bronchitis:in 2023, he had CXR and we will repeat it today. He took clarithromycin, he is feeling well now.  ? ?Sinusitis: noticing symptoms of weird smell and drainage from right side, with some tooth sensitivity for the past two weeks.  ? ?Patient Active Problem List  ? Diagnosis Date Noted  ? Pre-diabetes  09/20/2018  ? Hypertension 04/12/2015  ? Dyslipidemia 03/22/2015  ? GERD (gastroesophageal reflux disease) 03/22/2015  ? Asthma 03/22/2015  ? Anxiety 03/22/2015  ? Pure hypercholesterolemia 12/10/2012  ? Family history of ischemic heart disease (IHD) 10/24/2012  ? History of fracture of rib 09/04/2011  ? Traumatic pneumothorax 09/04/2011  ? Depressive disorder 09/17/2008  ? Coronary artery disease involving native coronary artery of native heart without angina pectoris 08/01/2007  ? ? ?Past Surgical History:  ?Procedure Laterality Date  ? ANKLE FRACTURE SURGERY    ? CARDIAC CATHETERIZATION  2011  ? CARDIAC CATHETERIZATION  2008  ? CORONARY STENT PLACEMENT  2008 & 2011  ? ? ?Family History  ?Problem Relation Age of Onset  ? Parkinson's disease Mother   ? Cancer Father   ? ? ?Social History  ? ?Tobacco Use  ? Smoking status: Never  ? Smokeless tobacco: Never  ?Substance Use Topics  ? Alcohol use: Yes  ?  Alcohol/week: 0.0 standard drinks  ?  Comment: occasional wine  ? ? ? ?Current Outpatient Medications:  ?  albuterol (VENTOLIN HFA) 108 (90 Base) MCG/ACT inhaler, INHALE 2 PUFFS INTO THE LUNGS EVERY 6 HOURS AS NEEDED FOR WHEEZING OR SHORTNESS OF BREATH, Disp: 8.5 g, Rfl: 0 ?  clopidogrel (PLAVIX) 75 MG tablet, Take 1 tablet (75  mg total) by mouth daily., Disp: 90 tablet, Rfl: 1 ?  escitalopram (LEXAPRO) 10 MG tablet, Take 1 tablet (10 mg total) by mouth daily., Disp: 90 tablet, Rfl: 1 ?  icosapent Ethyl (VASCEPA) 1 g capsule, Take 2 capsules (2 g total) by mouth 2 (two) times daily., Disp: 480 capsule, Rfl: 1 ?  ipratropium-albuterol (DUONEB) 0.5-2.5 (3) MG/3ML SOLN, Take 3 mLs by nebulization every 6 (six) hours as needed (coughing fits, bronchitis following CAP with hx of asthma)., Disp: 90 mL, Rfl: 1 ?  Multiple Vitamins-Minerals (CENTRUM SILVER PO), Take 1 tablet by mouth daily., Disp: , Rfl:  ?  nebivolol (BYSTOLIC) 5 MG tablet, Take 1 tablet (5 mg total) by mouth daily. Future refill request will need to be sent  to new Cardiologist, Disp: 30 tablet, Rfl: 0 ?  RABEprazole (ACIPHEX) 20 MG tablet, Take 1 tablet (20 mg total) by mouth daily., Disp: 90 tablet, Rfl: 1 ?  rosuvastatin (CRESTOR) 40 MG tablet, Take 1 tablet (40 mg total) by mouth daily., Disp: 90 tablet, Rfl: 1 ?  valACYclovir (VALTREX) 1000 MG tablet, Take 1 tablet (1,000 mg total) by mouth 3 (three) times daily., Disp: 30 tablet, Rfl: 0 ?  ezetimibe (ZETIA) 10 MG tablet, Take 1 tablet (10 mg total) by mouth daily., Disp: 90 tablet, Rfl: 0 ? ?Allergies  ?Allergen Reactions  ? Molds & Smuts Shortness Of Breath  ? Ambrosia Trifida (Tall Ragweed) Allergy Skin Test   ? Fish Oil Hives  ? Gramineae Pollens   ? Peanut Oil   ? Tetracyclines & Related Other (See Comments)  ?  Intolerance - stomach upset  ? ? ?I personally reviewed active problem list, medication list, allergies, family history, social history, health maintenance with the patient/caregiver today. ? ? ?ROS ? ?Constitutional: Negative for fever or weight change.  ?Respiratory: Negative for cough and shortness of breath.   ?Cardiovascular: Negative for chest pain or palpitations.  ?Gastrointestinal: Negative for abdominal pain, no bowel changes.  ?Musculoskeletal: Negative for gait problem or joint swelling.  ?Skin: Negative for rash.  ?Neurological: Negative for dizziness or headache.  ?No other specific complaints in a complete review of systems (except as listed in HPI above).  ? ?Objective ? ?Vitals:  ? 02/07/22 1045  ?BP: 128/82  ?Pulse: 65  ?Resp: 16  ?SpO2: 95%  ?Weight: 207 lb (93.9 kg)  ?Height: 5\' 10"  (1.778 m)  ? ? ?Body mass index is 29.7 kg/m?. ? ?Physical Exam ? ?Constitutional: Patient appears well-developed and well-nourished. Overweight.  No distress.  ?HEENT: head atraumatic, normocephalic, pupils equal and reactive to light, neck supple, mild white postnasal drainage ?Cardiovascular: Normal rate, regular rhythm and normal heart sounds.  No murmur heard. No BLE edema. ?Pulmonary/Chest: Effort  normal and breath sounds normal. No respiratory distress. ?Abdominal: Soft.  There is no tenderness. ?Psychiatric: Patient has a normal mood and affect. behavior is normal. Judgment and thought content normal.  ? ?Recent Results (from the past 2160 hour(s))  ?Novel Coronavirus, NAA ([facility])     Status: None  ? Collection Time: 12/31/21 11:14 AM  ? Specimen: Nasopharyngeal(NP) swabs in vial transport medium  ? Nasopharynge  ?Result Value Ref Range  ? SARS-CoV-2, NAA Not Detected Not Detected  ?  Comment: This nucleic acid amplification test was developed and its performance ?characteristics determined by 03/02/22. Nucleic acid ?amplification tests include RT-PCR and TMA. This test has not been ?FDA cleared or approved. This test has been authorized by FDA under ?an Emergency Use Authorization (  EUA). This test is only authorized ?for the duration of time the declaration that circumstances exist ?justifying the authorization of the emergency use of in vitro ?diagnostic tests for detection of SARS-CoV-2 virus and/or diagnosis ?of COVID-19 infection under section 564(b)(1) of the Act, 21 U.S.C. ?360bbb-3(b) (1), unless the authorization is terminated or revoked ?sooner. ?When diagnostic testing is negative, the possibility of a false ?negative result should be considered in the context of a patient's ?recent exposures and the presence of clinical signs and symptoms ?consistent with COVID-19. An individual without symptoms of COVID-19 ?and who is not shedding SARS-CoV-2 virus wo uld expect to have a ?negative (not detected) result in this assay. ?  ? ? ?PHQ2/9: ? ?  02/07/2022  ? 10:44 AM 01/18/2022  ?  3:47 PM 09/22/2019  ?  9:37 AM 07/25/2019  ?  4:34 PM 03/27/2019  ?  7:58 AM  ?Depression screen PHQ 2/9  ?Decreased Interest 0 0 0 0 0  ?Down, Depressed, Hopeless 0 0 0 0 0  ?PHQ - 2 Score 0 0 0 0 0  ?Altered sleeping 0 0 0 0 0  ?Tired, decreased energy 0 0 0 0 0  ?Change in appetite 0 0 0 0 0  ?Feeling bad or  failure about yourself  0 0 0 0 0  ?Trouble concentrating 0 0 0 0 0  ?Moving slowly or fidgety/restless 0 0 0 0 0  ?Suicidal thoughts 0 0 0 0 0  ?PHQ-9 Score 0 0 0 0 0  ?Difficult doing work/chores  Not

## 2022-02-07 ENCOUNTER — Encounter: Payer: Self-pay | Admitting: Family Medicine

## 2022-02-07 ENCOUNTER — Ambulatory Visit (INDEPENDENT_AMBULATORY_CARE_PROVIDER_SITE_OTHER): Payer: Medicare Other | Admitting: Family Medicine

## 2022-02-07 VITALS — BP 128/82 | HR 65 | Resp 16 | Ht 70.0 in | Wt 207.0 lb

## 2022-02-07 DIAGNOSIS — Z23 Encounter for immunization: Secondary | ICD-10-CM

## 2022-02-07 DIAGNOSIS — K219 Gastro-esophageal reflux disease without esophagitis: Secondary | ICD-10-CM

## 2022-02-07 DIAGNOSIS — J3089 Other allergic rhinitis: Secondary | ICD-10-CM

## 2022-02-07 DIAGNOSIS — I2510 Atherosclerotic heart disease of native coronary artery without angina pectoris: Secondary | ICD-10-CM

## 2022-02-07 DIAGNOSIS — I10 Essential (primary) hypertension: Secondary | ICD-10-CM | POA: Diagnosis not present

## 2022-02-07 DIAGNOSIS — Z955 Presence of coronary angioplasty implant and graft: Secondary | ICD-10-CM

## 2022-02-07 DIAGNOSIS — Z8619 Personal history of other infectious and parasitic diseases: Secondary | ICD-10-CM

## 2022-02-07 DIAGNOSIS — Z79899 Other long term (current) drug therapy: Secondary | ICD-10-CM

## 2022-02-07 DIAGNOSIS — J0100 Acute maxillary sinusitis, unspecified: Secondary | ICD-10-CM

## 2022-02-07 DIAGNOSIS — E785 Hyperlipidemia, unspecified: Secondary | ICD-10-CM

## 2022-02-07 DIAGNOSIS — B001 Herpesviral vesicular dermatitis: Secondary | ICD-10-CM

## 2022-02-07 DIAGNOSIS — R9389 Abnormal findings on diagnostic imaging of other specified body structures: Secondary | ICD-10-CM | POA: Diagnosis not present

## 2022-02-07 DIAGNOSIS — J302 Other seasonal allergic rhinitis: Secondary | ICD-10-CM

## 2022-02-07 DIAGNOSIS — J4540 Moderate persistent asthma, uncomplicated: Secondary | ICD-10-CM | POA: Insufficient documentation

## 2022-02-07 DIAGNOSIS — F419 Anxiety disorder, unspecified: Secondary | ICD-10-CM

## 2022-02-07 DIAGNOSIS — Z8701 Personal history of pneumonia (recurrent): Secondary | ICD-10-CM

## 2022-02-07 MED ORDER — RABEPRAZOLE SODIUM 20 MG PO TBEC: 20.0000 mg | DELAYED_RELEASE_TABLET | Freq: Every day | ORAL | 1 refills | Status: DC

## 2022-02-07 MED ORDER — AZELASTINE HCL 0.1 % NA SOLN: 1.0000 | Freq: Two times a day (BID) | NASAL | 0 refills | Status: DC

## 2022-02-07 MED ORDER — NEBIVOLOL HCL 5 MG PO TABS: 5.0000 mg | ORAL_TABLET | Freq: Every day | ORAL | 1 refills | Status: DC

## 2022-02-07 MED ORDER — ICOSAPENT ETHYL 1 G PO CAPS: 2.0000 g | ORAL_CAPSULE | Freq: Two times a day (BID) | ORAL | 1 refills | Status: DC

## 2022-02-07 MED ORDER — ROSUVASTATIN CALCIUM 40 MG PO TABS: 40.0000 mg | ORAL_TABLET | Freq: Every day | ORAL | 1 refills | Status: DC

## 2022-02-07 MED ORDER — EZETIMIBE 10 MG PO TABS: 10.0000 mg | ORAL_TABLET | Freq: Every day | ORAL | 1 refills | Status: DC

## 2022-02-07 MED ORDER — ALBUTEROL SULFATE HFA 108 (90 BASE) MCG/ACT IN AERS: 2.0000 | INHALATION_SPRAY | Freq: Four times a day (QID) | RESPIRATORY_TRACT | 0 refills | Status: DC | PRN

## 2022-02-07 MED ORDER — LOSARTAN POTASSIUM 25 MG PO TABS: 25.0000 mg | ORAL_TABLET | Freq: Every day | ORAL | 1 refills | Status: DC

## 2022-02-07 MED ORDER — VALACYCLOVIR HCL 1 G PO TABS: 1000.0000 mg | ORAL_TABLET | Freq: Three times a day (TID) | ORAL | 0 refills | Status: DC

## 2022-02-07 MED ORDER — AZITHROMYCIN 500 MG PO TABS: 500.0000 mg | ORAL_TABLET | Freq: Every day | ORAL | 0 refills | Status: DC

## 2022-02-07 MED ORDER — CLOPIDOGREL BISULFATE 75 MG PO TABS: 75.0000 mg | ORAL_TABLET | Freq: Every day | ORAL | 1 refills | Status: DC

## 2022-02-07 NOTE — Assessment & Plan Note (Signed)
Continue zetia, vascepa and crestor, LDL goal below 70  ?

## 2022-02-07 NOTE — Assessment & Plan Note (Signed)
Needs refills of valtrex  ?

## 2022-02-07 NOTE — Assessment & Plan Note (Signed)
Discussed trying to skip doses to decrease risk of side effects ?

## 2022-02-07 NOTE — Assessment & Plan Note (Signed)
He has been taking lexapro and states symptoms of anxiety is controlled  ?

## 2022-02-07 NOTE — Assessment & Plan Note (Signed)
Well controlled with medications

## 2022-02-07 NOTE — Assessment & Plan Note (Signed)
I will recheck CXR  ?

## 2022-02-07 NOTE — Assessment & Plan Note (Signed)
Under the care of cardiologist at [facility] ?History of stents in 2008 and 2011  ?Had myoview, echo done and reviewed  ?

## 2022-02-07 NOTE — Assessment & Plan Note (Signed)
Controlled with medication

## 2022-02-08 LAB — COMPLETE METABOLIC PANEL WITH GFR
AG Ratio: 1.6 (calc) (ref 1.0–2.5)
ALT: 33 U/L (ref 9–46)
AST: 34 U/L (ref 10–35)
Albumin: 4.2 g/dL (ref 3.6–5.1)
Alkaline phosphatase (APISO): 57 U/L (ref 35–144)
BUN: 17 mg/dL (ref 7–25)
CO2: 27 mmol/L (ref 20–32)
Calcium: 9.2 mg/dL (ref 8.6–10.3)
Chloride: 105 mmol/L (ref 98–110)
Creat: 0.96 mg/dL (ref 0.70–1.35)
Globulin: 2.7 g/dL (calc) (ref 1.9–3.7)
Glucose, Bld: 89 mg/dL (ref 65–99)
Potassium: 4.6 mmol/L (ref 3.5–5.3)
Sodium: 140 mmol/L (ref 135–146)
Total Bilirubin: 0.7 mg/dL (ref 0.2–1.2)
Total Protein: 6.9 g/dL (ref 6.1–8.1)
eGFR: 88 mL/min/{1.73_m2} (ref >=60)

## 2022-02-08 LAB — CBC WITH DIFFERENTIAL/PLATELET
Absolute Monocytes: 523 cells/uL (ref 200–950)
Basophils Absolute: 50 cells/uL (ref 0–200)
Basophils Relative: 0.9 %
Eosinophils Absolute: 121 cells/uL (ref 15–500)
Eosinophils Relative: 2.2 %
HCT: 40.4 % (ref 38.5–50.0)
Hemoglobin: 13.7 g/dL (ref 13.2–17.1)
Lymphs Abs: 1084 cells/uL (ref 850–3900)
MCH: 32.2 pg (ref 27.0–33.0)
MCHC: 33.9 g/dL (ref 32.0–36.0)
MCV: 94.8 fL (ref 80.0–100.0)
MPV: 9.8 fL (ref 7.5–12.5)
Monocytes Relative: 9.5 %
Neutro Abs: 3724 cells/uL (ref 1500–7800)
Neutrophils Relative %: 67.7 %
Platelets: 250 10*3/uL (ref 140–400)
RBC: 4.26 10*6/uL (ref 4.20–5.80)
RDW: 12.5 % (ref 11.0–15.0)
Total Lymphocyte: 19.7 %
WBC: 5.5 10*3/uL (ref 3.8–10.8)

## 2022-02-08 LAB — LIPID PANEL
Cholesterol: 129 mg/dL (ref <200)
HDL: 63 mg/dL (ref >=40)
LDL Cholesterol (Calc): 48 mg/dL (calc)
Non-HDL Cholesterol (Calc): 66 mg/dL (calc) (ref <130)
Total CHOL/HDL Ratio: 2 (calc) (ref <5.0)
Triglycerides: 101 mg/dL (ref <150)

## 2022-03-06 ENCOUNTER — Telehealth: Payer: Self-pay

## 2022-03-06 ENCOUNTER — Other Ambulatory Visit: Payer: Self-pay | Admitting: Family Medicine

## 2022-03-06 DIAGNOSIS — J4540 Moderate persistent asthma, uncomplicated: Secondary | ICD-10-CM

## 2022-03-06 NOTE — Telephone Encounter (Signed)
Spoke to pts wife and he stresses he does not want an appt due to the documentation about him needing an updated xray within 8 weeks of his last one    Copied from CRM [id number]. Topic: General - Other >> Mar 06, 2022  1:07 PM Eliseo Gum, [place] wrote: Reason for CRM:pt's wife Darel Hong called in to get orders sent for a chest xray, was tld to get one since he was seen for pnuemonia.

## 2022-03-06 NOTE — Telephone Encounter (Signed)
Order had already been placed previously by Dr. Ancil Boozer. Made wife aware he can get it done anytime.

## 2022-03-09 ENCOUNTER — Ambulatory Visit
Admission: RE | Admit: 2022-03-09 | Discharge: 2022-03-09 | Disposition: A | Discharge status: Home / Self Care | Payer: Medicare Other | Source: Ambulatory Visit | Attending: Family Medicine | Admitting: Family Medicine

## 2022-03-09 ENCOUNTER — Encounter: Payer: Self-pay | Admitting: Family Medicine

## 2022-03-09 ENCOUNTER — Ambulatory Visit
Admission: RE | Admit: 2022-03-09 | Discharge: 2022-03-09 | Disposition: A | Discharge status: Home / Self Care | Payer: Medicare Other | Attending: Family Medicine | Admitting: Family Medicine

## 2022-03-09 DIAGNOSIS — Z8701 Personal history of pneumonia (recurrent): Secondary | ICD-10-CM | POA: Diagnosis present

## 2022-03-09 DIAGNOSIS — R9389 Abnormal findings on diagnostic imaging of other specified body structures: Secondary | ICD-10-CM | POA: Insufficient documentation

## 2022-04-19 IMAGING — DX DG CHEST 2V
2 series · 2 of 2 positions shown · non-contrast
Comparison: None.

CLINICAL DATA: Cough and shortness of breath.

EXAM:
CHEST - 2 VIEW

[chest pa]
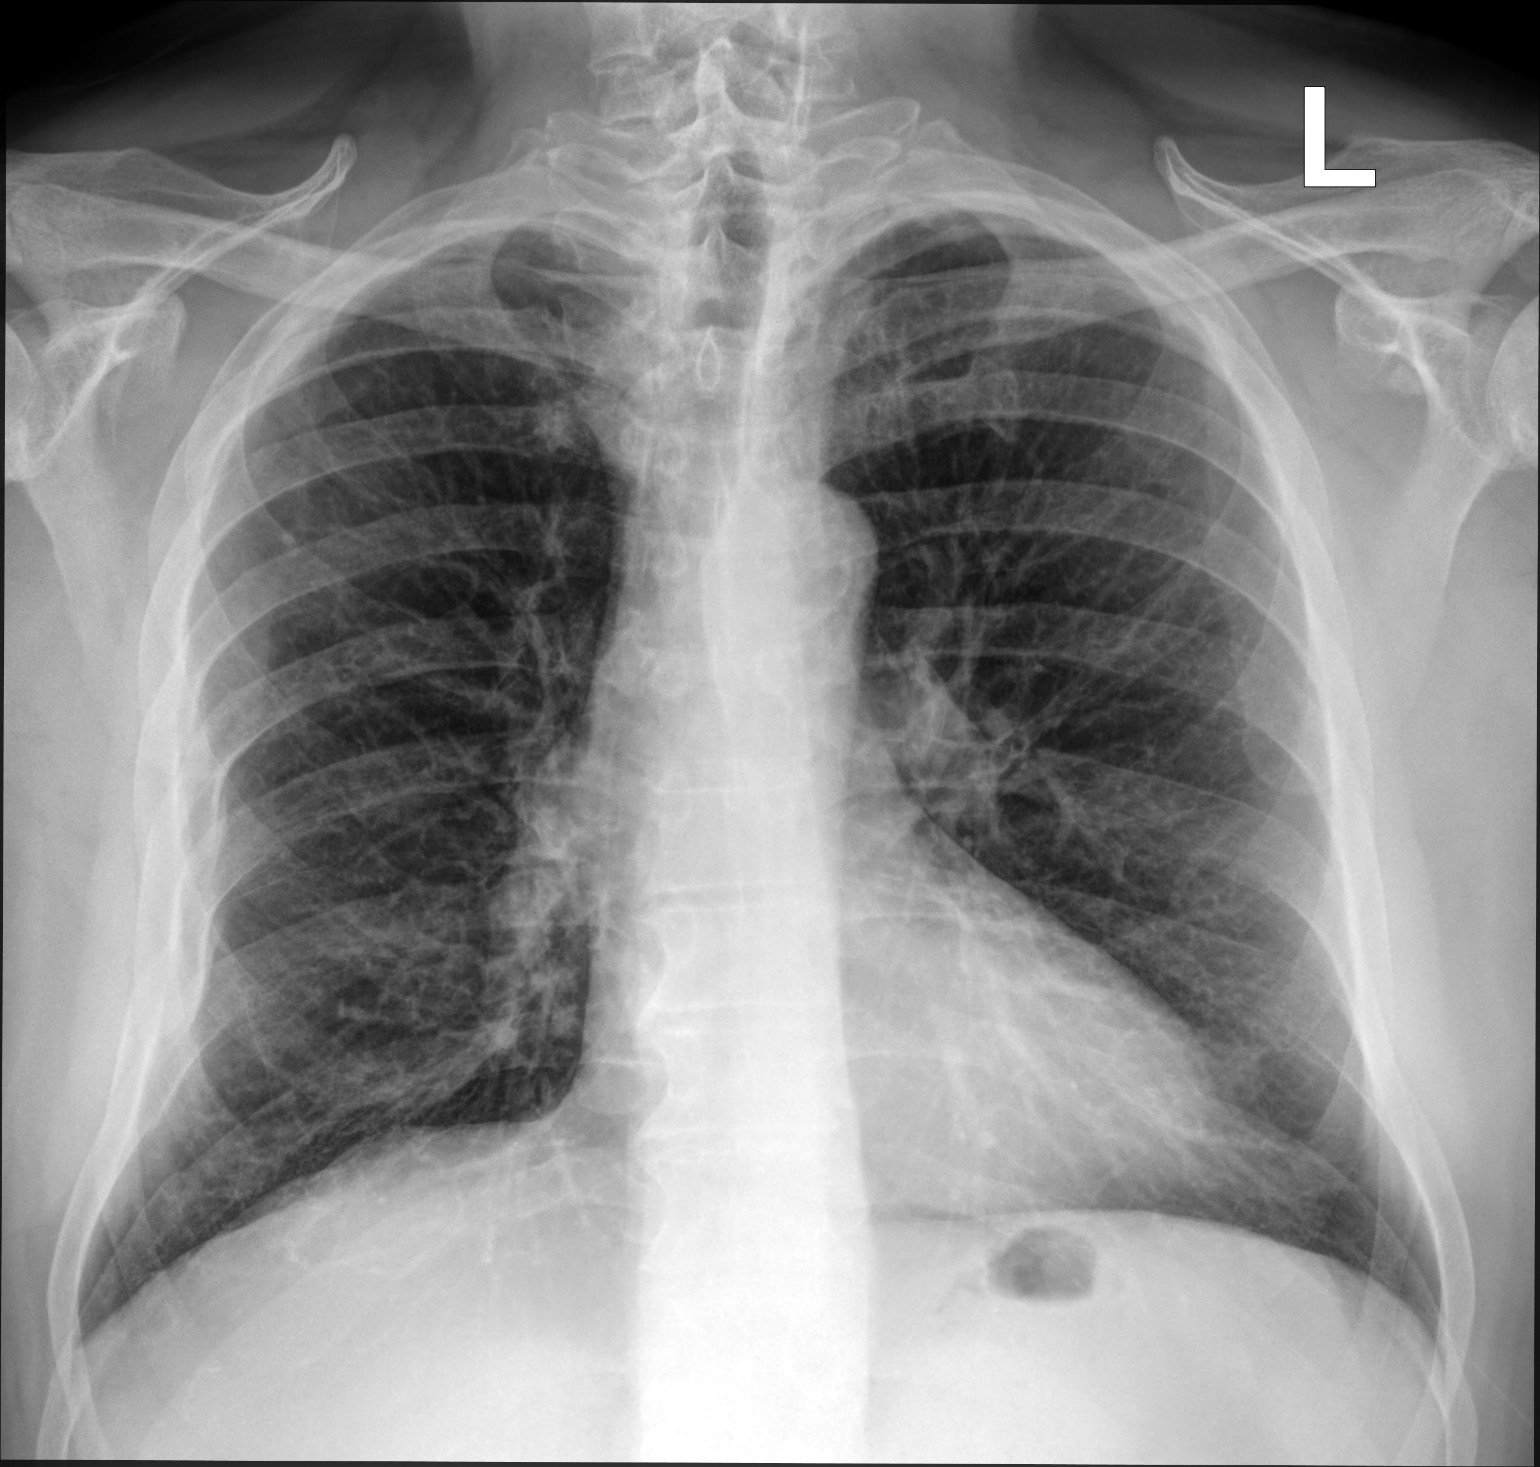

[chest lat]
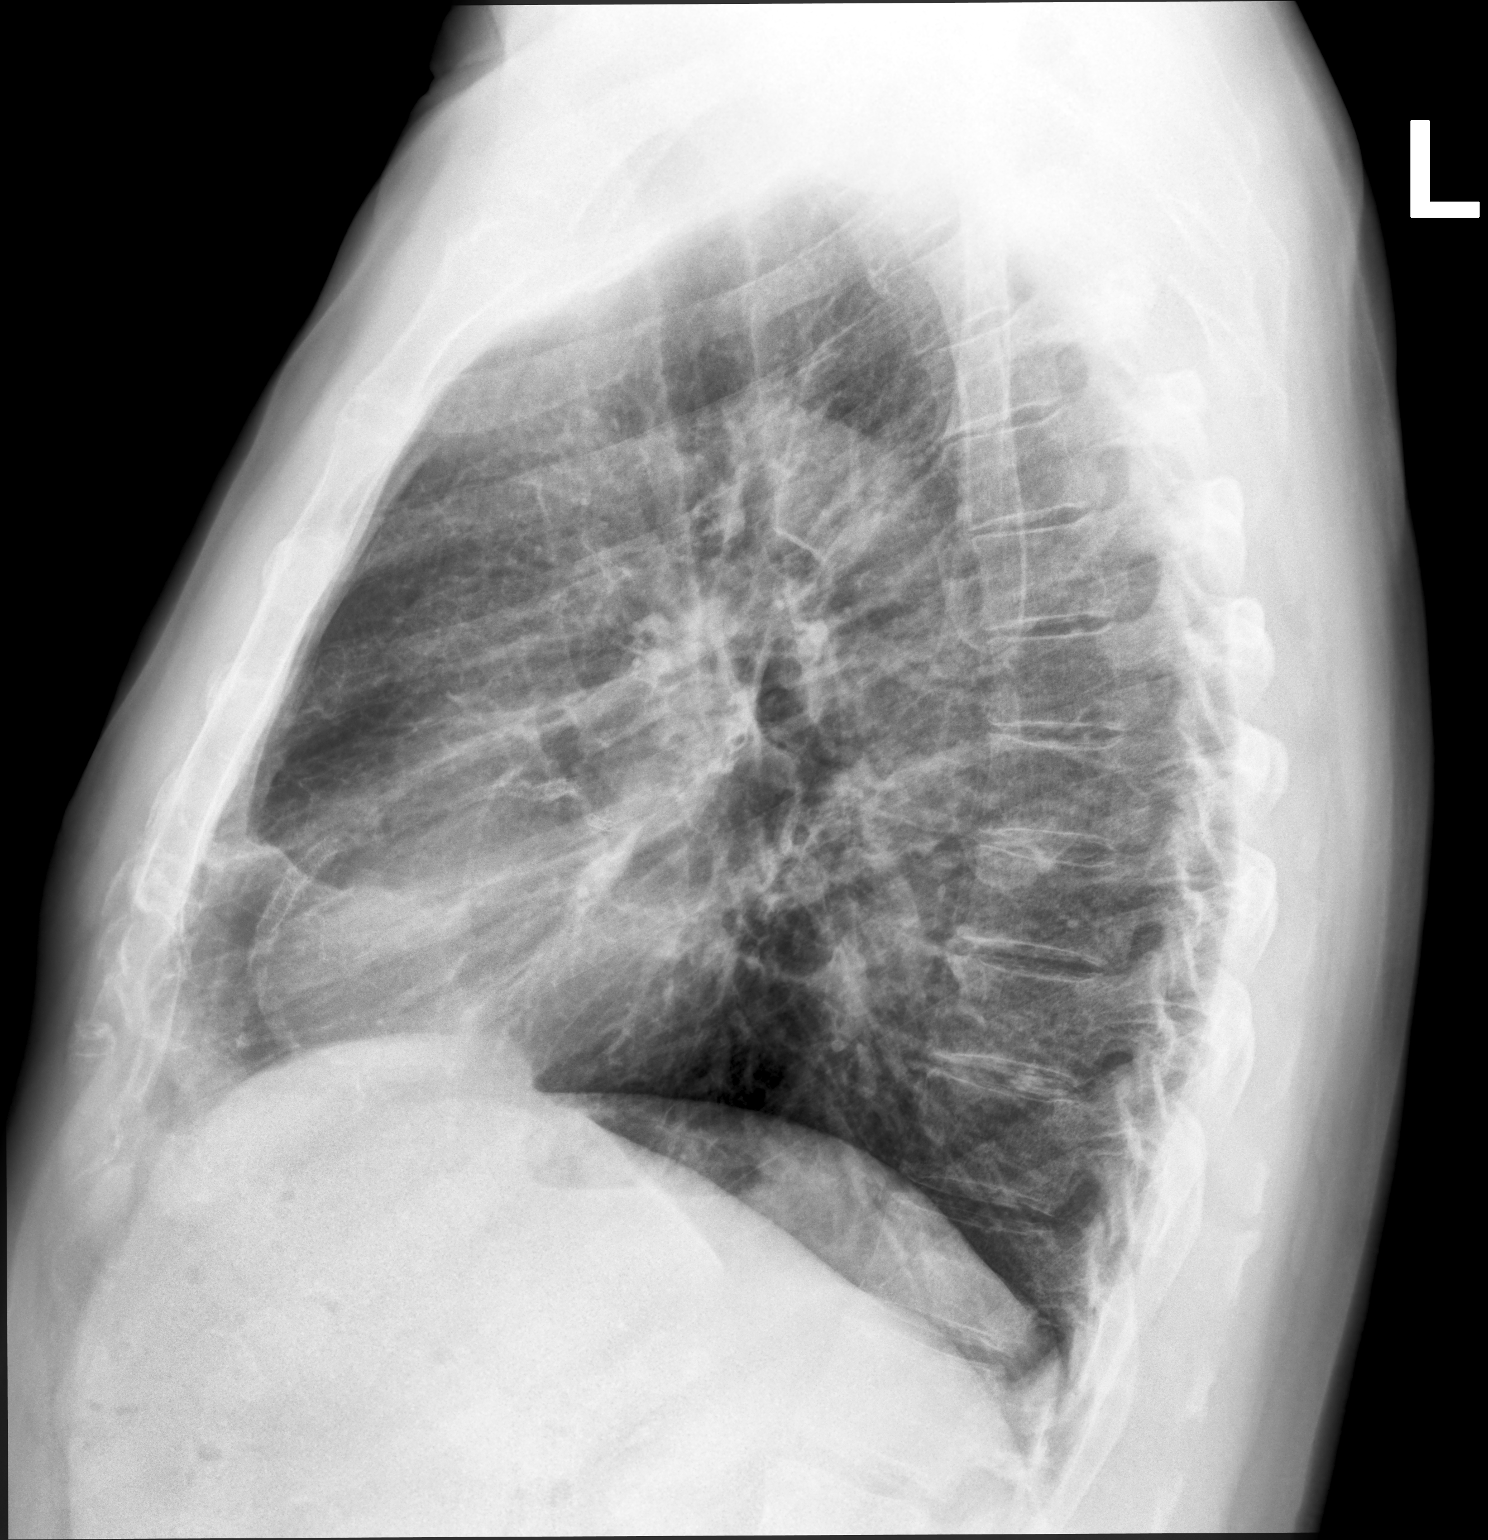

[2 of 2 positions shown; findings below may reference images not displayed]

FINDINGS: No pneumothorax. Mild deformity of the lateral right chest wall,
likely healed rib fractures. No pulmonary nodules or masses. Mild
opacity projected over the heart on the lateral view. No edema. The
cardiomediastinal silhouette is unremarkable.
IMPRESSION: 1. Mild opacity projected over the heart on the lateral view may
represent infiltrate or confluence of shadows. Recommend short-term
follow-up imaging to ensure resolution.
2. No other acute abnormalities. Apparent healed fractures on the
right.

## 2022-06-26 IMAGING — CR DG CHEST 2V
1 series · 2 of 2 positions shown · non-contrast
Comparison: Chest radiograph 12/31/2021.

CLINICAL DATA: Abnormal chest radiograph on 12/31/2021. Repeat
examination. History of pneumonia.

EXAM:
CHEST - 2 VIEW

[Series 1: dg chest 2 view · 0.14mm/px · 2 of 2 slices shown]
[im 1/2]
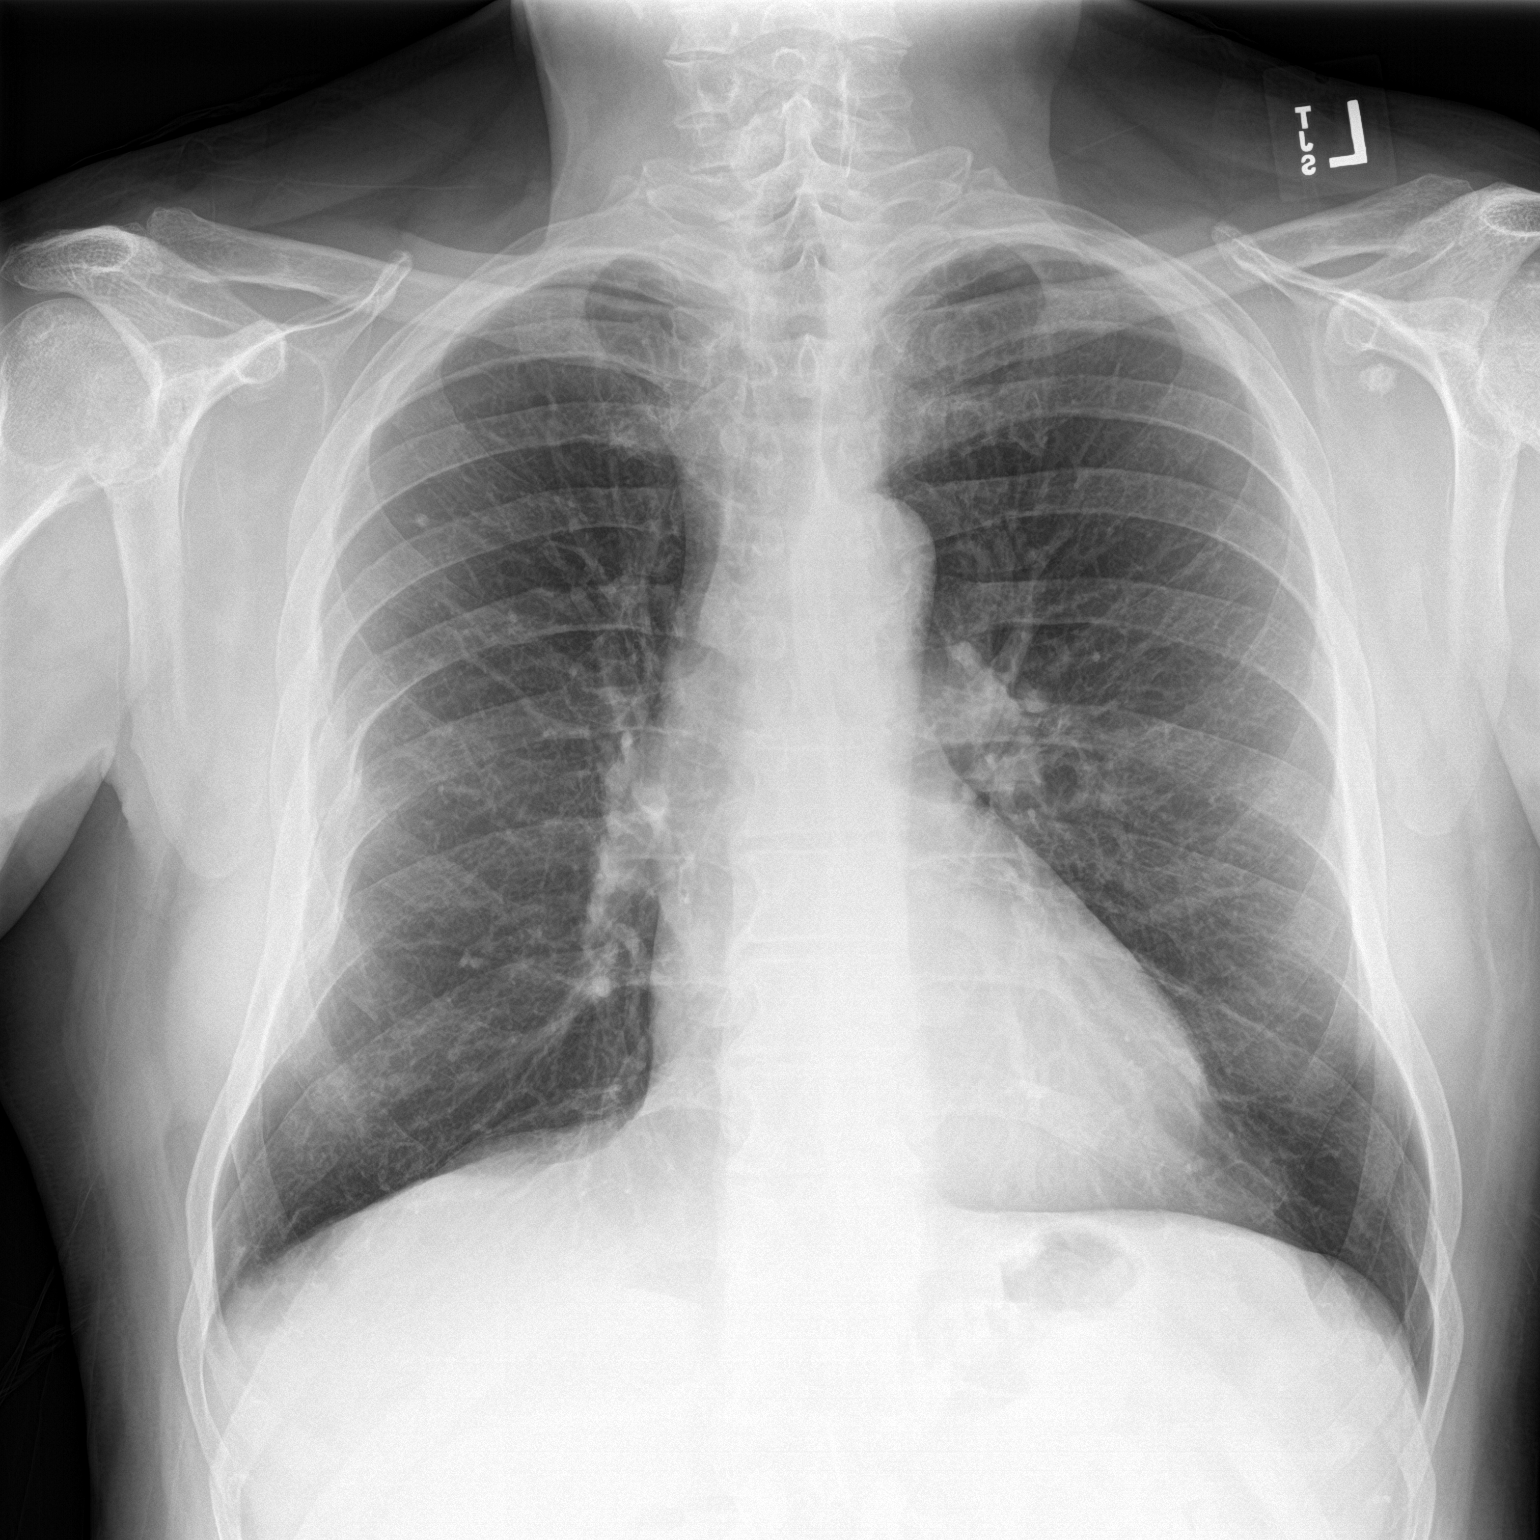
[im 2/2]
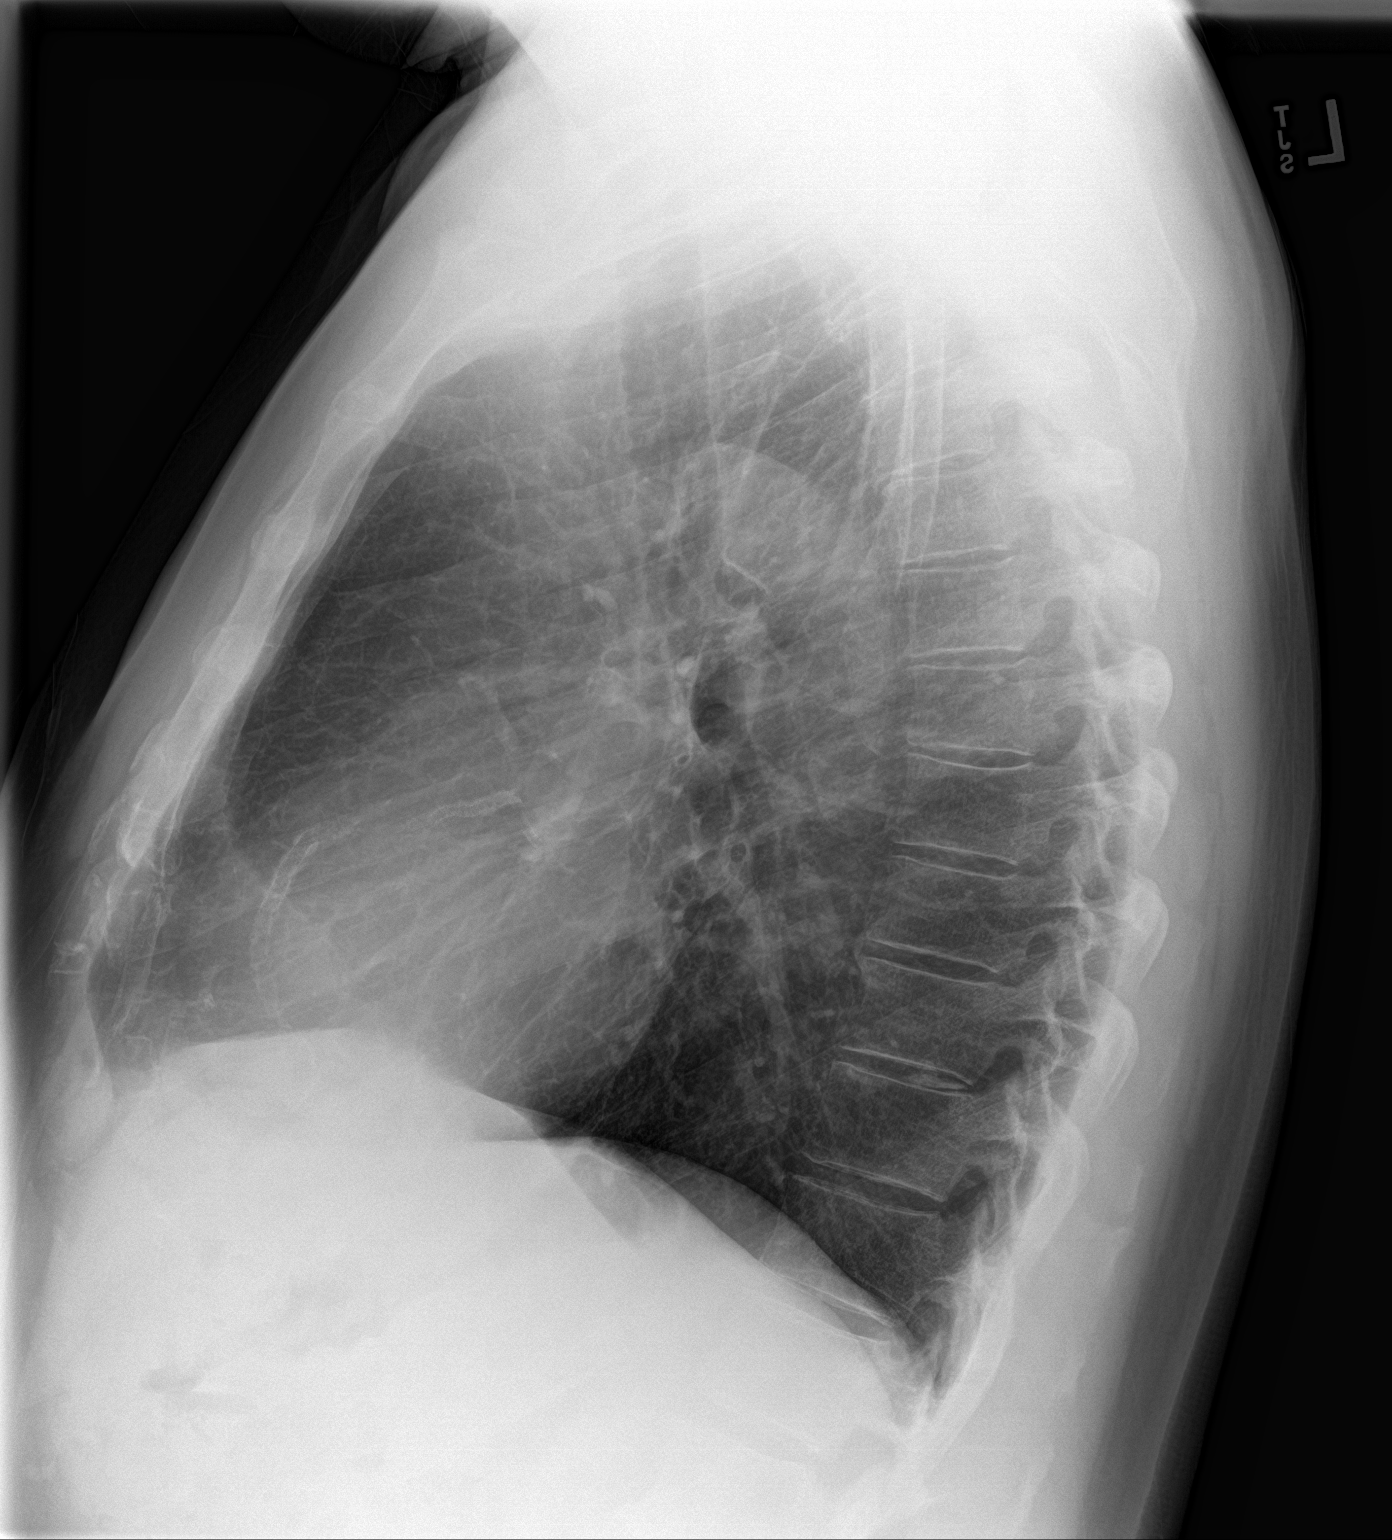

[2 of 2 positions shown; findings below may reference images not displayed]

FINDINGS: There was an opacity overlying the anterior lower chest on the
lateral view. This opacity has resolved. Evidence for coronary
artery calcifications and stents. Both lungs are clear without
pulmonary edema. Old right rib fractures. Heart size is normal. The
trachea is midline. Well-defined nodular density in the right upper
lung probably represents a small calcified granuloma. No pleural
effusions. No acute bone abnormality.
IMPRESSION: 1. No acute cardiopulmonary disease.
2. The opacity in the anterior lower chest on the lateral view has
resolved.

## 2022-08-02 ENCOUNTER — Other Ambulatory Visit: Payer: Self-pay | Admitting: Family Medicine

## 2022-08-02 DIAGNOSIS — I2510 Atherosclerotic heart disease of native coronary artery without angina pectoris: Secondary | ICD-10-CM

## 2022-08-02 NOTE — Telephone Encounter (Signed)
Has appt 1/17.

## 2022-08-08 ENCOUNTER — Ambulatory Visit: Payer: Medicare Other | Admitting: Family Medicine

## 2022-08-11 ENCOUNTER — Encounter: Payer: Self-pay | Admitting: Family Medicine

## 2022-08-11 ENCOUNTER — Ambulatory Visit (INDEPENDENT_AMBULATORY_CARE_PROVIDER_SITE_OTHER): Payer: Medicare Other | Admitting: Family Medicine

## 2022-08-11 VITALS — BP 132/74 | HR 67 | Temp 98.4°F | Resp 16 | Ht 70.0 in | Wt 200.0 lb

## 2022-08-11 DIAGNOSIS — R7303 Prediabetes: Secondary | ICD-10-CM

## 2022-08-11 DIAGNOSIS — F419 Anxiety disorder, unspecified: Secondary | ICD-10-CM | POA: Diagnosis not present

## 2022-08-11 DIAGNOSIS — Z5181 Encounter for therapeutic drug level monitoring: Secondary | ICD-10-CM

## 2022-08-11 DIAGNOSIS — E7800 Pure hypercholesterolemia, unspecified: Secondary | ICD-10-CM

## 2022-08-11 DIAGNOSIS — Z114 Encounter for screening for human immunodeficiency virus [HIV]: Secondary | ICD-10-CM

## 2022-08-11 DIAGNOSIS — I10 Essential (primary) hypertension: Secondary | ICD-10-CM | POA: Diagnosis not present

## 2022-08-11 DIAGNOSIS — E785 Hyperlipidemia, unspecified: Secondary | ICD-10-CM

## 2022-08-11 DIAGNOSIS — J4540 Moderate persistent asthma, uncomplicated: Secondary | ICD-10-CM

## 2022-08-11 DIAGNOSIS — K219 Gastro-esophageal reflux disease without esophagitis: Secondary | ICD-10-CM | POA: Diagnosis not present

## 2022-08-11 DIAGNOSIS — I2510 Atherosclerotic heart disease of native coronary artery without angina pectoris: Secondary | ICD-10-CM

## 2022-08-11 DIAGNOSIS — M79674 Pain in right toe(s): Secondary | ICD-10-CM

## 2022-08-11 DIAGNOSIS — Z7902 Long term (current) use of antithrombotics/antiplatelets: Secondary | ICD-10-CM

## 2022-08-11 MED ORDER — ESCITALOPRAM OXALATE 10 MG PO TABS: 10.0000 mg | ORAL_TABLET | Freq: Every day | ORAL | 1 refills | Status: DC

## 2022-08-11 MED ORDER — LOSARTAN POTASSIUM 25 MG PO TABS
50.0000 mg | ORAL_TABLET | Freq: Every day | ORAL | 1 refills | Status: DC
Start: 2022-08-11 — End: 2022-08-11

## 2022-08-11 MED ORDER — LOSARTAN POTASSIUM 50 MG PO TABS: 50.0000 mg | ORAL_TABLET | Freq: Every day | ORAL | 1 refills | Status: DC

## 2022-08-11 MED ORDER — ROSUVASTATIN CALCIUM 40 MG PO TABS: 40.0000 mg | ORAL_TABLET | Freq: Every day | ORAL | 1 refills | Status: DC

## 2022-08-11 MED ORDER — FLUTICASONE FUROATE-VILANTEROL 100-25 MCG/ACT IN AEPB: 1.0000 | INHALATION_SPRAY | Freq: Every day | RESPIRATORY_TRACT | 3 refills | Status: DC

## 2022-08-11 MED ORDER — NEBIVOLOL HCL 5 MG PO TABS: 5.0000 mg | ORAL_TABLET | Freq: Every day | ORAL | 1 refills | Status: DC

## 2022-08-11 MED ORDER — CLOPIDOGREL BISULFATE 75 MG PO TABS: 75.0000 mg | ORAL_TABLET | Freq: Every day | ORAL | 1 refills | Status: DC

## 2022-08-11 MED ORDER — EZETIMIBE 10 MG PO TABS: 10.0000 mg | ORAL_TABLET | Freq: Every day | ORAL | 1 refills | Status: DC

## 2022-08-11 MED ORDER — RABEPRAZOLE SODIUM 20 MG PO TBEC: 20.0000 mg | DELAYED_RELEASE_TABLET | Freq: Every day | ORAL | 1 refills | Status: DC

## 2022-08-11 NOTE — Assessment & Plan Note (Signed)
Longstanding severe acid reflux, patient is unable to wean off of PPI medication His father died of esophageal cancer He denies any abdominal pain, dysphagia, unintentional weight loss He has never been to a gastroenterologist for his acid reflux or an upper endoscopy he agrees to seeing GI for f/up on GERD and PPI dependence He was advised previously by his PCP of the same but had not gone to GI, he was advised to try to wean off of the PPI but if he skips a day he has severe epigastric burning and indigestion

## 2022-08-11 NOTE — Assessment & Plan Note (Signed)
On Zetia, Crestor and Vascepa, labs recently done per cardiology patient states, reviewed through care everywhere today

## 2022-08-11 NOTE — Progress Notes (Signed)
Name: Paul Page   MRN: [id number]    DOB: January 22, 1956   Date:08/11/2022       Progress Note  Chief Complaint  Patient presents with   Follow-up   Hypertension   Hyperlipidemia   Gastroesophageal Reflux   Anxiety     Subjective:   Paul Page is a 66 y.o. male, presents to clinic for routine f/up  Hypertension:  Currently managed on bystolic and losartan - just having high bp and cardiologist increased his dose from 25 to 50  Pt reports good med compliance and denies any SE.   Blood pressure today is well controlled. BP Readings from Last 3 Encounters:  08/11/22 132/74  02/07/22 128/82  01/18/22 120/70  Pt denies CP, SOB, exertional sx, LE edema, palpitation, Ha's, visual disturbances, lightheadedness, hypotension, syncope.   Walks 15-20K steps a day, eats salads and very little, low salt  Asthma/allergies breo in am, albuterol rescue -reports that his asthma has been really well controlled especially this last year no recent exacerbations  Last OV with PCP was May 2023:  HTN: taking medication, denies chest pain or palpitation. BP today is at goal. He is compliant with medication and denies side effects -changes noted above   GERD: doing well on medication, no heart burn or indigestion. He is taking Aciphex daily, discussed importance of trying to wean self off due to long term use of PPI's Still taking daily refer to GI father with esophagus cancer   Asthma: he is taking Breo, denies wheezing or SOB, he states usually worse in the Spring .  He has high allergy to pine ( cleaners/pine needles...) , it triggers his allergies and asthma. He had a flare after a recent episode of bronchitis, but cough has resolved now    Hyperlipidemia: taking  Crestor,  Zetia, Vascepa.. He has CAD but denies chest pain or decrease in exercise tolerance.  Denies chest pain or palpitation.  Recently f/up with cardiology- Labs were recently done at cardiology reviewed today through  the care everywhere   CAD: reviewed care everywhere last cardiology visit   Hyperglycemia: last hgbA1C was back to normal. He  denies polyphagia, polydipsia or polyuria. He is active at his new job at [employer] Value Date   HGBA1C 5.7 (H) 09/24/2019  Rechecked last year and was normal he would like to check again  Labs through care everywhere A1c was normal     Anxiety: taking Lexapro, has to take it at night because it makes him feel tired, controlling symptoms, he is feeling well  He reports that Lexapro did make him gain weight but his weight has been stable for some time he reports it still effective, needs refills    08/11/2022    9:39 AM 02/07/2022   10:44 AM 01/18/2022    3:47 PM  Depression screen PHQ 2/9  Decreased Interest 0 0 0  Down, Depressed, Hopeless 0 0 0  PHQ - 2 Score 0 0 0  Altered sleeping 0 0 0  Tired, decreased energy 0 0 0  Change in appetite 0 0 0  Feeling bad or failure about yourself  0 0 0  Trouble concentrating 0 0 0  Moving slowly or fidgety/restless 0 0 0  Suicidal thoughts 0 0 0  PHQ-9 Score 0 0 0  Difficult doing work/chores Not difficult at all  Not difficult at all      08/11/2022    9:44 AM 03/27/2019    7:58 AM  03/19/2018    9:56 AM  GAD 7 : Generalized Anxiety Score  Nervous, Anxious, on Edge 0 0 0  Control/stop worrying 0 0 0  Worry too much - different things 0 0 0  Trouble relaxing 0 0 0  Restless 0 0 0  Easily annoyed or irritable 0 0 0  Afraid - awful might happen 0 0 0  Total GAD 7 Score 0 0 0  Anxiety Difficulty Not difficult at all  Not difficult at all    On plavix long term - 2008/2011 total  5 stents not on ASA due to nose bleeds  Toe/foot issues:  Right great to stiffness, pain wants podiatry     Current Outpatient Medications:    albuterol (VENTOLIN HFA) 108 (90 Base) MCG/ACT inhaler, TAKE 2 PUFFS BY MOUTH EVERY 6 HOURS AS NEEDED FOR WHEEZE OR SHORTNESS OF BREATH, Disp: 8.5 each, Rfl: 1    icosapent Ethyl (VASCEPA) 1 g capsule, Take 2 capsules (2 g total) by mouth 2 (two) times daily., Disp: 480 capsule, Rfl: 1   ipratropium-albuterol (DUONEB) 0.5-2.5 (3) MG/3ML SOLN, Take 3 mLs by nebulization every 6 (six) hours as needed (coughing fits, bronchitis following CAP with hx of asthma)., Disp: 90 mL, Rfl: 1   clopidogrel (PLAVIX) 75 MG tablet, Take 1 tablet (75 mg total) by mouth daily., Disp: 90 tablet, Rfl: 1   escitalopram (LEXAPRO) 10 MG tablet, Take 1 tablet (10 mg total) by mouth daily., Disp: 90 tablet, Rfl: 1   ezetimibe (ZETIA) 10 MG tablet, Take 1 tablet (10 mg total) by mouth daily., Disp: 90 tablet, Rfl: 1   fluticasone furoate-vilanterol (BREO ELLIPTA) 100-25 MCG/ACT AEPB, Inhale 1 puff into the lungs daily., Disp: 90 each, Rfl: 3   losartan (COZAAR) 25 MG tablet, Take 2 tablets (50 mg total) by mouth daily., Disp: 90 tablet, Rfl: 1   nebivolol (BYSTOLIC) 5 MG tablet, Take 1 tablet (5 mg total) by mouth daily. Future refill request will need to be sent to new Cardiologist, Disp: 90 tablet, Rfl: 1   RABEprazole (ACIPHEX) 20 MG tablet, Take 1 tablet (20 mg total) by mouth daily., Disp: 90 tablet, Rfl: 1   rosuvastatin (CRESTOR) 40 MG tablet, Take 1 tablet (40 mg total) by mouth daily., Disp: 90 tablet, Rfl: 1   valACYclovir (VALTREX) 1000 MG tablet, Take 1 tablet (1,000 mg total) by mouth 3 (three) times daily. (Patient not taking: Reported on 08/11/2022), Disp: 30 tablet, Rfl: 0  Patient Active Problem List   Diagnosis Date Noted   History of heart artery stent 02/07/2022   History of pneumonia 02/07/2022   Moderate persistent asthma without complication 02/07/2022   History of shingles 02/07/2022   Fever blister 02/07/2022   Hypertension 04/12/2015   Gastroesophageal reflux disease without esophagitis 03/22/2015   Anxiety 03/22/2015   Pure hypercholesterolemia 12/10/2012   Family history of ischemic heart disease (IHD) 10/24/2012   History of fracture of rib  09/04/2011   Traumatic pneumothorax 09/04/2011   Coronary artery disease involving native coronary artery of native heart without angina pectoris 08/01/2007    Past Surgical History:  Procedure Laterality Date   ANKLE FRACTURE SURGERY     CARDIAC CATHETERIZATION  2011   CARDIAC CATHETERIZATION  2008   CORONARY STENT PLACEMENT  2008 & 2011    Family History  Problem Relation Age of Onset   Parkinson's disease Mother    Cancer Father     Social History   Tobacco Use   Smoking status:  Never   Smokeless tobacco: Never  Vaping Use   Vaping Use: Never used  Substance Use Topics   Alcohol use: Yes    Alcohol/week: 0.0 standard drinks of alcohol    Comment: occasional wine   Drug use: No     Allergies  Allergen Reactions   Molds & Smuts Shortness Of Breath   Ambrosia Trifida (Tall Ragweed) Allergy Skin Test    Fish Oil Hives   Gramineae Pollens    Peanut Oil    Tetracyclines & Related Other (See Comments)    Intolerance - stomach upset    Health Maintenance  Topic Date Due   Medicare Annual Wellness (AWV)  Never done   COVID-19 Vaccine (4 - Pfizer risk series) 08/27/2022 (Originally 10/07/2020)   COLONOSCOPY (Pts 45-71yrs Insurance coverage will need to be confirmed)  12/19/2022   TETANUS/TDAP  03/19/2028   Pneumonia Vaccine 1+ Years old  Completed   INFLUENZA VACCINE  Completed   Hepatitis C Screening  Completed   HIV Screening  Completed   Zoster Vaccines- Shingrix  Completed   HPV VACCINES  Aged Out    Chart Review Today: I personally reviewed active problem list, medication list, allergies, family history, social history, health maintenance, notes from last encounter, lab results, imaging with the patient/caregiver today.   Review of Systems  Constitutional: Negative.   HENT: Negative.    Eyes: Negative.   Respiratory: Negative.    Cardiovascular: Negative.   Gastrointestinal: Negative.   Endocrine: Negative.   Genitourinary: Negative.    Musculoskeletal: Negative.   Skin: Negative.   Allergic/Immunologic: Negative.   Neurological: Negative.   Hematological: Negative.   Psychiatric/Behavioral: Negative.    All other systems reviewed and are negative.    Objective:   Vitals:   08/11/22 0940  BP: 132/74  Pulse: 67  Resp: 16  Temp: 98.4 F (36.9 C)  TempSrc: Oral  SpO2: 95%  Weight: 200 lb (90.7 kg)  Height: 5\' 10"  (1.778 m)    Body mass index is 28.7 kg/m.  Physical Exam Vitals and nursing note reviewed.  Constitutional:      General: He is not in acute distress.    Appearance: Normal appearance. He is well-developed and well-groomed. He is not ill-appearing, toxic-appearing or diaphoretic.  HENT:     Head: Normocephalic and atraumatic.     Jaw: No trismus.     Right Ear: External ear normal.     Left Ear: External ear normal.  Eyes:     General: Lids are normal. No scleral icterus.       Right eye: No discharge.        Left eye: No discharge.     Conjunctiva/sclera: Conjunctivae normal.  Neck:     Trachea: Trachea and phonation normal. No tracheal deviation.  Cardiovascular:     Rate and Rhythm: Normal rate and regular rhythm.     Pulses: Normal pulses.          Radial pulses are 2+ on the right side and 2+ on the left side.       Posterior tibial pulses are 2+ on the right side and 2+ on the left side.     Heart sounds: Normal heart sounds. No murmur heard.    No friction rub. No gallop.  Pulmonary:     Effort: Pulmonary effort is normal. No respiratory distress.     Breath sounds: Normal breath sounds. No stridor. No wheezing, rhonchi or rales.  Abdominal:  General: Bowel sounds are normal. There is no distension.     Palpations: Abdomen is soft.  Musculoskeletal:     Right lower leg: No edema.     Left lower leg: No edema.     Comments: Right great toe- pt shows me through sock, limited ROM  Skin:    General: Skin is warm and dry.     Coloration: Skin is not jaundiced.      Findings: No rash.     Nails: There is no clubbing.  Neurological:     Mental Status: He is alert. Mental status is at baseline.     Cranial Nerves: No dysarthria or facial asymmetry.     Motor: No tremor or abnormal muscle tone.     Gait: Gait normal.  Psychiatric:        Mood and Affect: Mood normal.        Speech: Speech normal.        Behavior: Behavior normal. Behavior is cooperative.         Assessment & Plan:   Problem List Items Addressed This Visit       Cardiovascular and Mediastinum   Hypertension - Primary    Blood pressure had been elevated at the cardiologist and with monitoring at home his readings were systolic often around 148 Cardiologist told him to take 2 of the losartan so he is currently taking 50 mg daily his blood pressure today is well controlled on losartan 50 mg and Bystolic BP Readings from Last 3 Encounters:  08/11/22 132/74  02/07/22 128/82  01/18/22 120/70  He reports that for years he has worked on a healthy diet, avoiding salt, exercising and this is the first time his blood pressure has started to increase Refills sent in today of losartan 50 mg       Relevant Medications   ezetimibe (ZETIA) 10 MG tablet   nebivolol (BYSTOLIC) 5 MG tablet   rosuvastatin (CRESTOR) 40 MG tablet   losartan (COZAAR) 50 MG tablet   Coronary artery disease involving native coronary artery of native heart without angina pectoris   Relevant Medications   clopidogrel (PLAVIX) 75 MG tablet   ezetimibe (ZETIA) 10 MG tablet   nebivolol (BYSTOLIC) 5 MG tablet   rosuvastatin (CRESTOR) 40 MG tablet   losartan (COZAAR) 50 MG tablet     Respiratory   Moderate persistent asthma without complication    Symptoms stable and well-controlled currently on Breo daily, uses allergy medications and albuterol as a rescue when needed he reports no exacerbations in about the past year I did review his PCPs last note from about 6 months ago, and at that time he had reported a  recent exacerbation and a recent bout of pneumonia Currently patient states his symptoms are well controlled with the fall weather changes      Relevant Medications   fluticasone furoate-vilanterol (BREO ELLIPTA) 100-25 MCG/ACT AEPB   Other Relevant Orders   CBC with Differential/Platelet (Completed)     Digestive   Gastroesophageal reflux disease without esophagitis    Longstanding severe acid reflux, patient is unable to wean off of PPI medication His father died of esophageal cancer He denies any abdominal pain, dysphagia, unintentional weight loss He has never been to a gastroenterologist for his acid reflux or an upper endoscopy he agrees to seeing GI for f/up on GERD and PPI dependence He was advised previously by his PCP of the same but had not gone to GI, he was advised to  try to wean off of the PPI but if he skips a day he has severe epigastric burning and indigestion      Relevant Medications   RABEprazole (ACIPHEX) 20 MG tablet   Other Relevant Orders   Ambulatory referral to Gastroenterology     Other   Anxiety    Patient has been on Lexapro for many years he states his mood is good, when he started the medication it caused him to gain some weight but his weight has been stable for long time and he otherwise feels well his PHQ-9 and GAD-7 were reviewed and negative today, meds refilled      Relevant Medications   escitalopram (LEXAPRO) 10 MG tablet   Pure hypercholesterolemia    On Zetia, Crestor and Vascepa, labs recently done per cardiology patient states, reviewed through care everywhere today      Relevant Medications   ezetimibe (ZETIA) 10 MG tablet   nebivolol (BYSTOLIC) 5 MG tablet   rosuvastatin (CRESTOR) 40 MG tablet   losartan (COZAAR) 50 MG tablet   Other Relevant Orders   Hepatic function panel   Other Visit Diagnoses     Screening for HIV without presence of risk factors       Relevant Orders   HIV Antibody (routine testing w rflx)   Prediabetes        Pt wishes to recheck labs, last in our system is 5.7   Relevant Orders   Hemoglobin A1c   Pain of toe of right foot       stiffness and pain, concerned he may have bunion- right great toe, consult podiatry   Relevant Orders   Ambulatory referral to Podiatry   Encounter for medication monitoring       Relevant Orders   CBC with Differential/Platelet (Completed)   Hepatic function panel   Hemoglobin A1c   Ambulatory referral to Podiatry   Encounter for current long term use of antiplatelet drug       Relevant Medications   clopidogrel (PLAVIX) 75 MG tablet   Other Relevant Orders   CBC with Differential/Platelet (Completed)        Return in about 6 months (around 02/09/2023) for Routine follow-up PCP .   Danelle BerryLeisa Keven Soucy, PA-C 08/11/22 10:04 AM

## 2022-08-11 NOTE — Assessment & Plan Note (Signed)
Symptoms stable and well-controlled currently on Breo daily, uses allergy medications and albuterol as a rescue when needed he reports no exacerbations in about the past year I did review his PCPs last note from about 6 months ago, and at that time he had reported a recent exacerbation and a recent bout of pneumonia Currently patient states his symptoms are well controlled with the fall weather changes

## 2022-08-11 NOTE — Assessment & Plan Note (Signed)
Blood pressure had been elevated at the cardiologist and with monitoring at home his readings were systolic often around 148 Cardiologist told him to take 2 of the losartan so he is currently taking 50 mg daily his blood pressure today is well controlled on losartan 50 mg and Bystolic BP Readings from Last 3 Encounters:  08/11/22 132/74  02/07/22 128/82  01/18/22 120/70  He reports that for years he has worked on a healthy diet, avoiding salt, exercising and this is the first time his blood pressure has started to increase Refills sent in today of losartan 50 mg

## 2022-08-11 NOTE — Assessment & Plan Note (Signed)
Patient has been on Lexapro for many years he states his mood is good, when he started the medication it caused him to gain some weight but his weight has been stable for long time and he otherwise feels well his PHQ-9 and GAD-7 were reviewed and negative today, meds refilled

## 2022-08-12 LAB — CBC WITH DIFFERENTIAL/PLATELET
Absolute Monocytes: 515 cells/uL (ref 200–950)
Basophils Absolute: 50 cells/uL (ref 0–200)
Basophils Relative: 1 %
Eosinophils Absolute: 275 cells/uL (ref 15–500)
Eosinophils Relative: 5.5 %
HCT: 42.4 % (ref 38.5–50.0)
Hemoglobin: 14.6 g/dL (ref 13.2–17.1)
Lymphs Abs: 1030 cells/uL (ref 850–3900)
MCH: 31.7 pg (ref 27.0–33.0)
MCHC: 34.4 g/dL (ref 32.0–36.0)
MCV: 92.2 fL (ref 80.0–100.0)
MPV: 9.8 fL (ref 7.5–12.5)
Monocytes Relative: 10.3 %
Neutro Abs: 3130 cells/uL (ref 1500–7800)
Neutrophils Relative %: 62.6 %
Platelets: 257 10*3/uL (ref 140–400)
RBC: 4.6 10*6/uL (ref 4.20–5.80)
RDW: 12.8 % (ref 11.0–15.0)
Total Lymphocyte: 20.6 %
WBC: 5 10*3/uL (ref 3.8–10.8)

## 2022-08-12 LAB — HEPATIC FUNCTION PANEL
AG Ratio: 1.8 (calc) (ref 1.0–2.5)
ALT: 28 U/L (ref 9–46)
AST: 28 U/L (ref 10–35)
Albumin: 4.6 g/dL (ref 3.6–5.1)
Alkaline phosphatase (APISO): 61 U/L (ref 35–144)
Bilirubin, Direct: 0.1 mg/dL (ref 0.0–0.2)
Globulin: 2.5 g/dL (calc) (ref 1.9–3.7)
Indirect Bilirubin: 0.6 mg/dL (calc) (ref 0.2–1.2)
Total Bilirubin: 0.7 mg/dL (ref 0.2–1.2)
Total Protein: 7.1 g/dL (ref 6.1–8.1)

## 2022-08-12 LAB — HEMOGLOBIN A1C
Hgb A1c MFr Bld: 6.1 % of total Hgb — ABNORMAL HIGH (ref <5.7)
Mean Plasma Glucose: 128 mg/dL
eAG (mmol/L): 7.1 mmol/L

## 2022-08-12 LAB — HIV ANTIBODY (ROUTINE TESTING W REFLEX): HIV 1&2 Ab, 4th Generation: NONREACTIVE

## 2022-08-21 ENCOUNTER — Emergency Department
Admission: EM | Admit: 2022-08-21 | Discharge: 2022-08-21 | Disposition: A | Discharge status: Home / Self Care | Payer: PRIVATE HEALTH INSURANCE | Attending: Emergency Medicine | Admitting: Emergency Medicine

## 2022-08-21 DIAGNOSIS — Z23 Encounter for immunization: Secondary | ICD-10-CM | POA: Insufficient documentation

## 2022-08-21 DIAGNOSIS — S51812A Laceration without foreign body of left forearm, initial encounter: Secondary | ICD-10-CM | POA: Insufficient documentation

## 2022-08-21 DIAGNOSIS — Y990 Civilian activity done for income or pay: Secondary | ICD-10-CM | POA: Diagnosis not present

## 2022-08-21 DIAGNOSIS — S41112A Laceration without foreign body of left upper arm, initial encounter: Secondary | ICD-10-CM

## 2022-08-21 DIAGNOSIS — W268XXA Contact with other sharp object(s), not elsewhere classified, initial encounter: Secondary | ICD-10-CM | POA: Insufficient documentation

## 2022-08-21 MED ORDER — TETANUS-DIPHTH-ACELL PERTUSSIS 5-2.5-18.5 LF-MCG/0.5 IM SUSY
0.5000 mL | PREFILLED_SYRINGE | Freq: Once | INTRAMUSCULAR | Status: AC
  Administered 2022-08-21: 0.5 mL via INTRAMUSCULAR
  Filled 2022-08-21: qty 0.5

## 2022-08-21 MED ORDER — LIDOCAINE HCL (PF) 1 % IJ SOLN
10.0000 mL | Freq: Once | INTRAMUSCULAR | Status: AC
  Administered 2022-08-21: 10 mL
  Filled 2022-08-21: qty 10

## 2022-08-21 NOTE — ED Provider Triage Note (Signed)
Emergency Medicine Provider Triage Evaluation Note  Paul Page , a 66 y.o. male  was evaluated in triage.  Pt complains of a work-related injury.  Presents with a superficial laceration to his left volar forearm, after he accidentally cut himself with a recently change box cutter blade.  He denies any injury at the time.  Patient presents with bleeding controlled.  He is unclear of his current tetanus status.  Review of Systems  Positive: L forearm lac Negative: paresthesias  Physical Exam  BP (!) 154/100 (BP Location: Right Arm)   Pulse 79   Temp 97.8 F (36.6 C) (Oral)   Resp 16   Wt 90.7 kg   SpO2 98%   BMI 28.70 kg/m  Gen:   Awake, no distress  NAD Resp:  Normal effort CTA MSK:   Moves extremities without difficulty L volar forearm with a 1.5 cm linear lac through the dermis Other:    Medical Decision Making  Medically screening exam initiated at 3:09 PM.  Appropriate orders placed.  Paul Page was informed that the remainder of the evaluation will be completed by another provider, this initial triage assessment does not replace that evaluation, and the importance of remaining in the ED until their evaluation is complete.  Patient to the ED for evaluation of an accidental work-related laceration to the left forearm.   Paul Hoard, PA-C 08/21/22 1511

## 2022-08-21 NOTE — ED Triage Notes (Signed)
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 Open wound present, bleeding controlled.

## 2022-08-21 NOTE — ED Provider Notes (Signed)
[facility] Provider Note  Patient Contact: 4:06 PM (approximate)   History   Laceration   HPI  Paul Page is a 66 y.o. male who presents the emergency department complaining of a laceration to the left forearm.  Patient was using a box cutter when the blade slipped and lacerated his forearm.  Patient reports heavy bleeding initially was controlled with direct pressure.  Unsure his last tetanus shot.  No other complaints at this time.     Physical Exam   Triage Vital Signs: ED Triage Vitals [08/21/22 1500]  Enc Vitals Group     BP (!) 154/100     Pulse Rate 79     Resp 16     Temp 97.8 F (36.6 C)     Temp Source Oral     SpO2 98 %     Weight 200 lb (90.7 kg)     Height      Head Circumference      Peak Flow      Pain Score      Pain Loc      Pain Edu?      Excl. in GC?     Most recent vital signs: Vitals:   08/21/22 1500  BP: (!) 154/100  Pulse: 79  Resp: 16  Temp: 97.8 F (36.6 C)  SpO2: 98%     General: Alert and in no acute distress.  Cardiovascular:  Good peripheral perfusion Respiratory: Normal respiratory effort without tachypnea or retractions. Lungs CTAB.  Musculoskeletal: Full range of motion to all extremities.  Roughly 3 cm laceration noted to the left forearm.  Edges are smooth in nature.  No active bleeding.  No visible foreign body. Neurologic:  No gross focal neurologic deficits are appreciated.  Skin:   No rash noted Other:   ED Results / Procedures / Treatments   Labs (all labs ordered are listed, but only abnormal results are displayed) Labs Reviewed - No data to display   EKG     RADIOLOGY    No results found.  PROCEDURES:  Critical Care performed: No  ..Laceration Repair  Date/Time: 08/21/2022 4:43 PM  Performed by: Racheal Patches, PA-C Authorized by: Racheal Patches, PA-C   Consent:    Consent obtained:  Verbal   Consent given by:  Patient   Risks discussed:   Infection and pain Universal protocol:    Procedure explained and questions answered to patient or proxy's satisfaction: yes     Immediately prior to procedure, a time out was called: yes     Patient identity confirmed:  Verbally with patient Anesthesia:    Anesthesia method:  Local infiltration   Local anesthetic:  Lidocaine 1% w/o epi Laceration details:    Location:  Shoulder/arm   Shoulder/arm location:  L lower arm   Length (cm):  3 Pre-procedure details:    Preparation:  Patient was prepped and draped in usual sterile fashion Exploration:    Hemostasis achieved with:  Direct pressure   Imaging outcome: foreign body not noted     Wound extent: no foreign body, no nerve damage, no tendon damage, no underlying fracture and no vascular damage     Contaminated: no   Treatment:    Area cleansed with:  Povidone-iodine and saline   Amount of cleaning:  Standard   Irrigation solution:  Sterile saline   Irrigation volume:  500 ml   Irrigation method:  Syringe Skin repair:    Repair method:  Sutures   Suture size:  4-0   Suture material:  Nylon   Suture technique:  Simple interrupted   Number of sutures:  3 Approximation:    Approximation:  Close Repair type:    Repair type:  Simple Post-procedure details:    Dressing:  Open (no dressing)   Procedure completion:  Tolerated well, no immediate complications    MEDICATIONS ORDERED IN ED: Medications  Tdap (BOOSTRIX) injection 0.5 mL (0.5 mLs Intramuscular Given by Other 08/21/22 1625)  lidocaine (PF) (XYLOCAINE) 1 % injection 10 mL (10 mLs Infiltration Given by Other 08/21/22 1625)     IMPRESSION / MDM / ASSESSMENT AND PLAN / ED COURSE  I reviewed the triage vital signs and the nursing notes.                              Differential diagnosis includes, but is not limited to, forearm laceration, retained foreign body, ligamentous injury, vascular injury   Patient's presentation is most consistent with acute  presentation with potential threat to life or bodily function.   Patient's diagnosis is consistent with forearm laceration.  Patient was here at work, using a razor blade when he lacerated the left forearm.  He was unsure of his last shot and this was updated.  Patient has a laceration along the left forearm that was closed described above with no complications.  Wound care instructions discussed with patient.  Follow-up with primary care in 1 week for suture removal.   Patient is given ED precautions to return to the ED for any worsening or new symptoms.        FINAL CLINICAL IMPRESSION(S) / ED DIAGNOSES   Final diagnoses:  Laceration of left upper extremity, initial encounter     Rx / DC Orders   ED Discharge Orders     None        Note:  This document was prepared using Dragon voice recognition software and may include unintentional dictation errors.   Lanette Hampshire 08/21/22 1644    Jene Every, MD 08/21/22 [phone]

## 2022-09-21 ENCOUNTER — Ambulatory Visit: Payer: Medicare Other | Admitting: Podiatry

## 2022-09-21 DIAGNOSIS — M19071 Primary osteoarthritis, right ankle and foot: Secondary | ICD-10-CM

## 2022-09-28 ENCOUNTER — Encounter: Payer: Self-pay | Admitting: Podiatry

## 2022-09-28 NOTE — Progress Notes (Signed)
Subjective:  Patient ID: Paul Page, male    DOB: Jun 07, 1956,  MRN: [id number]  Chief Complaint  Patient presents with   Toe Pain    66 y.o. male presents with the above complaint.  Patient presents with complaint of right first metatarsophalangeal joint pain.  Patient states there is burning sensation.  The big toe and second toe also rub against each other has been going on for quite some time is progressive gotten worse.  He denies seeing anyone else prior to seeing me he would like to discuss treatment options for this.  Hurts with ambulation worse with pressure.   Review of Systems: Negative except as noted in the HPI. Denies N/V/F/Ch.  Past Medical History:  Diagnosis Date   Anxiety    Asthma    Coronary artery disease    Three-vessel coronary artery disease diagnosed in October 2008 with normal ejection fraction. PCI and drug-eluting stent placement to the LAD(3.0 x 8 mm Promus), left circumflex(2.5 x 12 mm Promus) and right coronary artery(3.5 x 12 mm Promus). Unstable angina in 2011. PCI of RCA with a 3.5 x 15 mm Promus and PCI of LAD with a 2.5 x 28 mm Promus drug-eluting stents    GERD (gastroesophageal reflux disease)    Heart disease    Hyperlipidemia    Hypertension    Traumatic pneumothorax 09/04/2011   Overview:   08/22/11  S/P fall, right.    Current Outpatient Medications:    albuterol (VENTOLIN HFA) 108 (90 Base) MCG/ACT inhaler, TAKE 2 PUFFS BY MOUTH EVERY 6 HOURS AS NEEDED FOR WHEEZE OR SHORTNESS OF BREATH, Disp: 8.5 each, Rfl: 1   clopidogrel (PLAVIX) 75 MG tablet, Take 1 tablet (75 mg total) by mouth daily., Disp: 90 tablet, Rfl: 1   escitalopram (LEXAPRO) 10 MG tablet, Take 1 tablet (10 mg total) by mouth daily., Disp: 90 tablet, Rfl: 1   ezetimibe (ZETIA) 10 MG tablet, Take 1 tablet (10 mg total) by mouth daily., Disp: 90 tablet, Rfl: 1   fluticasone furoate-vilanterol (BREO ELLIPTA) 100-25 MCG/ACT AEPB, Inhale 1 puff into the lungs daily., Disp: 90  each, Rfl: 3   icosapent Ethyl (VASCEPA) 1 g capsule, Take 2 capsules (2 g total) by mouth 2 (two) times daily., Disp: 480 capsule, Rfl: 1   ipratropium-albuterol (DUONEB) 0.5-2.5 (3) MG/3ML SOLN, Take 3 mLs by nebulization every 6 (six) hours as needed (coughing fits, bronchitis following CAP with hx of asthma)., Disp: 90 mL, Rfl: 1   losartan (COZAAR) 50 MG tablet, Take 1 tablet (50 mg total) by mouth daily., Disp: 90 tablet, Rfl: 1   nebivolol (BYSTOLIC) 5 MG tablet, Take 1 tablet (5 mg total) by mouth daily. Future refill request will need to be sent to new Cardiologist, Disp: 90 tablet, Rfl: 1   RABEprazole (ACIPHEX) 20 MG tablet, Take 1 tablet (20 mg total) by mouth daily., Disp: 90 tablet, Rfl: 1   rosuvastatin (CRESTOR) 40 MG tablet, Take 1 tablet (40 mg total) by mouth daily., Disp: 90 tablet, Rfl: 1  Social History   Tobacco Use  Smoking Status Never  Smokeless Tobacco Never    Allergies  Allergen Reactions   Molds & Smuts Shortness Of Breath   Ambrosia Trifida (Tall Ragweed) Allergy Skin Test    Fish Oil Hives   Gramineae Pollens    Peanut Oil    Tetracyclines & Related Other (See Comments)    Intolerance - stomach upset   Objective:  There were no vitals filed for this  visit. There is no height or weight on file to calculate BMI. Constitutional Well developed. Well nourished.  Vascular Dorsalis pedis pulses palpable bilaterally. Posterior tibial pulses palpable bilaterally. Capillary refill normal to all digits.  No cyanosis or clubbing noted. Pedal hair growth normal.  Neurologic Normal speech. Oriented to person, place, and time. Epicritic sensation to light touch grossly present bilaterally.  Dermatologic Nails well groomed and normal in appearance. No open wounds. No skin lesions.  Orthopedic: Pain on palpation right first metatarsophalangeal joint.  Pain with range of motion of the first MPJ joint.  Deep intra-articular first MPJ pain noted limited range of  motion noted at the first MPJ joint.  Severe arthritis clinically appreciated crepitus clinically appreciated   Radiographs: None Assessment:   1. Arthritis of first metatarsophalangeal (MTP) joint of right foot    Plan:  Patient was evaluated and treated and all questions answered.  Right first MPJ severe arthritis -All questions and concerns were discussed with the patient in extensive detail given the amount of pain that he is experiencing benefit from steroid injection of decrease acute inflammatory component associate with pain.  Patient agrees with plan like to proceed with steroid injection -A steroid injection was performed at right first MTP using 1% plain Lidocaine and 10 mg of Kenalog. This was well tolerated.   No follow-ups on file.

## 2022-09-30 ENCOUNTER — Emergency Department: Payer: Medicare Other

## 2022-09-30 ENCOUNTER — Inpatient Hospital Stay
Admission: EM | Admit: 2022-09-30 | Discharge: 2022-10-06 | DRG: 193 | Disposition: A | Discharge status: Home / Self Care | Payer: Medicare Other | Attending: Internal Medicine | Admitting: Internal Medicine

## 2022-09-30 ENCOUNTER — Other Ambulatory Visit: Payer: Self-pay

## 2022-09-30 ENCOUNTER — Encounter: Payer: Self-pay | Admitting: Internal Medicine

## 2022-09-30 DIAGNOSIS — Z91048 Other nonmedicinal substance allergy status: Secondary | ICD-10-CM

## 2022-09-30 DIAGNOSIS — I2510 Atherosclerotic heart disease of native coronary artery without angina pectoris: Secondary | ICD-10-CM | POA: Diagnosis present

## 2022-09-30 DIAGNOSIS — K529 Noninfective gastroenteritis and colitis, unspecified: Secondary | ICD-10-CM | POA: Diagnosis present

## 2022-09-30 DIAGNOSIS — J101 Influenza due to other identified influenza virus with other respiratory manifestations: Secondary | ICD-10-CM

## 2022-09-30 DIAGNOSIS — K219 Gastro-esophageal reflux disease without esophagitis: Secondary | ICD-10-CM | POA: Diagnosis present

## 2022-09-30 DIAGNOSIS — J4540 Moderate persistent asthma, uncomplicated: Secondary | ICD-10-CM | POA: Diagnosis not present

## 2022-09-30 DIAGNOSIS — Z7902 Long term (current) use of antithrombotics/antiplatelets: Secondary | ICD-10-CM | POA: Diagnosis not present

## 2022-09-30 DIAGNOSIS — J159 Unspecified bacterial pneumonia: Secondary | ICD-10-CM | POA: Diagnosis present

## 2022-09-30 DIAGNOSIS — J9601 Acute respiratory failure with hypoxia: Secondary | ICD-10-CM | POA: Diagnosis present

## 2022-09-30 DIAGNOSIS — Z820 Family history of epilepsy and other diseases of the nervous system: Secondary | ICD-10-CM

## 2022-09-30 DIAGNOSIS — Z8249 Family history of ischemic heart disease and other diseases of the circulatory system: Secondary | ICD-10-CM

## 2022-09-30 DIAGNOSIS — Z955 Presence of coronary angioplasty implant and graft: Secondary | ICD-10-CM

## 2022-09-30 DIAGNOSIS — A419 Sepsis, unspecified organism: Secondary | ICD-10-CM | POA: Diagnosis not present

## 2022-09-30 DIAGNOSIS — F32A Depression, unspecified: Secondary | ICD-10-CM | POA: Diagnosis present

## 2022-09-30 DIAGNOSIS — J1008 Influenza due to other identified influenza virus with other specified pneumonia: Secondary | ICD-10-CM | POA: Diagnosis present

## 2022-09-30 DIAGNOSIS — F419 Anxiety disorder, unspecified: Secondary | ICD-10-CM | POA: Diagnosis present

## 2022-09-30 DIAGNOSIS — Z79899 Other long term (current) drug therapy: Secondary | ICD-10-CM

## 2022-09-30 DIAGNOSIS — J45901 Unspecified asthma with (acute) exacerbation: Secondary | ICD-10-CM

## 2022-09-30 DIAGNOSIS — R197 Diarrhea, unspecified: Secondary | ICD-10-CM | POA: Diagnosis not present

## 2022-09-30 DIAGNOSIS — I10 Essential (primary) hypertension: Secondary | ICD-10-CM | POA: Diagnosis present

## 2022-09-30 DIAGNOSIS — J189 Pneumonia, unspecified organism: Secondary | ICD-10-CM | POA: Diagnosis present

## 2022-09-30 DIAGNOSIS — N179 Acute kidney failure, unspecified: Secondary | ICD-10-CM | POA: Diagnosis present

## 2022-09-30 DIAGNOSIS — E7800 Pure hypercholesterolemia, unspecified: Secondary | ICD-10-CM | POA: Diagnosis not present

## 2022-09-30 DIAGNOSIS — Z1152 Encounter for screening for COVID-19: Secondary | ICD-10-CM

## 2022-09-30 DIAGNOSIS — R911 Solitary pulmonary nodule: Secondary | ICD-10-CM | POA: Insufficient documentation

## 2022-09-30 DIAGNOSIS — R0602 Shortness of breath: Secondary | ICD-10-CM | POA: Diagnosis not present

## 2022-09-30 DIAGNOSIS — M7989 Other specified soft tissue disorders: Secondary | ICD-10-CM | POA: Diagnosis not present

## 2022-09-30 DIAGNOSIS — R0902 Hypoxemia: Secondary | ICD-10-CM | POA: Diagnosis not present

## 2022-09-30 DIAGNOSIS — R7989 Other specified abnormal findings of blood chemistry: Secondary | ICD-10-CM | POA: Diagnosis not present

## 2022-09-30 DIAGNOSIS — R052 Subacute cough: Secondary | ICD-10-CM

## 2022-09-30 DIAGNOSIS — S2231XA Fracture of one rib, right side, initial encounter for closed fracture: Secondary | ICD-10-CM | POA: Diagnosis not present

## 2022-09-30 LAB — BASIC METABOLIC PANEL
Anion gap: 12 (ref 5–15)
BUN: 36 mg/dL — ABNORMAL HIGH (ref 8–23)
CO2: 25 mmol/L (ref 22–32)
Calcium: 8.7 mg/dL — ABNORMAL LOW (ref 8.9–10.3)
Chloride: 98 mmol/L (ref 98–111)
Creatinine, Ser: 1.6 mg/dL — ABNORMAL HIGH (ref 0.61–1.24)
GFR, Estimated: 47 mL/min — ABNORMAL LOW (ref >60)
Glucose, Bld: 116 mg/dL — ABNORMAL HIGH (ref 70–99)
Potassium: 3.9 mmol/L (ref 3.5–5.1)
Sodium: 135 mmol/L (ref 135–145)

## 2022-09-30 LAB — GASTROINTESTINAL PANEL BY PCR, STOOL (REPLACES STOOL CULTURE)

## 2022-09-30 LAB — CBC
HCT: 44.1 % (ref 39.0–52.0)
Hemoglobin: 14.9 g/dL (ref 13.0–17.0)
MCH: 31.1 pg (ref 26.0–34.0)
MCHC: 33.8 g/dL (ref 30.0–36.0)
MCV: 92.1 fL (ref 80.0–100.0)
Platelets: 176 10*3/uL (ref 150–400)
RBC: 4.79 MIL/uL (ref 4.22–5.81)
RDW: 12.6 % (ref 11.5–15.5)
WBC: 7.4 10*3/uL (ref 4.0–10.5)
nRBC: 0 % (ref 0.0–0.2)

## 2022-09-30 LAB — C DIFFICILE QUICK SCREEN W PCR REFLEX
C Diff antigen: NEGATIVE
C Diff interpretation: NOT DETECTED
C Diff toxin: NEGATIVE

## 2022-09-30 LAB — LACTIC ACID, PLASMA: Lactic Acid, Venous: 1.9 mmol/L (ref 0.5–1.9)

## 2022-09-30 LAB — MRSA NEXT GEN BY PCR, NASAL: MRSA by PCR Next Gen: NOT DETECTED

## 2022-09-30 LAB — PROCALCITONIN: Procalcitonin: 23.46 ng/mL

## 2022-09-30 LAB — RESP PANEL BY RT-PCR (RSV, FLU A&B, COVID)  RVPGX2
Influenza A by PCR: POSITIVE — AB
Influenza B by PCR: NEGATIVE
Resp Syncytial Virus by PCR: NEGATIVE
SARS Coronavirus 2 by RT PCR: NEGATIVE

## 2022-09-30 MED ORDER — CLOPIDOGREL BISULFATE 75 MG PO TABS
75.0000 mg | ORAL_TABLET | Freq: Every day | ORAL | Status: DC
  Administered 2022-09-30 – 2022-10-06 (×7): 75 mg via ORAL
  Filled 2022-09-30 (×7): qty 1

## 2022-09-30 MED ORDER — SODIUM CHLORIDE 0.9 % IV SOLN: INTRAVENOUS | Status: AC

## 2022-09-30 MED ORDER — IPRATROPIUM-ALBUTEROL 0.5-2.5 (3) MG/3ML IN SOLN: 3.0000 mL | Freq: Four times a day (QID) | RESPIRATORY_TRACT | Status: DC | PRN

## 2022-09-30 MED ORDER — LOPERAMIDE HCL 2 MG PO CAPS
2.0000 mg | ORAL_CAPSULE | ORAL | Status: AC | PRN
  Administered 2022-09-30: 2 mg via ORAL
  Filled 2022-09-30: qty 1

## 2022-09-30 MED ORDER — EZETIMIBE 10 MG PO TABS
10.0000 mg | ORAL_TABLET | Freq: Every day | ORAL | Status: DC
  Administered 2022-10-01 – 2022-10-06 (×6): 10 mg via ORAL
  Filled 2022-09-30 (×6): qty 1

## 2022-09-30 MED ORDER — SODIUM CHLORIDE 0.9 % IV SOLN
2.0000 g | INTRAVENOUS | Status: AC
  Administered 2022-10-01 – 2022-10-04 (×4): 2 g via INTRAVENOUS
  Filled 2022-09-30: qty 20
  Filled 2022-09-30 (×3): qty 2

## 2022-09-30 MED ORDER — LOSARTAN POTASSIUM 50 MG PO TABS
50.0000 mg | ORAL_TABLET | Freq: Every day | ORAL | Status: DC
  Administered 2022-10-01 – 2022-10-05 (×5): 50 mg via ORAL
  Filled 2022-09-30 (×5): qty 1

## 2022-09-30 MED ORDER — ESCITALOPRAM OXALATE 10 MG PO TABS
10.0000 mg | ORAL_TABLET | Freq: Every day | ORAL | Status: DC
  Administered 2022-10-01 – 2022-10-06 (×6): 10 mg via ORAL
  Filled 2022-09-30 (×6): qty 1

## 2022-09-30 MED ORDER — ONDANSETRON HCL 4 MG/2ML IJ SOLN: 4.0000 mg | Freq: Four times a day (QID) | INTRAMUSCULAR | Status: AC | PRN

## 2022-09-30 MED ORDER — HYDRALAZINE HCL 20 MG/ML IJ SOLN: 5.0000 mg | Freq: Three times a day (TID) | INTRAMUSCULAR | Status: DC | PRN

## 2022-09-30 MED ORDER — NEBIVOLOL HCL 5 MG PO TABS
5.0000 mg | ORAL_TABLET | Freq: Every day | ORAL | Status: DC
  Administered 2022-10-01 – 2022-10-05 (×5): 5 mg via ORAL
  Filled 2022-09-30 (×6): qty 1

## 2022-09-30 MED ORDER — ALBUTEROL SULFATE (2.5 MG/3ML) 0.083% IN NEBU
2.5000 mg | INHALATION_SOLUTION | Freq: Once | RESPIRATORY_TRACT | Status: AC
  Administered 2022-09-30: 2.5 mg via RESPIRATORY_TRACT
  Filled 2022-09-30: qty 3

## 2022-09-30 MED ORDER — SODIUM CHLORIDE 0.9 % IV BOLUS
1000.0000 mL | Freq: Once | INTRAVENOUS | Status: AC
  Administered 2022-09-30: 1000 mL via INTRAVENOUS

## 2022-09-30 MED ORDER — ACETAMINOPHEN 500 MG PO TABS: 1000.0000 mg | ORAL_TABLET | Freq: Once | ORAL | Status: DC

## 2022-09-30 MED ORDER — SODIUM CHLORIDE 0.9 % IV SOLN
500.0000 mg | Freq: Once | INTRAVENOUS | Status: AC
  Administered 2022-09-30: 500 mg via INTRAVENOUS
  Filled 2022-09-30: qty 5

## 2022-09-30 MED ORDER — PANTOPRAZOLE SODIUM 40 MG PO TBEC
80.0000 mg | DELAYED_RELEASE_TABLET | Freq: Every day | ORAL | Status: DC
  Administered 2022-10-01 – 2022-10-06 (×6): 80 mg via ORAL
  Filled 2022-09-30 (×6): qty 2

## 2022-09-30 MED ORDER — GUAIFENESIN ER 600 MG PO TB12
600.0000 mg | ORAL_TABLET | Freq: Two times a day (BID) | ORAL | Status: DC | PRN
  Administered 2022-10-01: 600 mg via ORAL
  Filled 2022-09-30: qty 1

## 2022-09-30 MED ORDER — ACETAMINOPHEN 650 MG RE SUPP: 650.0000 mg | Freq: Four times a day (QID) | RECTAL | Status: AC | PRN

## 2022-09-30 MED ORDER — SODIUM CHLORIDE 0.9 % IV SOLN
1.0000 g | Freq: Once | INTRAVENOUS | Status: AC
  Administered 2022-09-30: 1 g via INTRAVENOUS
  Filled 2022-09-30: qty 10

## 2022-09-30 MED ORDER — ENOXAPARIN SODIUM 40 MG/0.4ML IJ SOSY
40.0000 mg | PREFILLED_SYRINGE | INTRAMUSCULAR | Status: DC
  Administered 2022-09-30 – 2022-10-05 (×6): 40 mg via SUBCUTANEOUS
  Filled 2022-09-30 (×6): qty 0.4

## 2022-09-30 MED ORDER — FLUTICASONE FUROATE-VILANTEROL 100-25 MCG/ACT IN AEPB
1.0000 | INHALATION_SPRAY | Freq: Every day | RESPIRATORY_TRACT | Status: DC
  Administered 2022-09-30 – 2022-10-06 (×7): 1 via RESPIRATORY_TRACT
  Filled 2022-09-30 (×2): qty 28

## 2022-09-30 MED ORDER — ACETAMINOPHEN 325 MG PO TABS
650.0000 mg | ORAL_TABLET | Freq: Four times a day (QID) | ORAL | Status: AC | PRN
  Administered 2022-09-30 – 2022-10-01 (×4): 650 mg via ORAL
  Filled 2022-09-30 (×4): qty 2

## 2022-09-30 MED ORDER — ONDANSETRON HCL 4 MG PO TABS: 4.0000 mg | ORAL_TABLET | Freq: Four times a day (QID) | ORAL | Status: AC | PRN

## 2022-09-30 MED ORDER — LACTATED RINGERS IV BOLUS
1000.0000 mL | Freq: Once | INTRAVENOUS | Status: AC
  Administered 2022-09-30: 1000 mL via INTRAVENOUS

## 2022-09-30 MED ORDER — SENNOSIDES-DOCUSATE SODIUM 8.6-50 MG PO TABS: 1.0000 | ORAL_TABLET | Freq: Every evening | ORAL | Status: DC | PRN

## 2022-09-30 MED ORDER — ROSUVASTATIN CALCIUM 20 MG PO TABS
40.0000 mg | ORAL_TABLET | Freq: Every day | ORAL | Status: DC
  Administered 2022-09-30 – 2022-10-05 (×6): 40 mg via ORAL
  Filled 2022-09-30 (×7): qty 2

## 2022-09-30 MED ORDER — IPRATROPIUM-ALBUTEROL 0.5-2.5 (3) MG/3ML IN SOLN
3.0000 mL | Freq: Four times a day (QID) | RESPIRATORY_TRACT | Status: AC
  Administered 2022-09-30 – 2022-10-01 (×4): 3 mL via RESPIRATORY_TRACT
  Filled 2022-09-30 (×4): qty 3

## 2022-09-30 MED ORDER — SODIUM CHLORIDE 0.9 % IV SOLN
500.0000 mg | INTRAVENOUS | Status: AC
  Administered 2022-10-01: 500 mg via INTRAVENOUS
  Filled 2022-09-30: qty 5

## 2022-09-30 NOTE — Assessment & Plan Note (Addendum)
Patient met sepsis criteria with fever, tachycardia and tachypnea.  Influenza a positive with concern of superadded bacterial pneumonia.  Markedly elevated procalcitonin which started trending down.  Preliminary blood cultures negative. MRSA PCR negative

## 2022-09-30 NOTE — H&P (Signed)
History and Physical   Paul Page EQA:[id number] DOB: Apr 13, 1956 DOA: 09/30/2022  PCP: Paul Sizer, MD  Outpatient Specialists: Dr. Gennette Page, [facility] cardiology Patient coming from: Home  I have personally briefly reviewed patient's old medical records in [place].  Chief Concern: Shortness of breath, fever, cough  HPI: [name] is a 61 male with history of CAD, hypertension, hyperlipidemia, asthma, who presents emergency department for chief concerns of cough, fever, shortness of breath for 4 days.  Initial vitals in the emergency department showed temperature of 98.2, respiration rate of 18, heart rate 79, blood pressure 121/69, SpO2 of 92% on 6 L nasal cannula and currently on 8 L high flow nasal cannula.  Serum sodium is 135, potassium 3.9, chloride 98, bicarb 25, BUN of 36, serum creatinine of 1.60, EGFR 47, nonfasting blood glucose of 116, WBC 7.4, hemoglobin 14.9, platelets of 176.  Lactic acid is 1.9. COVID/influenza B/RSV PCR were negative.  Patient tested positive for influenza A by PCR.    ED treatment: Albuterol nebulizer x 2, azithromycin 500 mg IV, ceftriaxone 1 g IV, sodium chloride 1 L bolus, acetaminophen 1 g. ------------------------------  At bedside, he is able to tell his name, his age, he knows he is in the hospital and he knows the current calendar year.  He reports that Friday evening, he developed fever and generalized malaise. He endorses fatigue and fever with tmax of 102.3, he took tylenol at home with fever improvement.   He endroses dry heaves and denies vomiting. He has had watery diarrhea, that started early Friday AM and it was initially brown watery stool and now it is almost clear. He states hte diarrhea has improved today.   He endorses sick exposure with son-in-law on Christmas evening.  He later found out that his son-in-law got sick.  He denies trauma to his person, chest pain, dysuria, hematuria,  syncope.  Social  history: He lives at home with his wife. He denies tobacco, recreational drug use. He endorses infrequent etoh use, last drink was over 1 week ago. He works in [employer].  ROS: Constitutional: no weight change, + fever ENT/Mouth: no sore throat, no rhinorrhea Eyes: no eye pain, no vision changes Cardiovascular: no chest pain, + dyspnea,  no edema, no palpitations Respiratory: + cough, no sputum, no wheezing Gastrointestinal: + nausea, no vomiting, + diarrhea, no constipation Genitourinary: no urinary incontinence, no dysuria, no hematuria Musculoskeletal: no arthralgias, no myalgias Skin: no skin lesions, no pruritus, Neuro: + weakness, no loss of consciousness, no syncope Psych: no anxiety, no depression, + decrease appetite Heme/Lymph: no bruising, no bleeding  ED Course: Discussed with emergency medicine provider, patient requiring hospitalization for chief concerns of acute hypoxic respiratory failure.  Assessment/Plan  Principal Problem:   Acute hypoxemic respiratory failure (HCC) Active Problems:   GERD (gastroesophageal reflux disease)   Hypertension   Family history of ischemic heart disease (IHD)   Coronary artery disease involving native coronary artery of native heart without angina pectoris   Pure hypercholesterolemia   History of heart artery stent   Moderate persistent asthma without complication   CAP (community acquired pneumonia)   Depression   Diarrhea   AKI (acute kidney injury) ([place])   SIRS due to infectious process with organ dysfunction ([place])   Assessment and Plan:  * Acute hypoxemic respiratory failure ([place]) In patient with history of asthma, with exacerbation - Presumed multifactorial including secondary to community-acquired pneumonia in setting of recent influenza A infection - Antibiotic  coverage for CAP and risk for MRSA coverage ordered: Azithromycin 500 mg IV daily, ceftriaxone 2 g IV daily, and vancomycin per pharmacy - Check MRSA by  PCR - Continue oxygen supplementation to maintain SpO2 greater than 92%, flutter valve, incentive spirometry - DuoNebs 4 times daily, 4 doses ordered; AM team to order more as appropriate - No wheezing on physical exam, therefore steroids have not been initiated by myself - Admit to inpatient, telemetry medical  SIRS due to infectious process with organ dysfunction ([place]) - Sepsis not ruled out at this time, patient had increased respiration rate, heart rate, markedly elevated procalcitonin and organ involvement of renal and respiratory - Blood cultures x 2 have been ordered - Procalcitonin elevated, recheck for the next 2 days to ensure appropriate antibiotic treatment - Admit to telemetry medical, inpatient  AKI (acute kidney injury) ([place]) - Presumed secondary to prerenal in setting of GI loss - Status post sodium chloride 1 L bolus.  I ordered additional LR 1 bolus on admission - Sodium chloride IVF at 125 mL/h, 15 hours ordered - Check BMP in the a.m.  Diarrhea - Presumed secondary to gastroenteritis in setting of influenza A - Status post sodium chloride 1 L bolus per EDP - I ordered additional LR 1 L bolus - And sodium chloride infusion at 125 mL/h, 15 hours ordered  Depression - Escitalopram 10 mg daily resumed  CAP (community acquired pneumonia) - Ceftriaxone 2 g IV daily, azithromycin 500 mg IV daily, and vancomycin per pharmacy as above - Recommend patient follow-up with outpatient PCP on discharge for appropriate reimaging to ensure resolution  Moderate persistent asthma without complication - Maintenance inhaler Breo Ellipta 1 puff inhalation daily resumed  Pure hypercholesterolemia - Patient takes rosuvastatin 40 mg daily, ezetimibe 10 mg p.o. daily were resumed - Vascepa not resumed on admission  Coronary artery disease involving native coronary artery of native heart without angina pectoris - Patient takes Plavix 75 mg daily, and missed the last couple of days due  to feeling poorly - Plavix has been resumed on admission - Resumed losartan 50 mg daily, rosuvastatin 40 mg daily, ezetimibe 10 mg daily, nebivolol 5 mg daily  Hypertension - Patient takes losartan 50 mg daily, nebivolol 5 mg daily, resumed 10/01/2022 - Hydralazine 5 mg IV every 8 hours as needed for SBP greater than 180, 4 days ordered  GERD (gastroesophageal reflux disease) - PPI  Chart reviewed.   DVT prophylaxis: Enoxaparin 40 mg subcutaneous every 24 hours Code Status: full code Diet: Heart healthy Family Communication: phone call offered, patient states he can update his spouse Disposition Plan: Pending clinical course Consults called: None at this time Admission status: Inpatient, telemetry medical  Past Medical History:  Diagnosis Date   Anxiety    Asthma    Coronary artery disease    Three-vessel coronary artery disease diagnosed in October 2008 with normal ejection fraction. PCI and drug-eluting stent placement to the LAD(3.0 x 8 mm Promus), left circumflex(2.5 x 12 mm Promus) and right coronary artery(3.5 x 12 mm Promus). Unstable angina in 2011. PCI of RCA with a 3.5 x 15 mm Promus and PCI of LAD with a 2.5 x 28 mm Promus drug-eluting stents    GERD (gastroesophageal reflux disease)    Heart disease    Hyperlipidemia    Hypertension    Traumatic pneumothorax 09/04/2011   Overview:   08/22/11  S/P fall, right.   Past Surgical History:  Procedure Laterality Date   ANKLE FRACTURE SURGERY  CARDIAC CATHETERIZATION  2011   CARDIAC CATHETERIZATION  2008   CORONARY STENT PLACEMENT  2008 & 2011   Social History:  reports that he has never smoked. He has never used smokeless tobacco. He reports current alcohol use. He reports that he does not use drugs.  Allergies  Allergen Reactions   Molds & Smuts Shortness Of Breath   Ambrosia Trifida (Tall Ragweed) Allergy Skin Test    Fish Oil Hives   Gramineae Pollens    Peanut Oil    Tetracyclines & Related Other (See  Comments)    Intolerance - stomach upset   Family History  Problem Relation Age of Onset   Parkinson's disease Mother    Cancer Father    Family history: Family history reviewed and not pertinent  Prior to Admission medications   Medication Sig Start Date End Date Taking? Authorizing Provider  albuterol (VENTOLIN HFA) 108 (90 Base) MCG/ACT inhaler TAKE 2 PUFFS BY MOUTH EVERY 6 HOURS AS NEEDED FOR WHEEZE OR SHORTNESS OF BREATH 03/06/22   Bo Merino, FNP  clopidogrel (PLAVIX) 75 MG tablet Take 1 tablet (75 mg total) by mouth daily. 08/11/22   Delsa Grana, PA-C  escitalopram (LEXAPRO) 10 MG tablet Take 1 tablet (10 mg total) by mouth daily. 08/11/22   Delsa Grana, PA-C  ezetimibe (ZETIA) 10 MG tablet Take 1 tablet (10 mg total) by mouth daily. 08/11/22 02/07/23  Delsa Grana, PA-C  fluticasone furoate-vilanterol (BREO ELLIPTA) 100-25 MCG/ACT AEPB Inhale 1 puff into the lungs daily. 08/11/22   Delsa Grana, PA-C  icosapent Ethyl (VASCEPA) 1 g capsule Take 2 capsules (2 g total) by mouth 2 (two) times daily. 02/07/22   Paul Sizer, MD  ipratropium-albuterol (DUONEB) 0.5-2.5 (3) MG/3ML SOLN Take 3 mLs by nebulization every 6 (six) hours as needed (coughing fits, bronchitis following CAP with hx of asthma). 01/18/22   Delsa Grana, PA-C  losartan (COZAAR) 50 MG tablet Take 1 tablet (50 mg total) by mouth daily. 08/11/22   Delsa Grana, PA-C  nebivolol (BYSTOLIC) 5 MG tablet Take 1 tablet (5 mg total) by mouth daily. Future refill request will need to be sent to new Cardiologist 08/11/22   Delsa Grana, PA-C  RABEprazole (ACIPHEX) 20 MG tablet Take 1 tablet (20 mg total) by mouth daily. 08/11/22   Delsa Grana, PA-C  rosuvastatin (CRESTOR) 40 MG tablet Take 1 tablet (40 mg total) by mouth daily. 08/11/22   Delsa Grana, PA-C   Physical Exam: Vitals:   09/30/22 1330 09/30/22 1400 09/30/22 1430 09/30/22 1500  BP: 130/73 114/69 118/70 (!) 109/59  Pulse: (!) 106 96 90 88  Resp: 18 20 (!) 22    Temp:      TempSrc:      SpO2: 96% 92% 94% 93%  Weight:      Height:       Constitutional: appears age-appropriate, appears acutely ill Eyes: PERRL, lids and conjunctivae normal ENMT: Mucous membranes are moist. Posterior pharynx clear of any exudate or lesions. Age-appropriate dentition. Hearing appropriate Neck: normal, supple, no masses, no thyromegaly Respiratory: Decreased lung sounds on the left lower lobe.  No wheezing, no crackles. Normal respiratory effort. No accessory muscle use.  High flow nasal cannula in place. Cardiovascular: Regular rate and rhythm, no murmurs / rubs / gallops. No extremity edema. 2+ pedal pulses. No carotid bruits.  Abdomen: no tenderness, no masses palpated, no hepatosplenomegaly. Bowel sounds positive.  Musculoskeletal: no clubbing / cyanosis. No joint deformity upper and lower extremities. Good ROM, no  contractures, no atrophy. Normal muscle tone.  Skin: no rashes, lesions, ulcers. No induration Neurologic: Sensation intact. Strength 5/5 in all 4.  Psychiatric: Normal judgment and insight. Alert and oriented x 3. Normal mood.   EKG: independently reviewed, showing sinus rhythm with rate of 81, QTc 406  Chest x-ray on Admission: I personally reviewed and I agree with radiologist reading as below.  DG Chest 2 View  Result Date: 09/30/2022 CLINICAL DATA:  Shortness of breath.  Cough. EXAM: CHEST - 2 VIEW COMPARISON:  March 09, 2022 FINDINGS: Left retrocardiac opacity is best seen on the lateral view, new in the interval. The heart, hila, mediastinum are unremarkable. No pneumothorax. Healed right rib fractures. No nodules or masses. IMPRESSION: Left retrocardiac opacity is new in the interval and concerning for infiltrate/pneumonia. Recommend short-term follow-up imaging to ensure resolution. Electronically Signed   By: Dorise Bullion III M.D.   On: 09/30/2022 11:28    Labs on Admission: I have personally reviewed following labs  CBC: Recent Labs  Lab  09/30/22 1053  WBC 7.4  HGB 14.9  HCT 44.1  MCV 92.1  PLT 941   Basic Metabolic Panel: Recent Labs  Lab 09/30/22 1053  NA 135  K 3.9  CL 98  CO2 25  GLUCOSE 116*  BUN 36*  CREATININE 1.60*  CALCIUM 8.7*   GFR: Estimated Creatinine Clearance: 51.5 mL/min (A) (by C-G formula based on SCr of 1.6 mg/dL (H)).  Urine analysis:    Component Value Date/Time   COLORURINE YELLOW 08/04/2016 1053   APPEARANCEUR CLEAR 08/04/2016 1053   LABSPEC 1.021 08/04/2016 1053   PHURINE 5.5 08/04/2016 1053   GLUCOSEU NEGATIVE 08/04/2016 1053   HGBUR NEGATIVE 08/04/2016 1053   BILIRUBINUR NEGATIVE 08/04/2016 1053   KETONESUR NEGATIVE 08/04/2016 1053   PROTEINUR NEGATIVE 08/04/2016 1053   NITRITE NEGATIVE 08/04/2016 [place] 08/04/2016 1053   CRITICAL CARE Performed by: Dr. Tobie Poet  Total critical care time: 30 minutes  Critical care time was exclusive of separately billable procedures and treating other patients.  Critical care was necessary to treat or prevent imminent or life-threatening deterioration.  Critical care was time spent personally by me on the following activities: development of treatment plan with patient and/or surrogate as well as nursing, discussions with consultants, evaluation of patient's response to treatment, examination of patient, obtaining history from patient or surrogate, ordering and performing treatments and interventions, ordering and review of laboratory studies, ordering and review of radiographic studies, pulse oximetry and re-evaluation of patient's condition.  This document was prepared using Dragon Voice Recognition software and may include unintentional dictation errors.  Dr. Tobie Poet [facility]  If 7PM-7AM, please contact overnight-coverage provider If 7AM-7PM, please contact day coverage provider [email or web address]  09/30/2022, 3:28 PM

## 2022-09-30 NOTE — Assessment & Plan Note (Signed)
-   Presumed secondary to prerenal in setting of GI loss - Status post sodium chloride 1 L bolus.  I ordered additional LR 1 bolus on admission - Sodium chloride IVF at 125 mL/h, 15 hours ordered - Check BMP in the a.m.

## 2022-09-30 NOTE — ED Provider Notes (Signed)
[facility] Provider Note    Event Date/Time   First MD Initiated Contact with Patient 09/30/22 1106     (approximate)   History   Dizziness and Shortness of Breath   HPI  Paul Page is a 66 y.o. male with a history of asthma, CAD, hypertension, and hyperlipidemia who presents with shortness of breath over the last 2 days, gradual onset, associated with fevers at home as high as 101 and generalized weakness.  The patient denies any associated vomiting or diarrhea.  He has no chest pain.  He has no sick contacts.  The patient has a history of asthma and tried using an albuterol nebulizer with no significant relief.  I reviewed the past medical records.  The patient's most recent outpatient encounter was on 12/21 with podiatry for joint pain in the foot.  He was seen in the ED on 11/20 for a laceration.  He has no recent inpatient admissions.   Physical Exam   Triage Vital Signs: ED Triage Vitals  Enc Vitals Group     BP 09/30/22 1049 121/69     Pulse Rate 09/30/22 1049 79     Resp 09/30/22 1049 18     Temp 09/30/22 1049 98.2 F (36.8 C)     Temp Source 09/30/22 1049 Oral     SpO2 09/30/22 1049 (!) 85 %     Weight 09/30/22 1047 199 lb 15.3 oz (90.7 kg)     Height 09/30/22 1047 5\' 10"  (1.778 m)     Head Circumference --      Peak Flow --      Pain Score 09/30/22 1047 5     Pain Loc --      Pain Edu? --      Excl. in GC? --     Most recent vital signs: Vitals:   09/30/22 1400 09/30/22 1430  BP: 114/69 118/70  Pulse: 96 90  Resp: 20 (!) 22  Temp:    SpO2: 92% 94%     General: Alert and oriented, no acute distress. CV:  Good peripheral perfusion.  Normal heart sounds. Resp:  Increased respiratory effort.  Somewhat diminished breath sounds bilaterally.  A few faint scattered wheezes. Abd:  No distention.  Other:  No peripheral edema.  Dry mucous membranes.   ED Results / Procedures / Treatments   Labs (all labs ordered are listed,  but only abnormal results are displayed) Labs Reviewed  RESP PANEL BY RT-PCR (RSV, FLU A&B, COVID)  RVPGX2 - Abnormal; Notable for the following components:      Result Value   Influenza A by PCR POSITIVE (*)    All other components within normal limits  BASIC METABOLIC PANEL - Abnormal; Notable for the following components:   Glucose, Bld 116 (*)    BUN 36 (*)    Creatinine, Ser 1.60 (*)    Calcium 8.7 (*)    GFR, Estimated 47 (*)    All other components within normal limits  CULTURE, BLOOD (ROUTINE X 2)  CULTURE, BLOOD (ROUTINE X 2)  MRSA NEXT GEN BY PCR, NASAL  GASTROINTESTINAL PANEL BY PCR, STOOL (REPLACES STOOL CULTURE)  C DIFFICILE QUICK SCREEN W PCR REFLEX    CBC  LACTIC ACID, PLASMA  PROCALCITONIN     EKG  ED ECG REPORT I, 10/02/22, the attending physician, personally viewed and interpreted this ECG.  Date: 09/30/2022 EKG Time: 1051 Rate: 81 Rhythm: normal sinus rhythm QRS Axis: normal Intervals: normal ST/T  Wave abnormalities: normal Narrative Interpretation: no evidence of acute ischemia    RADIOLOGY  Chest x-ray: I independently viewed and interpreted the images; there is a left retrocardiac opacity concerning for pneumonia  PROCEDURES:  Critical Care performed: Yes, see critical care procedure note(s)  .Critical Care  Performed by: Arta Silence, MD Authorized by: Arta Silence, MD   Critical care provider statement:    Critical care time (minutes):  30   Critical care time was exclusive of:  Separately billable procedures and treating other patients   Critical care was necessary to treat or prevent imminent or life-threatening deterioration of the following conditions:  Respiratory failure   Critical care was time spent personally by me on the following activities:  Development of treatment plan with patient or surrogate, discussions with consultants, evaluation of patient's response to treatment, examination of patient,  ordering and review of laboratory studies, ordering and review of radiographic studies, ordering and performing treatments and interventions, pulse oximetry, re-evaluation of patient's condition, review of old charts and obtaining history from patient or surrogate   Care discussed with: admitting provider      MEDICATIONS ORDERED IN ED: Medications  acetaminophen (TYLENOL) tablet 650 mg (650 mg Oral Given 09/30/22 1342)    Or  acetaminophen (TYLENOL) suppository 650 mg ( Rectal See Alternative 09/30/22 1342)  ondansetron (ZOFRAN) tablet 4 mg (has no administration in time range)    Or  ondansetron (ZOFRAN) injection 4 mg (has no administration in time range)  cefTRIAXone (ROCEPHIN) 2 g in sodium chloride 0.9 % 100 mL IVPB (has no administration in time range)  azithromycin (ZITHROMAX) 500 mg in sodium chloride 0.9 % 250 mL IVPB (has no administration in time range)  enoxaparin (LOVENOX) injection 40 mg (has no administration in time range)  senna-docusate (Senokot-S) tablet 1 tablet (has no administration in time range)  hydrALAZINE (APRESOLINE) injection 5 mg (has no administration in time range)  ezetimibe (ZETIA) tablet 10 mg (has no administration in time range)  losartan (COZAAR) tablet 50 mg (has no administration in time range)  nebivolol (BYSTOLIC) tablet 5 mg (has no administration in time range)  rosuvastatin (CRESTOR) tablet 40 mg (has no administration in time range)  escitalopram (LEXAPRO) tablet 10 mg (has no administration in time range)  clopidogrel (PLAVIX) tablet 75 mg (75 mg Oral Given 09/30/22 1502)  fluticasone furoate-vilanterol (BREO ELLIPTA) 100-25 MCG/ACT 1 puff (has no administration in time range)  ipratropium-albuterol (DUONEB) 0.5-2.5 (3) MG/3ML nebulizer solution 3 mL (has no administration in time range)  guaiFENesin (MUCINEX) 12 hr tablet 600 mg (has no administration in time range)  0.9 %  sodium chloride infusion ( Intravenous New Bag/Given 09/30/22 1503)   cefTRIAXone (ROCEPHIN) 1 g in sodium chloride 0.9 % 100 mL IVPB (has no administration in time range)  sodium chloride 0.9 % bolus 1,000 mL (0 mLs Intravenous Stopped 09/30/22 1227)  albuterol (PROVENTIL) (2.5 MG/3ML) 0.083% nebulizer solution 2.5 mg (2.5 mg Nebulization Given 09/30/22 1137)  albuterol (PROVENTIL) (2.5 MG/3ML) 0.083% nebulizer solution 2.5 mg (2.5 mg Nebulization Given 09/30/22 1137)  cefTRIAXone (ROCEPHIN) 1 g in sodium chloride 0.9 % 100 mL IVPB (0 g Intravenous Stopped 09/30/22 1247)  azithromycin (ZITHROMAX) 500 mg in sodium chloride 0.9 % 250 mL IVPB (0 mg Intravenous Stopped 09/30/22 1409)  lactated ringers bolus 1,000 mL (1,000 mLs Intravenous New Bag/Given 09/30/22 1502)     IMPRESSION / MDM / [place] / ED COURSE  I reviewed the triage vital signs and the nursing  notes.  66 year old male with PMH as noted above presents with shortness of breath, cough, and fever over the last 2 days along with generalized weakness and malaise.  On arrival his O2 saturation was in the 80s but his other vital signs are normal.  Exam reveals some scattered wheezes and very dry mucous membranes.  Differential diagnosis includes, but is not limited to, bacterial pneumonia, COVID-19, influenza, other viral etiology, less likely cardiac cause.  The patient is on O2 by nasal cannula and he does not have significantly increased respiratory effort at this time.  We will obtain chest x-ray, lab workup including respiratory panel and lactate, give fluids and bronchodilators and reassess.  Patient's presentation is most consistent with acute presentation with potential threat to life or bodily function.  The patient is on the cardiac monitor to evaluate for evidence of arrhythmia and/or significant heart rate changes.   ----------------------------------------- 2:00 PM on 09/30/2022 -----------------------------------------  Respiratory panel is positive for influenza A.  Lab workup  is otherwise reassuring with no leukocytosis and a normal lactate.  Creatinine is mildly elevated.  Chest x-ray does show some possible opacities have ordered empiric antibiotics as well.  The patient's respiratory status remains stable.  We have switched him over to a high flow nasal cannula.  I consulted Dr. Tobie Poet from the hospitalist service; based on discussion she agrees to admit the patient.   FINAL CLINICAL IMPRESSION(S) / ED DIAGNOSES   Final diagnoses:  Influenza A  Acute respiratory failure with hypoxia ([place])     Rx / DC Orders   ED Discharge Orders     None        Note:  This document was prepared using Dragon voice recognition software and may include unintentional dictation errors.    Arta Silence, MD 09/30/22 [phone]

## 2022-09-30 NOTE — Assessment & Plan Note (Signed)
-   Maintenance inhaler Breo Ellipta 1 puff inhalation daily resumed

## 2022-09-30 NOTE — ED Triage Notes (Signed)
Pt here with dizziness, weakness, and dehydration per pt. Pt denies CP. Pt has been coughing up mucus. Pt also states he has diarrhea that has been getting worse.

## 2022-09-30 NOTE — Assessment & Plan Note (Addendum)
In patient with history of asthma, with exacerbation - Presumed multifactorial including secondary to community-acquired pneumonia in setting of recent influenza A infection - Antibiotic coverage for CAP and risk for MRSA coverage ordered: Azithromycin 500 mg IV daily, ceftriaxone 2 g IV daily, and vancomycin per pharmacy - Check MRSA by PCR - Continue oxygen supplementation to maintain SpO2 greater than 92%, flutter valve, incentive spirometry - DuoNebs 4 times daily, 4 doses ordered; AM team to order more as appropriate - No wheezing on physical exam, therefore steroids have not been initiated by myself - Admit to inpatient, telemetry medical

## 2022-09-30 NOTE — ED Notes (Signed)
Pt on 4L BNC with oxygen coming up to 92%.

## 2022-09-30 NOTE — Hospital Course (Addendum)
[name] is a 1 male with history of CAD, hypertension, hyperlipidemia, asthma, who presents emergency department for chief concerns of cough, fever, shortness of breath for 4 days.  Also having some nausea and diarrhea.  Initial vitals in the emergency department showed temperature of 98.2, respiration rate of 18, heart rate 79, blood pressure 121/69, SpO2 of 92% on 6 L nasal cannula and currently on 8 L high flow nasal cannula.  Serum sodium is 135, potassium 3.9, chloride 98, bicarb 25, BUN of 36, serum creatinine of 1.60, EGFR 47, nonfasting blood glucose of 116, WBC 7.4, hemoglobin 14.9, platelets of 176.  Lactic acid is 1.9. COVID/influenza B/RSV PCR were negative.  Patient tested positive for influenza A by PCR.   CXR with left retrocardiac opacity which is new and concerning for infiltrate/pneumonia.  Repeat imaging to ensure resolution was recommended  ED treatment: Albuterol nebulizer x 2, azithromycin 500 mg IV, ceftriaxone 1 g IV, sodium chloride 1 L bolus, acetaminophen 1 g.  12/31: Vitals stable, maximum temperature recorded was 100.7 over the past 24 hours.  Remains on 8 L of oxygen which has been down to 5, labs with improvement of procalcitonin to 13.41 from 23.46 yesterday.  CBC with decrease of hemoglobin to 11.8, all cell lines decreased, patient received IV fluid.  AKI resolved with repeat creatinine at 0.84 from 1.60.  C. difficile PCR and GI pathogen panel negative.  Preliminary blood cultures negative.  10/02/22: Vital stable, currently at 2 L of oxygen.  Remained afebrile over the past 24 hours.  Procalcitonin improving to appropriately.  Continued to have significant cough and exertional dyspnea.  Appetite remained poor.  1/2: Patient continued to have significant cough and becoming hypoxic with minor exertion. Continued to require up to 2 L of oxygen at rest.  Starting him on some steroid and hypertonic saline nebulizer.  Also added Tussionex for night as he was  unable to sleep due to excessive cough.  1/3.  Patient still hypoxic with ambulation down into the 80s.  Seeing if we can taper off oxygen while here.  Prednisone switched to Solu-Medrol. 1/4.  Patient hypoxic down to 88 with ambulation today.  Continue incentive spirometry.  Trying to get off oxygen.  Continue Solu-Medrol. 1/5.  Patient feeling much better.  Briefly dropped down to 88% with ambulation.  Better exercise tolerance.  Patient stable and wants to go home.  Discharged home on prednisone taper.

## 2022-09-30 NOTE — Assessment & Plan Note (Signed)
-   Presumed secondary to gastroenteritis in setting of influenza A - Status post sodium chloride 1 L bolus per EDP - I ordered additional LR 1 L bolus - And sodium chloride infusion at 125 mL/h, 15 hours ordered

## 2022-09-30 NOTE — Assessment & Plan Note (Addendum)
PPI ?

## 2022-09-30 NOTE — Assessment & Plan Note (Addendum)
-   Escitalopram 10 mg daily resumed ?

## 2022-09-30 NOTE — Assessment & Plan Note (Addendum)
-   Patient takes Plavix 75 mg daily, and missed the last couple of days due to feeling poorly - Plavix has been resumed on admission - Resumed losartan 50 mg daily, rosuvastatin 40 mg daily, ezetimibe 10 mg daily, nebivolol 5 mg daily

## 2022-09-30 NOTE — Assessment & Plan Note (Addendum)
-   Ceftriaxone 2 g IV daily, azithromycin 500 mg IV daily, and vancomycin per pharmacy as above - Recommend patient follow-up with outpatient PCP on discharge for appropriate reimaging to ensure resolution

## 2022-09-30 NOTE — Assessment & Plan Note (Addendum)
-   Patient takes losartan 50 mg daily, nebivolol 5 mg daily, resumed 10/01/2022 - Hydralazine 5 mg IV every 8 hours as needed for SBP greater than 180, 4 days ordered

## 2022-09-30 NOTE — Assessment & Plan Note (Addendum)
-   Patient takes rosuvastatin 40 mg daily, ezetimibe 10 mg p.o. daily were resumed - Vascepa not resumed on admission

## 2022-10-01 DIAGNOSIS — J9601 Acute respiratory failure with hypoxia: Secondary | ICD-10-CM | POA: Diagnosis not present

## 2022-10-01 DIAGNOSIS — E7800 Pure hypercholesterolemia, unspecified: Secondary | ICD-10-CM

## 2022-10-01 DIAGNOSIS — N179 Acute kidney failure, unspecified: Secondary | ICD-10-CM

## 2022-10-01 DIAGNOSIS — A419 Sepsis, unspecified organism: Secondary | ICD-10-CM

## 2022-10-01 DIAGNOSIS — F32A Depression, unspecified: Secondary | ICD-10-CM

## 2022-10-01 DIAGNOSIS — J189 Pneumonia, unspecified organism: Secondary | ICD-10-CM

## 2022-10-01 DIAGNOSIS — I10 Essential (primary) hypertension: Secondary | ICD-10-CM

## 2022-10-01 DIAGNOSIS — R197 Diarrhea, unspecified: Secondary | ICD-10-CM

## 2022-10-01 DIAGNOSIS — J4540 Moderate persistent asthma, uncomplicated: Secondary | ICD-10-CM

## 2022-10-01 DIAGNOSIS — I2510 Atherosclerotic heart disease of native coronary artery without angina pectoris: Secondary | ICD-10-CM

## 2022-10-01 LAB — CBC
HCT: 34.6 % — ABNORMAL LOW (ref 39.0–52.0)
Hemoglobin: 11.8 g/dL — ABNORMAL LOW (ref 13.0–17.0)
MCH: 31.3 pg (ref 26.0–34.0)
MCHC: 34.1 g/dL (ref 30.0–36.0)
MCV: 91.8 fL (ref 80.0–100.0)
Platelets: 153 10*3/uL (ref 150–400)
RBC: 3.77 MIL/uL — ABNORMAL LOW (ref 4.22–5.81)
RDW: 12.7 % (ref 11.5–15.5)
WBC: 5.8 10*3/uL (ref 4.0–10.5)
nRBC: 0 % (ref 0.0–0.2)

## 2022-10-01 LAB — BASIC METABOLIC PANEL
Anion gap: 6 (ref 5–15)
BUN: 20 mg/dL (ref 8–23)
CO2: 23 mmol/L (ref 22–32)
Calcium: 8 mg/dL — ABNORMAL LOW (ref 8.9–10.3)
Chloride: 110 mmol/L (ref 98–111)
Creatinine, Ser: 0.84 mg/dL (ref 0.61–1.24)
GFR, Estimated: >60 mL/min (ref >60)
Glucose, Bld: 115 mg/dL — ABNORMAL HIGH (ref 70–99)
Potassium: 3.7 mmol/L (ref 3.5–5.1)
Sodium: 139 mmol/L (ref 135–145)

## 2022-10-01 LAB — PROCALCITONIN: Procalcitonin: 13.41 ng/mL

## 2022-10-01 MED ORDER — AZITHROMYCIN 500 MG PO TABS
500.0000 mg | ORAL_TABLET | Freq: Every day | ORAL | Status: AC
  Administered 2022-10-02 – 2022-10-04 (×3): 500 mg via ORAL
  Filled 2022-10-01 (×3): qty 1

## 2022-10-01 NOTE — ED Notes (Signed)
Advised nurse that patient has ready bed 

## 2022-10-01 NOTE — Progress Notes (Signed)
PHARMACIST - PHYSICIAN COMMUNICATION  CONCERNING: Antibiotic IV to Oral Route Change Policy  RECOMMENDATION: This patient is receiving azithromycin by the intravenous route.  Based on criteria approved by the Pharmacy and Therapeutics Committee, the antibiotic(s) is/are being converted to the equivalent oral dose form(s).   DESCRIPTION: These criteria include: Patient being treated for a respiratory tract infection, urinary tract infection, cellulitis or clostridium difficile associated diarrhea if on metronidazole The patient is not neutropenic and does not exhibit a GI malabsorption state The patient is eating (either orally or via tube) and/or has been taking other orally administered medications for a least 24 hours The patient is improving clinically and has a Tmax < 100.5  If you have questions about this conversion, please contact the Pharmacy Department   [place] [place] B Mayur Duman 10/01/22    

## 2022-10-01 NOTE — Progress Notes (Signed)
Progress Note   Patient: Paul Page QZR:[id number] DOB: [birth year] DOA: 09/30/2022     1 DOS: the patient was seen and examined on 10/01/2022   Brief hospital course: [name] is a 70 male with history of CAD, hypertension, hyperlipidemia, asthma, who presents emergency department for chief concerns of cough, fever, shortness of breath for 4 days.  Also having some nausea and diarrhea.  Initial vitals in the emergency department showed temperature of 98.2, respiration rate of 18, heart rate 79, blood pressure 121/69, SpO2 of 92% on 6 L nasal cannula and currently on 8 L high flow nasal cannula.  Serum sodium is 135, potassium 3.9, chloride 98, bicarb 25, BUN of 36, serum creatinine of 1.60, EGFR 47, nonfasting blood glucose of 116, WBC 7.4, hemoglobin 14.9, platelets of 176.  Lactic acid is 1.9. COVID/influenza B/RSV PCR were negative.  Patient tested positive for influenza A by PCR.   CXR with left retrocardiac opacity which is new and concerning for infiltrate/pneumonia.  Repeat imaging to ensure resolution was recommended  ED treatment: Albuterol nebulizer x 2, azithromycin 500 mg IV, ceftriaxone 1 g IV, sodium chloride 1 L bolus, acetaminophen 1 g.  12/31: Vitals stable, maximum temperature recorded was 100.7 over the past 24 hours.  Remains on 8 L of oxygen which has been down to 5, labs with improvement of procalcitonin to 13.41 from 23.46 yesterday.  CBC with decrease of hemoglobin to 11.8, all cell lines decreased, patient received IV fluid.  AKI resolved with repeat creatinine at 0.84 from 1.60.  C. difficile PCR and GI pathogen panel negative.  Preliminary blood cultures negative.  Assessment and Plan: * Acute hypoxemic respiratory failure (HCC) In patient with history of asthma, with exacerbation - Presumed multifactorial including secondary to community-acquired pneumonia in setting of recent influenza A infection - Continue oxygen supplementation to maintain  SpO2 greater than 92%, flutter valve, incentive spirometry - DuoNebs 4 times daily, 4 doses ordered; AM team to order more as appropriate - No wheezing on physical exam, therefore steroids have not been initiated by myself   Sepsis due to pneumonia [facility]) Patient met sepsis criteria with fever, tachycardia and tachypnea.  Influenza a positive with concern of superadded bacterial pneumonia.  Markedly elevated procalcitonin which started trending down.  Preliminary blood cultures negative. MRSA PCR negative  CAP (community acquired pneumonia) Patient received ceftriaxone, Zithromax and vancomycin in ED.  Vancomycin was discontinued as MRSA PCR was negative.. -Continue ceftriaxone and Zithromax-complete a 5-day course -Patient will need outpatient repeat chest imaging in 3 to 4 weeks to see the resolution  AKI (acute kidney injury) ([place]) Resolved with IV fluid.  Diarrhea - Presumed secondary to gastroenteritis in setting of influenza A C. difficile PCR and GI pathogen panel negative -Continue with supportive care  Hypertension - Patient takes losartan 50 mg daily, nebivolol 5 mg daily, resumed 10/01/2022 - Hydralazine 5 mg IV every 8 hours as needed for SBP greater than 180, 4 days ordered  Moderate persistent asthma without complication - Maintenance inhaler Breo Ellipta 1 puff inhalation daily resumed  Coronary artery disease involving native coronary artery of native heart without angina pectoris - Patient takes Plavix 75 mg daily, and missed the last couple of days due to feeling poorly - Plavix has been resumed on admission - Resumed losartan 50 mg daily, rosuvastatin 40 mg daily, ezetimibe 10 mg daily, nebivolol 5 mg daily  Pure hypercholesterolemia - Patient takes rosuvastatin 40 mg daily, ezetimibe 10 mg p.o. daily were resumed -  Vascepa not resumed on admission  Depression - Escitalopram 10 mg daily resumed  GERD (gastroesophageal reflux disease) - PPI   Subjective:  Patient was feeling little short of breath and continued to have cough.  No appetite and p.o. intake remained poor.  Diarrhea improving but has not completely resolved yet.  Physical Exam: Vitals:   10/01/22 0100 10/01/22 0457 10/01/22 0800 10/01/22 0835  BP: 116/70 121/68 126/73   Pulse: 75 81 77 78  Resp: (!) 23 (!) 21 (!) 23 (!) 23  Temp:  99.1 F (37.3 C)  98.5 F (36.9 C)  TempSrc:  Axillary  Oral  SpO2: 97% 95% 93% 98%  Weight:      Height:       General.  Well-developed gentleman, in no acute distress. Pulmonary.  Few scattered rhonchi bilaterally, normal respiratory effort. CV.  Regular rate and rhythm, no JVD, rub or murmur. Abdomen.  Soft, nontender, nondistended, BS positive. CNS.  Alert and oriented .  No focal neurologic deficit. Extremities.  No edema, no cyanosis, pulses intact and symmetrical. Psychiatry.  Judgment and insight appears normal.   Data Reviewed: Prior data reviewed  Family Communication: Called wife with no response  Disposition: Status is: Inpatient Remains inpatient appropriate because: Severity of illness  Planned Discharge Destination: Home  DVT prophylaxis.  Lovenox Time spent: 50 minutes  This record has been created using Systems analyst. Errors have been sought and corrected,but may not always be located. Such creation errors do not reflect on the standard of care.   Author: Lorella Nimrod, MD 10/01/2022 3:14 PM  For on call review [email or web address].

## 2022-10-02 DIAGNOSIS — R197 Diarrhea, unspecified: Secondary | ICD-10-CM | POA: Diagnosis not present

## 2022-10-02 DIAGNOSIS — J189 Pneumonia, unspecified organism: Secondary | ICD-10-CM | POA: Diagnosis not present

## 2022-10-02 DIAGNOSIS — N179 Acute kidney failure, unspecified: Secondary | ICD-10-CM | POA: Diagnosis not present

## 2022-10-02 DIAGNOSIS — J9601 Acute respiratory failure with hypoxia: Secondary | ICD-10-CM | POA: Diagnosis not present

## 2022-10-02 LAB — CBC
HCT: 34.6 % — ABNORMAL LOW (ref 39.0–52.0)
Hemoglobin: 11.7 g/dL — ABNORMAL LOW (ref 13.0–17.0)
MCH: 31.2 pg (ref 26.0–34.0)
MCHC: 33.8 g/dL (ref 30.0–36.0)
MCV: 92.3 fL (ref 80.0–100.0)
Platelets: 180 10*3/uL (ref 150–400)
RBC: 3.75 MIL/uL — ABNORMAL LOW (ref 4.22–5.81)
RDW: 12.3 % (ref 11.5–15.5)
WBC: 6.1 10*3/uL (ref 4.0–10.5)
nRBC: 0 % (ref 0.0–0.2)

## 2022-10-02 LAB — BASIC METABOLIC PANEL
Anion gap: 9 (ref 5–15)
BUN: 17 mg/dL (ref 8–23)
CO2: 26 mmol/L (ref 22–32)
Calcium: 8.2 mg/dL — ABNORMAL LOW (ref 8.9–10.3)
Chloride: 104 mmol/L (ref 98–111)
Creatinine, Ser: 0.87 mg/dL (ref 0.61–1.24)
GFR, Estimated: >60 mL/min (ref >60)
Glucose, Bld: 98 mg/dL (ref 70–99)
Potassium: 3.6 mmol/L (ref 3.5–5.1)
Sodium: 139 mmol/L (ref 135–145)

## 2022-10-02 LAB — PROCALCITONIN: Procalcitonin: 6.81 ng/mL

## 2022-10-02 MED ORDER — DM-GUAIFENESIN ER 30-600 MG PO TB12: 1.0000 | ORAL_TABLET | Freq: Two times a day (BID) | ORAL | Status: DC

## 2022-10-02 MED ORDER — IPRATROPIUM-ALBUTEROL 0.5-2.5 (3) MG/3ML IN SOLN
3.0000 mL | Freq: Four times a day (QID) | RESPIRATORY_TRACT | Status: AC
  Administered 2022-10-02 – 2022-10-03 (×4): 3 mL via RESPIRATORY_TRACT
  Filled 2022-10-02 (×4): qty 3

## 2022-10-02 MED ORDER — HYDROCOD POLI-CHLORPHE POLI ER 10-8 MG/5ML PO SUER
5.0000 mL | Freq: Once | ORAL | Status: AC
  Administered 2022-10-02: 5 mL via ORAL
  Filled 2022-10-02: qty 5

## 2022-10-02 MED ORDER — BENZONATATE 100 MG PO CAPS
200.0000 mg | ORAL_CAPSULE | Freq: Three times a day (TID) | ORAL | Status: DC | PRN
  Administered 2022-10-02 – 2022-10-04 (×5): 200 mg via ORAL
  Filled 2022-10-02 (×5): qty 2

## 2022-10-02 NOTE — Plan of Care (Signed)

## 2022-10-02 NOTE — Progress Notes (Signed)
       CROSS COVER NOTE  NAME: Paul Page MRN: [id number] DOB : 1956/02/23 ATTENDING PHYSICIAN: Cox, Amy N, DO    Date of Service   10/02/2022   HPI/Events of Note   Medication request received for cough refractory to mucinex.  Interventions   Assessment/Plan: Tussionex      To reach the provider On-Call:   7AM- 7PM see care teams to locate the attending and reach out to them via [email or web address]. 7PM-7AM contact night-coverage If you still have difficulty reaching the appropriate provider, please page the [facility] (Director on Call) for [facility] on amion for assistance  This document was prepared using Set designer software and may include unintentional dictation errors.  Neomia Glass DNP, MBA, FNP-BC Nurse Practitioner [facility] Pager [phone]

## 2022-10-02 NOTE — Progress Notes (Signed)
Progress Note   Patient: Paul Page TMH:[id number] DOB: 06/24/1956 DOA: 09/30/2022     2 DOS: the patient was seen and examined on 10/02/2022   Brief hospital course: [name] is a 62 male with history of CAD, hypertension, hyperlipidemia, asthma, who presents emergency department for chief concerns of cough, fever, shortness of breath for 4 days.  Also having some nausea and diarrhea.  Initial vitals in the emergency department showed temperature of 98.2, respiration rate of 18, heart rate 79, blood pressure 121/69, SpO2 of 92% on 6 L nasal cannula and currently on 8 L high flow nasal cannula.  Serum sodium is 135, potassium 3.9, chloride 98, bicarb 25, BUN of 36, serum creatinine of 1.60, EGFR 47, nonfasting blood glucose of 116, WBC 7.4, hemoglobin 14.9, platelets of 176.  Lactic acid is 1.9. COVID/influenza B/RSV PCR were negative.  Patient tested positive for influenza A by PCR.   CXR with left retrocardiac opacity which is new and concerning for infiltrate/pneumonia.  Repeat imaging to ensure resolution was recommended  ED treatment: Albuterol nebulizer x 2, azithromycin 500 mg IV, ceftriaxone 1 g IV, sodium chloride 1 L bolus, acetaminophen 1 g.  12/31: Vitals stable, maximum temperature recorded was 100.7 over the past 24 hours.  Remains on 8 L of oxygen which has been down to 5, labs with improvement of procalcitonin to 13.41 from 23.46 yesterday.  CBC with decrease of hemoglobin to 11.8, all cell lines decreased, patient received IV fluid.  AKI resolved with repeat creatinine at 0.84 from 1.60.  C. difficile PCR and GI pathogen panel negative.  Preliminary blood cultures negative.  10/02/22: Vital stable, currently at 2 L of oxygen.  Remained afebrile over the past 24 hours.  Procalcitonin improving to appropriately.  Continued to have significant cough and exertional dyspnea.  Appetite remained poor.  Assessment and Plan: * Acute hypoxemic respiratory failure  (HCC) In patient with history of asthma, with exacerbation - Presumed multifactorial including secondary to community-acquired pneumonia in setting of recent influenza A infection - Continue oxygen supplementation to maintain SpO2 greater than 92%, flutter valve, incentive spirometry - DuoNebs 4 times daily, 4 doses ordered; AM team to order more as appropriate - No wheezing on physical exam, therefore steroids have not been initiated    Sepsis due to pneumonia [facility]) Patient met sepsis criteria with fever, tachycardia and tachypnea.  Influenza a positive with concern of superadded bacterial pneumonia.  Markedly elevated procalcitonin which started trending down.  Preliminary blood cultures negative. MRSA PCR negative  CAP (community acquired pneumonia) Patient received ceftriaxone, Zithromax and vancomycin in ED.  Vancomycin was discontinued as MRSA PCR was negative.. -Continue ceftriaxone and Zithromax-complete a 5-day course -Patient will need outpatient repeat chest imaging in 3 to 4 weeks to see the resolution  AKI (acute kidney injury) ([place]) Resolved with IV fluid.  Diarrhea - Presumed secondary to gastroenteritis in setting of influenza A C. difficile PCR and GI pathogen panel negative -Continue with supportive care  Hypertension - Patient takes losartan 50 mg daily, nebivolol 5 mg daily, resumed 10/01/2022 - Hydralazine 5 mg IV every 8 hours as needed for SBP greater than 180, 4 days ordered  Moderate persistent asthma without complication - Maintenance inhaler Breo Ellipta 1 puff inhalation daily resumed  Coronary artery disease involving native coronary artery of native heart without angina pectoris - Patient takes Plavix 75 mg daily, and missed the last couple of days due to feeling poorly - Plavix has been resumed on admission -  Resumed losartan 50 mg daily, rosuvastatin 40 mg daily, ezetimibe 10 mg daily, nebivolol 5 mg daily  Pure hypercholesterolemia - Patient  takes rosuvastatin 40 mg daily, ezetimibe 10 mg p.o. daily were resumed - Vascepa not resumed on admission  Depression - Escitalopram 10 mg daily resumed  GERD (gastroesophageal reflux disease) - PPI   Subjective: Patient continued to have cough and dyspnea with minor exertion like going to the bathroom.  Physical Exam: Vitals:   10/01/22 2138 10/02/22 0500 10/02/22 0759 10/02/22 0955  BP: 90/64 117/69 (!) 145/75   Pulse: 63 73 73   Resp: _0 Temp:  98.7 F (37.1 C) 97.8 F (36.6 C)   TempSrc:  Oral Oral   SpO2: 97% 96% 97% 95%  Weight:      Height:       General.  Well-developed gentleman, in no acute distress. Pulmonary.  Lungs clear bilaterally, normal respiratory effort. CV.  Regular rate and rhythm, no JVD, rub or murmur. Abdomen.  Soft, nontender, nondistended, BS positive. CNS.  Alert and oriented .  No focal neurologic deficit. Extremities.  No edema, no cyanosis, pulses intact and symmetrical. Psychiatry.  Judgment and insight appears normal.   Data Reviewed: Prior data reviewed  Family Communication: Discussed with wife at bedside  Disposition: Status is: Inpatient Remains inpatient appropriate because: Severity of illness  Planned Discharge Destination: Home  DVT prophylaxis.  Lovenox Time spent: 45 minutes  This record has been created using Systems analyst. Errors have been sought and corrected,but may not always be located. Such creation errors do not reflect on the standard of care.   Author: Lorella Nimrod, MD 10/02/2022 2:19 PM  For on call review [email or web address].

## 2022-10-02 NOTE — Assessment & Plan Note (Addendum)
Secondary to influenza A, pneumonia and asthma.  Pulse ox still dropping down to 88% with ambulation.  Repeat chest x-ray shows worsening of pneumonia right lower lobe and improvement in the left lower lobe.  CT scan of the chest negative for pulmonary embolism but did show pulmonary nodule.  Patient felt much better on 10/06/2022 and wants to go home.  Patient only briefly dropped down to 88% with ambulation and at rest is in the 90s.

## 2022-10-02 NOTE — Assessment & Plan Note (Signed)
Likely secondary to influenza A.

## 2022-10-02 NOTE — Assessment & Plan Note (Signed)
Resolved with IV fluid °

## 2022-10-03 DIAGNOSIS — R197 Diarrhea, unspecified: Secondary | ICD-10-CM | POA: Diagnosis not present

## 2022-10-03 DIAGNOSIS — J189 Pneumonia, unspecified organism: Secondary | ICD-10-CM | POA: Diagnosis not present

## 2022-10-03 DIAGNOSIS — N179 Acute kidney failure, unspecified: Secondary | ICD-10-CM | POA: Diagnosis not present

## 2022-10-03 DIAGNOSIS — J9601 Acute respiratory failure with hypoxia: Secondary | ICD-10-CM | POA: Diagnosis not present

## 2022-10-03 MED ORDER — ZINC OXIDE 40 % EX OINT: TOPICAL_OINTMENT | CUTANEOUS | Status: DC | PRN

## 2022-10-03 MED ORDER — PREDNISONE 50 MG PO TABS
50.0000 mg | ORAL_TABLET | Freq: Every day | ORAL | Status: DC
  Administered 2022-10-03 – 2022-10-04 (×2): 50 mg via ORAL
  Filled 2022-10-03 (×2): qty 1

## 2022-10-03 MED ORDER — IPRATROPIUM-ALBUTEROL 0.5-2.5 (3) MG/3ML IN SOLN
3.0000 mL | Freq: Four times a day (QID) | RESPIRATORY_TRACT | Status: DC
  Administered 2022-10-03: 3 mL via RESPIRATORY_TRACT
  Filled 2022-10-03: qty 3

## 2022-10-03 MED ORDER — SODIUM CHLORIDE 3 % IN NEBU
4.0000 mL | INHALATION_SOLUTION | Freq: Two times a day (BID) | RESPIRATORY_TRACT | Status: AC
  Administered 2022-10-03 – 2022-10-06 (×6): 4 mL via RESPIRATORY_TRACT
  Filled 2022-10-03 (×6): qty 4

## 2022-10-03 MED ORDER — IPRATROPIUM-ALBUTEROL 0.5-2.5 (3) MG/3ML IN SOLN
3.0000 mL | RESPIRATORY_TRACT | Status: DC
  Administered 2022-10-03 – 2022-10-06 (×16): 3 mL via RESPIRATORY_TRACT
  Filled 2022-10-03 (×3): qty 3
  Filled 2022-10-03: qty 6
  Filled 2022-10-03 (×11): qty 3

## 2022-10-03 MED ORDER — IPRATROPIUM-ALBUTEROL 0.5-2.5 (3) MG/3ML IN SOLN: 3.0000 mL | Freq: Four times a day (QID) | RESPIRATORY_TRACT | Status: DC

## 2022-10-03 MED ORDER — IPRATROPIUM-ALBUTEROL 0.5-2.5 (3) MG/3ML IN SOLN: 3.0000 mL | Freq: Two times a day (BID) | RESPIRATORY_TRACT | Status: DC

## 2022-10-03 MED ORDER — HYDROCOD POLI-CHLORPHE POLI ER 10-8 MG/5ML PO SUER
5.0000 mL | Freq: Every day | ORAL | Status: DC
  Administered 2022-10-03 – 2022-10-05 (×3): 5 mL via ORAL
  Filled 2022-10-03 (×3): qty 5

## 2022-10-03 MED ORDER — PREDNISONE 50 MG PO TABS: 50.0000 mg | ORAL_TABLET | Freq: Every day | ORAL | Status: DC

## 2022-10-03 NOTE — Progress Notes (Signed)
Progress Note   Patient: Paul Page HWE:[id number] DOB: 1956-10-01 DOA: 09/30/2022     3 DOS: the patient was seen and examined on 10/03/2022   Brief hospital course: [name] is a 62 male with history of CAD, hypertension, hyperlipidemia, asthma, who presents emergency department for chief concerns of cough, fever, shortness of breath for 4 days.  Also having some nausea and diarrhea.  Initial vitals in the emergency department showed temperature of 98.2, respiration rate of 18, heart rate 79, blood pressure 121/69, SpO2 of 92% on 6 L nasal cannula and currently on 8 L high flow nasal cannula.  Serum sodium is 135, potassium 3.9, chloride 98, bicarb 25, BUN of 36, serum creatinine of 1.60, EGFR 47, nonfasting blood glucose of 116, WBC 7.4, hemoglobin 14.9, platelets of 176.  Lactic acid is 1.9. COVID/influenza B/RSV PCR were negative.  Patient tested positive for influenza A by PCR.   CXR with left retrocardiac opacity which is new and concerning for infiltrate/pneumonia.  Repeat imaging to ensure resolution was recommended  ED treatment: Albuterol nebulizer x 2, azithromycin 500 mg IV, ceftriaxone 1 g IV, sodium chloride 1 L bolus, acetaminophen 1 g.  12/31: Vitals stable, maximum temperature recorded was 100.7 over the past 24 hours.  Remains on 8 L of oxygen which has been down to 5, labs with improvement of procalcitonin to 13.41 from 23.46 yesterday.  CBC with decrease of hemoglobin to 11.8, all cell lines decreased, patient received IV fluid.  AKI resolved with repeat creatinine at 0.84 from 1.60.  C. difficile PCR and GI pathogen panel negative.  Preliminary blood cultures negative.  10/02/22: Vital stable, currently at 2 L of oxygen.  Remained afebrile over the past 24 hours.  Procalcitonin improving to appropriately.  Continued to have significant cough and exertional dyspnea.  Appetite remained poor.  1/2: Patient continued to have significant cough and becoming  hypoxic with minor exertion. Continued to require up to 2 L of oxygen at rest.  Starting him on some steroid and hypertonic saline nebulizer.  Also added Tussionex for night as he was unable to sleep due to excessive cough.  Assessment and Plan: * Acute hypoxemic respiratory failure (HCC) In patient with history of asthma, with exacerbation - Presumed multifactorial including secondary to community-acquired pneumonia in setting of recent influenza A infection - Continue oxygen supplementation to maintain SpO2 greater than 92%, flutter valve, incentive spirometry - DuoNebs 4 times daily,  - Adding steroid for 3 days -Hypertonic saline nebulizer to help loosen up secretions -Chest PT   Sepsis due to pneumonia [facility]) Patient met sepsis criteria with fever, tachycardia and tachypnea.  Influenza a positive with concern of superadded bacterial pneumonia.  Markedly elevated procalcitonin which started trending down.  Preliminary blood cultures negative. MRSA PCR negative. -Continue ceftriaxone and Zithromax to complete a 5-day course  CAP (community acquired pneumonia) Patient received ceftriaxone, Zithromax and vancomycin in ED.  Vancomycin was discontinued as MRSA PCR was negative.. -Continue ceftriaxone and Zithromax-complete a 5-day course -Patient will need outpatient repeat chest imaging in 3 to 4 weeks to see the resolution  AKI (acute kidney injury) ([place]) Resolved with IV fluid.  Diarrhea Improved. - Presumed secondary to gastroenteritis in setting of influenza A C. difficile PCR and GI pathogen panel negative -Continue with supportive care  Hypertension - Patient takes losartan 50 mg daily, nebivolol 5 mg daily, resumed 10/01/2022 - Hydralazine 5 mg IV every 8 hours as needed for SBP greater than 180, 4 days ordered  Moderate persistent asthma without complication - Maintenance inhaler Breo Ellipta 1 puff inhalation daily resumed  Coronary artery disease involving native  coronary artery of native heart without angina pectoris - Patient takes Plavix 75 mg daily, and missed the last couple of days due to feeling poorly - Plavix has been resumed on admission - Resumed losartan 50 mg daily, rosuvastatin 40 mg daily, ezetimibe 10 mg daily, nebivolol 5 mg daily  Pure hypercholesterolemia - Patient takes rosuvastatin 40 mg daily, ezetimibe 10 mg p.o. daily were resumed - Vascepa not resumed on admission  Depression - Escitalopram 10 mg daily resumed  GERD (gastroesophageal reflux disease) - PPI   Subjective: Patient continued to have significant cough and shortness of breath which worsened with minor exertion and coughing.  Unable to sleep last night due to excessive coughing.  Physical Exam: Vitals:   10/03/22 0720 10/03/22 0825 10/03/22 1259 10/03/22 1543  BP:  (!) 155/82  131/76  Pulse:  69  73  Resp:  18  20  Temp:  97.7 F (36.5 C)  98.5 F (36.9 C)  TempSrc:  Oral    SpO2: 95% 93% 97% 92%  Weight:      Height:       General.  Well-developed gentleman, in no acute distress. Pulmonary.  Some scattered rhonchi bilaterally, normal respiratory effort. CV.  Regular rate and rhythm, no JVD, rub or murmur. Abdomen.  Soft, nontender, nondistended, BS positive. CNS.  Alert and oriented .  No focal neurologic deficit. Extremities.  No edema, no cyanosis, pulses intact and symmetrical. Psychiatry.  Judgment and insight appears normal.   Data Reviewed: Prior data reviewed  Family Communication: Discussed with patient  Disposition: Status is: Inpatient Remains inpatient appropriate because: Severity of illness  Planned Discharge Destination: Home  DVT prophylaxis.  Lovenox Time spent: 44 minutes  This record has been created using Systems analyst. Errors have been sought and corrected,but may not always be located. Such creation errors do not reflect on the standard of care.   Author: Lorella Nimrod, MD 10/03/2022 4:39  PM  For on call review [email or web address].

## 2022-10-03 NOTE — Assessment & Plan Note (Addendum)
Patient met sepsis criteria with fever, tachycardia and tachypnea.  Influenza a positive with concern of superimposed bacterial pneumonia.  Markedly elevated procalcitonin which has now trended down to 0.91.  Completed 5 days of Rocephin and Zithromax and will give 1 more day of Omnicef upon discharge.

## 2022-10-03 NOTE — Care Management Important Message (Signed)
Important Message  Patient Details  Name: Paul Page MRN: [id number] Date of Birth: 1956-10-01   Medicare Important Message Given:  N/A - LOS <3 / Initial given by admissions     [name] 10/03/2022, 8:44 AM

## 2022-10-03 NOTE — TOC Progression Note (Signed)
Transition of Care [facility]) - Progression Note    Patient Details  Name: Paul Page MRN: [id number] Date of Birth: [birth year]  Transition of [facility]) CM/SW Contact  Gerilyn Pilgrim, LCSW Phone Number: 10/03/2022, 4:15 PM  Clinical Narrative:   orders in for oxygen. [employer] to deliver to patient in AM.          Expected Discharge Plan and Services                                               Social Determinants of Health (SDOH) Interventions SDOH Screenings   Food Insecurity: No Food Insecurity (10/01/2022)  Housing: Low Risk  (10/01/2022)  Transportation Needs: No Transportation Needs (10/01/2022)  Utilities: Not At Risk (10/01/2022)  Alcohol Screen: Low Risk  (09/22/2019)  Depression (PHQ2-9): Low Risk  (08/11/2022)  Financial Resource Strain: Low Risk  (09/22/2019)  Physical Activity: Sufficiently Active (09/22/2019)  Social Connections: Moderately Integrated (09/22/2019)  Stress: No Stress Concern Present (09/22/2019)  Tobacco Use: Low Risk  (09/30/2022)    Readmission Risk Interventions     No data to display

## 2022-10-03 NOTE — Progress Notes (Addendum)
SATURATION QUALIFICATIONS: (This note is used to comply with regulatory documentation for home oxygen)  Patient Saturations on Room Air at Rest = 87%  Patient Saturations on Room Air while Ambulating = 84%  Patient Saturations on 3 Liters of oxygen while Ambulating = 92%

## 2022-10-04 DIAGNOSIS — J189 Pneumonia, unspecified organism: Secondary | ICD-10-CM | POA: Diagnosis not present

## 2022-10-04 DIAGNOSIS — N179 Acute kidney failure, unspecified: Secondary | ICD-10-CM | POA: Diagnosis not present

## 2022-10-04 DIAGNOSIS — R197 Diarrhea, unspecified: Secondary | ICD-10-CM | POA: Diagnosis not present

## 2022-10-04 DIAGNOSIS — K219 Gastro-esophageal reflux disease without esophagitis: Secondary | ICD-10-CM

## 2022-10-04 DIAGNOSIS — J9601 Acute respiratory failure with hypoxia: Secondary | ICD-10-CM | POA: Diagnosis not present

## 2022-10-04 MED ORDER — METHYLPREDNISOLONE SODIUM SUCC 40 MG IJ SOLR
40.0000 mg | Freq: Every day | INTRAMUSCULAR | Status: DC
  Administered 2022-10-04 – 2022-10-06 (×3): 40 mg via INTRAVENOUS
  Filled 2022-10-04 (×3): qty 1

## 2022-10-04 NOTE — Assessment & Plan Note (Addendum)
Continue nebulizers and Breo inhaler.  Prednisone taper.

## 2022-10-04 NOTE — Assessment & Plan Note (Signed)
Continue PPI ?

## 2022-10-04 NOTE — Assessment & Plan Note (Signed)
Continue Plavix, losartan, Crestor and Zetia and Bystolic.

## 2022-10-04 NOTE — Assessment & Plan Note (Addendum)
Patient slightly orthostatic and given a fluid bolus.  His losartan was held and Bystolic was cut in half.

## 2022-10-04 NOTE — Assessment & Plan Note (Signed)
-   Continue Crestor and Zetia 

## 2022-10-04 NOTE — Assessment & Plan Note (Addendum)
Lexapro 

## 2022-10-04 NOTE — Progress Notes (Signed)
Progress Note   Patient: Paul Page [id number] DOB: [birth year] DOA: 09/30/2022     4 DOS: the patient was seen and examined on 10/04/2022   Brief hospital course: Paul Page is a 14 male with history of CAD, hypertension, hyperlipidemia, asthma, who presents emergency department for chief concerns of cough, fever, shortness of breath for 4 days.  Also having some nausea and diarrhea.  Initial vitals in the emergency department showed temperature of 98.2, respiration rate of 18, heart rate 79, blood pressure 121/69, SpO2 of 92% on 6 L nasal cannula and currently on 8 L high flow nasal cannula.  Serum sodium is 135, potassium 3.9, chloride 98, bicarb 25, BUN of 36, serum creatinine of 1.60, EGFR 47, nonfasting blood glucose of 116, WBC 7.4, hemoglobin 14.9, platelets of 176.  Lactic acid is 1.9. COVID/influenza B/RSV PCR were negative.  Patient tested positive for influenza A by PCR.   CXR with left retrocardiac opacity which is new and concerning for infiltrate/pneumonia.  Repeat imaging to ensure resolution was recommended  ED treatment: Albuterol nebulizer x 2, azithromycin 500 mg IV, ceftriaxone 1 g IV, sodium chloride 1 L bolus, acetaminophen 1 g.  12/31: Vitals stable, maximum temperature recorded was 100.7 over the past 24 hours.  Remains on 8 L of oxygen which has been down to 5, labs with improvement of procalcitonin to 13.41 from 23.46 yesterday.  CBC with decrease of hemoglobin to 11.8, all cell lines decreased, patient received IV fluid.  AKI resolved with repeat creatinine at 0.84 from 1.60.  C. difficile PCR and GI pathogen panel negative.  Preliminary blood cultures negative.  10/02/22: Vital stable, currently at 2 L of oxygen.  Remained afebrile over the past 24 hours.  Procalcitonin improving to appropriately.  Continued to have significant cough and exertional dyspnea.  Appetite remained poor.  1/2: Patient continued to have significant cough and becoming  hypoxic with minor exertion. Continued to require up to 2 L of oxygen at rest.  Starting him on some steroid and hypertonic saline nebulizer.  Also added Tussionex for night as he was unable to sleep due to excessive cough.  1/3.  Patient still hypoxic with ambulation down into the 80s.  Seeing if we can taper off oxygen while here.  Assessment and Plan: * Acute hypoxemic respiratory failure ([place]) Secondary to influenza A, pneumonia and asthma.  Pulse ox still dropping down into the 80s with ambulation.  Will repeat chest x-ray tomorrow morning.  Continue nebulizer treatments.  Switch prednisone over to Solu-Medrol.  Since the patient does not wear oxygen at home I am still trying to get him off oxygen prior to disposition.  Sepsis due to pneumonia [facility]) Patient met sepsis criteria with fever, tachycardia and tachypnea.  Influenza a positive with concern of superimposed bacterial pneumonia.  Markedly elevated procalcitonin which started trending down.  Continue 5 days of Rocephin and Zithromax.  AKI (acute kidney injury) ([place]) Resolved with IV fluid.  Diarrhea Likely secondary to influenza A.  Hypertension Continue losartan and Bystolic  Moderate persistent asthma without complication Continue nebulizers and Breo inhaler.  Change prednisone to Solu-Medrol.  Coronary artery disease involving native coronary artery of native heart without angina pectoris Continue Plavix, losartan, Crestor and Zetia and Bystolic.  Pure hypercholesterolemia Continue Crestor and Zetia  Depression Lexapro  GERD (gastroesophageal reflux disease) Continue PPI        Subjective: Patient is starting to feel better since prednisone was added.  Still having some cough and short  of breath with minimal activity.  Feels fatigued with minimal activity.  Treating influenza A.  History of asthma.  Physical Exam: Vitals:   10/04/22 0744 10/04/22 1255 10/04/22 1626 10/04/22 1629  BP:   (!) 147/78   Pulse:  64 72 68 62  Resp: _0 Temp:   97.6 F (36.4 C)   TempSrc:      SpO2: 94% 92% 95% 94%  Weight:      Height:       Physical Exam HENT:     Head: Normocephalic.     Mouth/Throat:     Pharynx: No oropharyngeal exudate.  Eyes:     General: Lids are normal.     Conjunctiva/sclera: Conjunctivae normal.  Cardiovascular:     Rate and Rhythm: Normal rate and regular rhythm.     Heart sounds: Normal heart sounds, S1 normal and S2 normal.  Pulmonary:     Breath sounds: Examination of the right-lower field reveals decreased breath sounds and rhonchi. Examination of the left-lower field reveals decreased breath sounds and rhonchi. Decreased breath sounds and rhonchi present. No wheezing or rales.  Abdominal:     Palpations: Abdomen is soft.     Tenderness: There is no abdominal tenderness.  Musculoskeletal:     Right lower leg: No swelling.     Left lower leg: No swelling.  Skin:    General: Skin is warm.     Findings: No rash.  Neurological:     Mental Status: He is alert and oriented to person, place, and time.     Data Reviewed: Last hemoglobin 11.7, last procalcitonin 6.81, last creatinine 0.87   Disposition: Status is: Inpatient Remains inpatient appropriate because: Still dropping saturations down into the low 80s with ambulation.  Trying to see if we can get him off oxygen prior to disposition.  Planned Discharge Destination: Home    Time spent: 28 minutes  Author: Loletha Grayer, MD 10/04/2022 5:24 PM  For on call review [email or web address].

## 2022-10-04 NOTE — TOC Progression Note (Signed)
Transition of Care [facility]) - Progression Note    Patient Details  Name: Paul Page MRN: [id number] Date of Birth: Jul 23, 1956  Transition of Care [facility]) CM/SW Contact  Gerilyn Pilgrim, LCSW Phone Number: 10/04/2022, 11:06 AM  Clinical Narrative:   Oxygen canceled. Per MD, now the plan is not to have oxygen at home and to try to wean daily. [place] notified.          Expected Discharge Plan and Services                                               Social Determinants of Health (SDOH) Interventions SDOH Screenings   Food Insecurity: No Food Insecurity (10/01/2022)  Housing: Low Risk  (10/01/2022)  Transportation Needs: No Transportation Needs (10/01/2022)  Utilities: Not At Risk (10/01/2022)  Alcohol Screen: Low Risk  (09/22/2019)  Depression (PHQ2-9): Low Risk  (08/11/2022)  Financial Resource Strain: Low Risk  (09/22/2019)  Physical Activity: Sufficiently Active (09/22/2019)  Social Connections: Moderately Integrated (09/22/2019)  Stress: No Stress Concern Present (09/22/2019)  Tobacco Use: Low Risk  (09/30/2022)    Readmission Risk Interventions     No data to display

## 2022-10-05 ENCOUNTER — Inpatient Hospital Stay: Payer: Medicare Other

## 2022-10-05 DIAGNOSIS — J189 Pneumonia, unspecified organism: Secondary | ICD-10-CM | POA: Diagnosis not present

## 2022-10-05 DIAGNOSIS — J9601 Acute respiratory failure with hypoxia: Secondary | ICD-10-CM | POA: Diagnosis not present

## 2022-10-05 DIAGNOSIS — N179 Acute kidney failure, unspecified: Secondary | ICD-10-CM | POA: Diagnosis not present

## 2022-10-05 DIAGNOSIS — R911 Solitary pulmonary nodule: Secondary | ICD-10-CM

## 2022-10-05 DIAGNOSIS — R197 Diarrhea, unspecified: Secondary | ICD-10-CM | POA: Diagnosis not present

## 2022-10-05 LAB — CULTURE, BLOOD (ROUTINE X 2)
Culture: NO GROWTH
Culture: NO GROWTH
Special Requests: ADEQUATE

## 2022-10-05 LAB — CBC
HCT: 36.6 % — ABNORMAL LOW (ref 39.0–52.0)
Hemoglobin: 12.8 g/dL — ABNORMAL LOW (ref 13.0–17.0)
MCH: 31.7 pg (ref 26.0–34.0)
MCHC: 35 g/dL (ref 30.0–36.0)
MCV: 90.6 fL (ref 80.0–100.0)
Platelets: 323 10*3/uL (ref 150–400)
RBC: 4.04 MIL/uL — ABNORMAL LOW (ref 4.22–5.81)
RDW: 12.1 % (ref 11.5–15.5)
WBC: 5.6 10*3/uL (ref 4.0–10.5)
nRBC: 0 % (ref 0.0–0.2)

## 2022-10-05 LAB — BASIC METABOLIC PANEL
Anion gap: 11 (ref 5–15)
BUN: 21 mg/dL (ref 8–23)
CO2: 24 mmol/L (ref 22–32)
Calcium: 8.5 mg/dL — ABNORMAL LOW (ref 8.9–10.3)
Chloride: 104 mmol/L (ref 98–111)
Creatinine, Ser: 0.94 mg/dL (ref 0.61–1.24)
GFR, Estimated: >60 mL/min (ref >60)
Glucose, Bld: 151 mg/dL — ABNORMAL HIGH (ref 70–99)
Potassium: 3.2 mmol/L — ABNORMAL LOW (ref 3.5–5.1)
Sodium: 139 mmol/L (ref 135–145)

## 2022-10-05 LAB — PROCALCITONIN: Procalcitonin: 0.91 ng/mL

## 2022-10-05 LAB — D-DIMER, QUANTITATIVE: D-Dimer, Quant: 1.22 ug/mL-FEU — ABNORMAL HIGH (ref 0.00–0.50)

## 2022-10-05 MED ORDER — IOHEXOL 350 MG/ML SOLN
75.0000 mL | Freq: Once | INTRAVENOUS | Status: AC | PRN
  Administered 2022-10-05: 75 mL via INTRAVENOUS

## 2022-10-05 MED ORDER — CEFDINIR 300 MG PO CAPS
300.0000 mg | ORAL_CAPSULE | Freq: Two times a day (BID) | ORAL | Status: DC
  Administered 2022-10-05 – 2022-10-06 (×3): 300 mg via ORAL
  Filled 2022-10-05 (×3): qty 1

## 2022-10-05 MED ORDER — POTASSIUM CHLORIDE CRYS ER 20 MEQ PO TBCR
40.0000 meq | EXTENDED_RELEASE_TABLET | Freq: Once | ORAL | Status: AC
  Administered 2022-10-05: 40 meq via ORAL
  Filled 2022-10-05: qty 2

## 2022-10-05 NOTE — Progress Notes (Signed)
Progress Note   Patient: Paul Page MRN:[id number] DOB: 06/02/1956 DOA: 09/30/2022     5 DOS: the patient was seen and examined on 10/05/2022   Brief hospital course: [name] is a 66 male with history of CAD, hypertension, hyperlipidemia, asthma, who presents emergency department for chief concerns of cough, fever, shortness of breath for 4 days.  Also having some nausea and diarrhea.  Initial vitals in the emergency department showed temperature of 98.2, respiration rate of 18, heart rate 79, blood pressure 121/69, SpO2 of 92% on 6 L nasal cannula and currently on 8 L high flow nasal cannula.  Serum sodium is 135, potassium 3.9, chloride 98, bicarb 25, BUN of 36, serum creatinine of 1.60, EGFR 47, nonfasting blood glucose of 116, WBC 7.4, hemoglobin 14.9, platelets of 176.  Lactic acid is 1.9. COVID/influenza B/RSV PCR were negative.  Patient tested positive for influenza A by PCR.   CXR with left retrocardiac opacity which is new and concerning for infiltrate/pneumonia.  Repeat imaging to ensure resolution was recommended  ED treatment: Albuterol nebulizer x 2, azithromycin 500 mg IV, ceftriaxone 1 g IV, sodium chloride 1 L bolus, acetaminophen 1 g.  12/31: Vitals stable, maximum temperature recorded was 100.7 over the past 24 hours.  Remains on 8 L of oxygen which has been down to 5, labs with improvement of procalcitonin to 13.41 from 23.46 yesterday.  CBC with decrease of hemoglobin to 11.8, all cell lines decreased, patient received IV fluid.  AKI resolved with repeat creatinine at 0.84 from 1.60.  C. difficile PCR and GI pathogen panel negative.  Preliminary blood cultures negative.  10/02/22: Vital stable, currently at 2 L of oxygen.  Remained afebrile over the past 24 hours.  Procalcitonin improving to appropriately.  Continued to have significant cough and exertional dyspnea.  Appetite remained poor.  1/2: Patient continued to have significant cough and becoming  hypoxic with minor exertion. Continued to require up to 2 L of oxygen at rest.  Starting him on some steroid and hypertonic saline nebulizer.  Also added Tussionex for night as he was unable to sleep due to excessive cough.  1/3.  Patient still hypoxic with ambulation down into the 80s.  Seeing if we can taper off oxygen while here.  Prednisone switched to Solu-Medrol. 1/4.  Patient hypoxic down to 88 with ambulation today.  Continue incentive spirometry.  Trying to get off oxygen.  Continue Solu-Medrol.  Assessment and Plan: * Acute hypoxemic respiratory failure (HCC) Secondary to influenza A, pneumonia and asthma.  Pulse ox still dropping down to 88% with ambulation.  Repeat chest x-ray shows worsening of pneumonia right lower lobe and improvement in the left lower lobe.  CT scan of the chest negative for pulmonary embolism but did show pulmonary nodule.  Sepsis due to pneumonia (HCC) Patient met sepsis criteria with fever, tachycardia and tachypnea.  Influenza a positive with concern of superimposed bacterial pneumonia.  Markedly elevated procalcitonin which has now trended down to 0.91.  Completed 5 days of Rocephin and Zithromax and will give 2 days of Omnicef.  AKI (acute kidney injury) (HCC) Resolved with IV fluid.  Diarrhea Likely secondary to influenza A.  Hypertension Continue losartan and Bystolic.  Check orthostatic vital signs.  Moderate persistent asthma without complication Continue nebulizers and Breo inhaler.  Continue Solu-Medrol.  Coronary artery disease involving native coronary artery of native heart without angina pectoris Continue Plavix, losartan, Crestor and Zetia and Bystolic.  Pure hypercholesterolemia Continue Crestor and Zetia    Depression Lexapro  GERD (gastroesophageal reflux disease) Continue PPI  Pulmonary nodule Will need a repeat CT scan in 6 to 12 months to see if this disappears or remained stable or is growing.  This could be related to  infection.  Patient is a non-smoker.  8 x 4 mm in the right upper lobe.        Subjective: Patient still feels weak.  Still feels short of breath with ambulation.  Admitted with influenza and pneumonia.  Physical Exam: Vitals:   10/05/22 0326 10/05/22 0538 10/05/22 0731 10/05/22 0804  BP:  (!) 147/72  (!) 140/70  Pulse:  64  89  Resp:  18  17  Temp:  97.7 F (36.5 C)  (!) 97.4 F (36.3 C)  TempSrc:  Oral    SpO2: 93% 93% 93% 92%  Weight:      Height:       Physical Exam HENT:     Head: Normocephalic.     Mouth/Throat:     Pharynx: No oropharyngeal exudate.  Eyes:     General: Lids are normal.     Conjunctiva/sclera: Conjunctivae normal.  Cardiovascular:     Rate and Rhythm: Normal rate and regular rhythm.     Heart sounds: Normal heart sounds, S1 normal and S2 normal.  Pulmonary:     Breath sounds: Examination of the right-lower field reveals decreased breath sounds. Examination of the left-lower field reveals decreased breath sounds. Decreased breath sounds present. No wheezing, rhonchi or rales.  Abdominal:     Palpations: Abdomen is soft.     Tenderness: There is no abdominal tenderness.  Musculoskeletal:     Right lower leg: No swelling.     Left lower leg: No swelling.  Skin:    General: Skin is warm.     Findings: No rash.  Neurological:     Mental Status: He is alert and oriented to person, place, and time.     Data Reviewed: CT scan of the chest is negative for pulmonary embolism but did show a nodular density in the right upper lobe. Repeat chest x-ray shows progression of the pneumonia in the right lower lobe and improvement in the left lower lobe Procalcitonin down to 0.91, D-dimer 1.22, hemoglobin 12.8, white blood cell count 5.6, creatinine 0.94 and potassium 3.2  Family Communication: Spoke with patient's wife at the bedside  Disposition: Status is: Inpatient Remains inpatient appropriate because: Patient still dropping his pulse ox with  ambulation.  Continue IV Solu-Medrol.  Planned Discharge Destination: Home    Time spent: 28 minutes  Author: Loletha Grayer, MD 10/05/2022 12:54 PM  For on call review [email or web address].

## 2022-10-05 NOTE — Assessment & Plan Note (Addendum)
Will need a repeat CT scan in 6 to 12 months to see if this disappears or remains stable or is growing.  This could be related to infection.  Patient is a non-smoker.  8 x 4 mm in the right upper lobe.  Another option would be to obtain a PET CT scan.  I will refer to pulmonary as outpatient.

## 2022-10-06 DIAGNOSIS — J189 Pneumonia, unspecified organism: Secondary | ICD-10-CM | POA: Diagnosis not present

## 2022-10-06 DIAGNOSIS — J9601 Acute respiratory failure with hypoxia: Secondary | ICD-10-CM | POA: Diagnosis not present

## 2022-10-06 DIAGNOSIS — J101 Influenza due to other identified influenza virus with other respiratory manifestations: Secondary | ICD-10-CM | POA: Diagnosis not present

## 2022-10-06 DIAGNOSIS — N179 Acute kidney failure, unspecified: Secondary | ICD-10-CM | POA: Diagnosis not present

## 2022-10-06 MED ORDER — CEFDINIR 300 MG PO CAPS: 300.0000 mg | ORAL_CAPSULE | Freq: Two times a day (BID) | ORAL | 0 refills | Status: AC

## 2022-10-06 MED ORDER — NEBIVOLOL HCL 5 MG PO TABS: 2.5000 mg | ORAL_TABLET | Freq: Every day | ORAL | 1 refills | Status: DC

## 2022-10-06 MED ORDER — SODIUM CHLORIDE 0.9 % IV BOLUS
500.0000 mL | Freq: Once | INTRAVENOUS | Status: AC
  Administered 2022-10-06: 500 mL via INTRAVENOUS

## 2022-10-06 MED ORDER — PREDNISONE 10 MG PO TABS: ORAL_TABLET | ORAL | 0 refills | Status: DC

## 2022-10-06 MED ORDER — IPRATROPIUM-ALBUTEROL 0.5-2.5 (3) MG/3ML IN SOLN: 3.0000 mL | Freq: Four times a day (QID) | RESPIRATORY_TRACT | 0 refills | Status: DC | PRN

## 2022-10-06 MED ORDER — NEBIVOLOL HCL 5 MG PO TABS
2.5000 mg | ORAL_TABLET | Freq: Every day | ORAL | Status: DC
  Administered 2022-10-06: 2.5 mg via ORAL
  Filled 2022-10-06: qty 1

## 2022-10-06 NOTE — Care Management Important Message (Signed)
Important Message  Patient Details  Name: Paul Page MRN: [id number] Date of Birth: Nov 14, 1955   Medicare Important Message Given:  Other (see comment)  Patient is in an isolation room so I called his room [phone]) to review the Important Message from [employer] but there was no answer. Will try again.    [name] 10/06/2022, 10:47 AM

## 2022-10-06 NOTE — Care Management Important Message (Signed)
Important Message  Patient Details  Name: Paul Page MRN: [id number] Date of Birth: 11/14/1955   Medicare Important Message Given:  Other (see comment)  Called room again but patient had discharged.   [name] 10/06/2022, 11:02 AM

## 2022-10-06 NOTE — Discharge Summary (Signed)
Physician Discharge Summary   Patient: Paul Page MRN: [id number] DOB: 11/29/1955  Admit date:     09/30/2022  Discharge date: 10/06/22  Discharge Physician: Loletha Grayer   PCP: Steele Sizer, MD   Recommendations at discharge:   Follow-up PCP 5 days Referred to pulmonology as outpatient for pulmonary nodule  Discharge Diagnoses: Principal Problem:   Acute hypoxemic respiratory failure ([place]) Active Problems:   Sepsis due to pneumonia ([place])   AKI (acute kidney injury) ([place])   Diarrhea   Hypertension   Moderate persistent asthma without complication   Coronary artery disease involving native coronary artery of native heart without angina pectoris   Pure hypercholesterolemia   Depression   GERD (gastroesophageal reflux disease)   Pulmonary nodule    Hospital Course: Paul Page is a 67 male with history of CAD, hypertension, hyperlipidemia, asthma, who presents emergency department for chief concerns of cough, fever, shortness of breath for 4 days.  Also having some nausea and diarrhea.  Initial vitals in the emergency department showed temperature of 98.2, respiration rate of 18, heart rate 79, blood pressure 121/69, SpO2 of 92% on 6 L nasal cannula and currently on 8 L high flow nasal cannula.  Serum sodium is 135, potassium 3.9, chloride 98, bicarb 25, BUN of 36, serum creatinine of 1.60, EGFR 47, nonfasting blood glucose of 116, WBC 7.4, hemoglobin 14.9, platelets of 176.  Lactic acid is 1.9. COVID/influenza B/RSV PCR were negative.  Patient tested positive for influenza A by PCR.   CXR with left retrocardiac opacity which is new and concerning for infiltrate/pneumonia.  Repeat imaging to ensure resolution was recommended  ED treatment: Albuterol nebulizer x 2, azithromycin 500 mg IV, ceftriaxone 1 g IV, sodium chloride 1 L bolus, acetaminophen 1 g.  12/31: Vitals stable, maximum temperature recorded was 100.7 over the past 24 hours.  Remains on 8 L of  oxygen which has been down to 5, labs with improvement of procalcitonin to 13.41 from 23.46 yesterday.  CBC with decrease of hemoglobin to 11.8, all cell lines decreased, patient received IV fluid.  AKI resolved with repeat creatinine at 0.84 from 1.60.  C. difficile PCR and GI pathogen panel negative.  Preliminary blood cultures negative.  10/02/22: Vital stable, currently at 2 L of oxygen.  Remained afebrile over the past 24 hours.  Procalcitonin improving to appropriately.  Continued to have significant cough and exertional dyspnea.  Appetite remained poor.  1/2: Patient continued to have significant cough and becoming hypoxic with minor exertion. Continued to require up to 2 L of oxygen at rest.  Starting him on some steroid and hypertonic saline nebulizer.  Also added Tussionex for night as he was unable to sleep due to excessive cough.  1/3.  Patient still hypoxic with ambulation down into the 80s.  Seeing if we can taper off oxygen while here.  Prednisone switched to Solu-Medrol. 1/4.  Patient hypoxic down to 88 with ambulation today.  Continue incentive spirometry.  Trying to get off oxygen.  Continue Solu-Medrol. 1/5.  Patient feeling much better.  Briefly dropped down to 88% with ambulation.  Better exercise tolerance.  Patient stable and wants to go home.  Discharged home on prednisone taper.  Assessment and Plan: * Acute hypoxemic respiratory failure ([place]) Secondary to influenza A, pneumonia and asthma.  Pulse ox still dropping down to 88% with ambulation.  Repeat chest x-ray shows worsening of pneumonia right lower lobe and improvement in the left lower lobe.  CT scan of the  chest negative for pulmonary embolism but did show pulmonary nodule.  Patient felt much better on 10/06/2022 and wants to go home.  Patient only briefly dropped down to 88% with ambulation and at rest is in the 90s.  Sepsis due to pneumonia [facility](HCC) Patient met sepsis criteria with fever, tachycardia and tachypnea.   Influenza a positive with concern of superimposed bacterial pneumonia.  Markedly elevated procalcitonin which has now trended down to 0.91.  Completed 5 days of Rocephin and Zithromax and will give 1 more day of Omnicef upon discharge.  AKI (acute kidney injury) (HCC) Resolved with IV fluid.  Diarrhea Likely secondary to influenza A.  Hypertension Patient slightly orthostatic and given a fluid bolus.  His losartan was held and Bystolic was cut in half.  Moderate persistent asthma without complication Continue nebulizers and Breo inhaler.  Prednisone taper.  Coronary artery disease involving native coronary artery of native heart without angina pectoris Continue Plavix, losartan, Crestor and Zetia and Bystolic.  Pure hypercholesterolemia Continue Crestor and Zetia  Depression Lexapro  GERD (gastroesophageal reflux disease) Continue PPI  Pulmonary nodule Will need a repeat CT scan in 6 to 12 months to see if this disappears or remains stable or is growing.  This could be related to infection.  Patient is a non-smoker.  8 x 4 mm in the right upper lobe.  Another option would be to obtain a PET CT scan.  I will refer to pulmonary as outpatient.         Consultants: None Procedures performed: None Disposition: Home Diet recommendation:  Cardiac diet DISCHARGE MEDICATION: Allergies as of 10/06/2022       Reactions   Molds & Smuts Shortness Of Breath   Ambrosia Trifida (tall Ragweed) Allergy Skin Test    Fish Oil Hives   Gramineae Pollens    Peanut Oil    Tetracyclines & Related Other (See Comments)   Intolerance - stomach upset        Medication List     STOP taking these medications    losartan 50 MG tablet Commonly known as: COZAAR       TAKE these medications    albuterol 108 (90 Base) MCG/ACT inhaler Commonly known as: VENTOLIN HFA TAKE 2 PUFFS BY MOUTH EVERY 6 HOURS AS NEEDED FOR WHEEZE OR SHORTNESS OF BREATH   cefdinir 300 MG capsule Commonly  known as: OMNICEF Take 1 capsule (300 mg total) by mouth every 12 (twelve) hours for 1 day.   clopidogrel 75 MG tablet Commonly known as: PLAVIX Take 1 tablet (75 mg total) by mouth daily.   escitalopram 10 MG tablet Commonly known as: LEXAPRO Take 1 tablet (10 mg total) by mouth daily.   ezetimibe 10 MG tablet Commonly known as: ZETIA Take 1 tablet (10 mg total) by mouth daily.   fluticasone furoate-vilanterol 100-25 MCG/ACT Aepb Commonly known as: BREO ELLIPTA Inhale 1 puff into the lungs daily.   icosapent Ethyl 1 g capsule Commonly known as: Vascepa Take 2 capsules (2 g total) by mouth 2 (two) times daily.   ipratropium-albuterol 0.5-2.5 (3) MG/3ML Soln Commonly known as: DUONEB Take 3 mLs by nebulization every 6 (six) hours as needed (coughing fits, bronchitis following CAP with hx of asthma).   nebivolol 5 MG tablet Commonly known as: Bystolic Take 0.5 tablets (2.5 mg total) by mouth daily. Future refill request will need to be sent to new Cardiologist What changed: how much to take   predniSONE 10 MG tablet Commonly known as: DELTASONE  3 tabs po day1; 2 tabs po day 2,3; 1 tab po day4,5; 1/2 tab po day6,7   RABEprazole 20 MG tablet Commonly known as: ACIPHEX Take 1 tablet (20 mg total) by mouth daily.   rosuvastatin 40 MG tablet Commonly known as: CRESTOR Take 1 tablet (40 mg total) by mouth daily.        Follow-up Information     Alba Cory, MD Follow up in 5 day(s).   Specialty: Family Medicine Contact information: [address] [place] [address] [place] [address] [phone]         Salena Saner, MD Follow up in 1 month(s).   Specialty: Pulmonary Disease Why: pulmonary nodule Contact information: [address] [place] [address] [phone]                Discharge Exam: Ceasar Mons Weights   09/30/22 1047  Weight: 90.7 kg   Physical Exam HENT:     Head: Normocephalic.     Mouth/Throat:     Pharynx:  No oropharyngeal exudate.  Eyes:     General: Lids are normal.     Conjunctiva/sclera: Conjunctivae normal.  Cardiovascular:     Rate and Rhythm: Normal rate and regular rhythm.     Heart sounds: Normal heart sounds, S1 normal and S2 normal.  Pulmonary:     Breath sounds: Examination of the right-lower field reveals decreased breath sounds. Examination of the left-lower field reveals decreased breath sounds. Decreased breath sounds present. No wheezing, rhonchi or rales.  Abdominal:     Palpations: Abdomen is soft.     Tenderness: There is no abdominal tenderness.  Musculoskeletal:     Right lower leg: No swelling.     Left lower leg: No swelling.  Skin:    General: Skin is warm.     Findings: No rash.  Neurological:     Mental Status: He is alert and oriented to person, place, and time.      Condition at discharge: stable  The results of significant diagnostics from this hospitalization (including imaging, microbiology, ancillary and laboratory) are listed below for reference.   Imaging Studies: US Venous Img Lower Bilateral (DVT)  Result Date: 10/05/2022 CLINICAL DATA:  Bilateral lower extremity swelling. EXAM: BILATERAL LOWER EXTREMITY VENOUS DOPPLER ULTRASOUND TECHNIQUE: Gray-scale sonography with graded compression, as well as color Doppler and duplex ultrasound were performed to evaluate the lower extremity deep venous systems from the level of the common femoral vein and including the common femoral, femoral, profunda femoral, popliteal and calf veins including the posterior tibial, peroneal and gastrocnemius veins when visible. The superficial great saphenous vein was also interrogated. Spectral Doppler was utilized to evaluate flow at rest and with distal augmentation maneuvers in the common femoral, femoral and popliteal veins. COMPARISON:  None Available. FINDINGS: RIGHT LOWER EXTREMITY Common Femoral Vein: No evidence of thrombus. Normal compressibility, respiratory phasicity  and response to augmentation. Saphenofemoral Junction: No evidence of thrombus. Normal compressibility and flow on color Doppler imaging. Profunda Femoral Vein: No evidence of thrombus. Normal compressibility and flow on color Doppler imaging. Femoral Vein: No evidence of thrombus. Normal compressibility, respiratory phasicity and response to augmentation. Popliteal Vein: No evidence of thrombus. Normal compressibility, respiratory phasicity and response to augmentation. Calf Veins: No evidence of thrombus. Normal compressibility and flow on color Doppler imaging. Superficial Great Saphenous Vein: No evidence of thrombus. Normal compressibility. Venous Reflux:  None. Other Findings:  None. LEFT LOWER EXTREMITY Common Femoral Vein: No evidence of thrombus. Normal  compressibility, respiratory phasicity and response to augmentation. Saphenofemoral Junction: No evidence of thrombus. Normal compressibility and flow on color Doppler imaging. Profunda Femoral Vein: No evidence of thrombus. Normal compressibility and flow on color Doppler imaging. Femoral Vein: No evidence of thrombus. Normal compressibility, respiratory phasicity and response to augmentation. Popliteal Vein: No evidence of thrombus. Normal compressibility, respiratory phasicity and response to augmentation. Calf Veins: No evidence of thrombus. Normal compressibility and flow on color Doppler imaging. Superficial Great Saphenous Vein: No evidence of thrombus. Normal compressibility. Venous Reflux:  None. Other Findings:  None. IMPRESSION: No evidence of deep venous thrombosis in either lower extremity. Electronically Signed   By: Lupita Raider M.D.   On: 10/05/2022 15:22   CT Angio Chest Pulmonary Embolism (PE) W or WO Contrast  Result Date: 10/05/2022 CLINICAL DATA:  Shortness of breath, positive D-dimer. EXAM: CT ANGIOGRAPHY CHEST WITH CONTRAST TECHNIQUE: Multidetector CT imaging of the chest was performed using the standard protocol during bolus  administration of intravenous contrast. Multiplanar CT image reconstructions and MIPs were obtained to evaluate the vascular anatomy. RADIATION DOSE REDUCTION: This exam was performed according to the departmental dose-optimization program which includes automated exposure control, adjustment of the mA and/or kV according to patient size and/or use of iterative reconstruction technique. CONTRAST:  65mL OMNIPAQUE IOHEXOL 350 MG/ML SOLN COMPARISON:  None Available. FINDINGS: Cardiovascular: Satisfactory opacification of the pulmonary arteries to the segmental level. No evidence of pulmonary embolism. Normal heart size. No pericardial effusion. Mediastinum/Nodes: No enlarged mediastinal, hilar, or axillary lymph nodes. Thyroid gland, trachea, and esophagus demonstrate no significant findings. Lungs/Pleura: No pneumothorax is noted. Minimal bilateral pleural effusions are noted with adjacent subsegmental atelectasis or infiltrates. Ill-defined nodular densities are noted in the right upper lobe which may be inflammatory in etiology, the largest measuring 8 x 4 mm best seen on image 80 of series 6. Upper Abdomen: No acute abnormality. Musculoskeletal: No chest wall abnormality. No acute or significant osseous findings. Review of the MIP images confirms the above findings. IMPRESSION: No definite evidence of pulmonary embolus. Minimal bilateral pleural effusions are noted with adjacent lower lobe subsegmental atelectasis or infiltrates. Ill-defined nodular densities are noted in the right upper lobe which may be inflammatory in etiology but neoplasm cannot be excluded, the largest measuring 8 x 4 mm. Non-contrast chest CT at 6-12 months is recommended. If the nodule is stable at time of repeat CT, then future CT at 18-24 months (from today's scan) is considered optional for low-risk patients, but is recommended for high-risk patients. This recommendation follows the consensus statement: Guidelines for Management of  Incidental Pulmonary Nodules Detected on CT Images: From the Fleischner Society 2017; Radiology 2017; 284:228-243. Aortic Atherosclerosis (ICD10-I70.0). Electronically Signed   By: Lupita Raider M.D.   On: 10/05/2022 12:33   DG Chest Port 1 View  Result Date: 10/05/2022 CLINICAL DATA:  Hypoxia EXAM: PORTABLE CHEST 1 VIEW COMPARISON:  09/30/2022 FINDINGS: Mild progression of right lower lobe airspace disease. Improvement in left lower lobe airspace disease. Possible small left effusion Negative for heart failure or edema. Chronic right rib fractures. IMPRESSION: 1. Progression of right lower lobe airspace disease. Possible pneumonia. 2. Improvement in left lower lobe airspace disease.  Head Electronically Signed   By: Marlan Palau M.D.   On: 10/05/2022 08:05   DG Chest 2 View  Result Date: 09/30/2022 CLINICAL DATA:  Shortness of breath.  Cough. EXAM: CHEST - 2 VIEW COMPARISON:  March 09, 2022 FINDINGS: Left retrocardiac opacity is best seen  on the lateral view, new in the interval. The heart, hila, mediastinum are unremarkable. No pneumothorax. Healed right rib fractures. No nodules or masses. IMPRESSION: Left retrocardiac opacity is new in the interval and concerning for infiltrate/pneumonia. Recommend short-term follow-up imaging to ensure resolution. Electronically Signed   By: Gerome Sam III M.D.   On: 09/30/2022 11:28    Microbiology: Results for orders placed or performed during the hospital encounter of 09/30/22  Resp panel by RT-PCR (RSV, Flu A&B, Covid) Anterior Nasal Swab     Status: Abnormal   Collection Time: 09/30/22 10:53 AM   Specimen: Anterior Nasal Swab  Result Value Ref Range Status   SARS Coronavirus 2 by RT PCR NEGATIVE NEGATIVE Final    Comment: (NOTE) SARS-CoV-2 target nucleic acids are NOT DETECTED.  The SARS-CoV-2 RNA is generally detectable in upper respiratory specimens during the acute phase of infection. The lowest concentration of SARS-CoV-2 viral copies this  assay can detect is 138 copies/mL. A negative result does not preclude SARS-Cov-2 infection and should not be used as the sole basis for treatment or other patient management decisions. A negative result may occur with  improper specimen collection/handling, submission of specimen other than nasopharyngeal swab, presence of viral mutation(s) within the areas targeted by this assay, and inadequate number of viral copies(<138 copies/mL). A negative result must be combined with clinical observations, patient history, and epidemiological information. The expected result is Negative.  Fact Sheet for Patients:  BloggerCourse.com  Fact Sheet for Healthcare Providers:  SeriousBroker.it  This test is no t yet approved or cleared by the [place] FDA and  has been authorized for detection and/or diagnosis of SARS-CoV-2 by FDA under an Emergency Use Authorization (EUA). This EUA will remain  in effect (meaning this test can be used) for the duration of the COVID-19 declaration under Section 564(b)(1) of the Act, 21 U.S.C.section 360bbb-3(b)(1), unless the authorization is terminated  or revoked sooner.       Influenza A by PCR POSITIVE (A) NEGATIVE Final   Influenza B by PCR NEGATIVE NEGATIVE Final    Comment: (NOTE) The Xpert Xpress SARS-CoV-2/FLU/RSV plus assay is intended as an aid in the diagnosis of influenza from Nasopharyngeal swab specimens and should not be used as a sole basis for treatment. Nasal washings and aspirates are unacceptable for Xpert Xpress SARS-CoV-2/FLU/RSV testing.  Fact Sheet for Patients: BloggerCourse.com  Fact Sheet for Healthcare Providers: SeriousBroker.it  This test is not yet approved or cleared by the [place] FDA and has been authorized for detection and/or diagnosis of SARS-CoV-2 by FDA under an Emergency Use Authorization (EUA). This EUA  will remain in effect (meaning this test can be used) for the duration of the COVID-19 declaration under Section 564(b)(1) of the Act, 21 U.S.C. section 360bbb-3(b)(1), unless the authorization is terminated or revoked.     Resp Syncytial Virus by PCR NEGATIVE NEGATIVE Final    Comment: (NOTE) Fact Sheet for Patients: BloggerCourse.com  Fact Sheet for Healthcare Providers: SeriousBroker.it  This test is not yet approved or cleared by the [place] FDA and has been authorized for detection and/or diagnosis of SARS-CoV-2 by FDA under an Emergency Use Authorization (EUA). This EUA will remain in effect (meaning this test can be used) for the duration of the COVID-19 declaration under Section 564(b)(1) of the Act, 21 U.S.C. section 360bbb-3(b)(1), unless the authorization is terminated or revoked.  Performed at [facility], [address] [place], [place] [address]   Culture, blood (routine x  2)     Status: None   Collection Time: 09/30/22 11:38 AM   Specimen: Left Antecubital; Blood  Result Value Ref Range Status   Specimen Description LEFT ANTECUBITAL  Final   Special Requests   Final    BOTTLES DRAWN AEROBIC AND ANAEROBIC Blood Culture adequate volume   Culture   Final    NO GROWTH 5 DAYS Performed at [facility], [address] [place], [place] [address]    Report Status 10/05/2022 FINAL  Final  Culture, blood (routine x 2)     Status: None   Collection Time: 09/30/22 12:17 PM   Specimen: BLOOD  Result Value Ref Range Status   Specimen Description BLOOD RIGHT ANTECUBITAL  Final   Special Requests   Final    BOTTLES DRAWN AEROBIC AND ANAEROBIC Blood Culture results may not be optimal due to an inadequate volume of blood received in culture bottles   Culture   Final    NO GROWTH 5 DAYS Performed at [facility], [address] [place], [place] [address]    Report Status 10/05/2022  FINAL  Final  MRSA Next Gen by PCR, Nasal     Status: None   Collection Time: 09/30/22  3:09 PM   Specimen: Nasal Mucosa; Nasal Swab  Result Value Ref Range Status   MRSA by PCR Next Gen NOT DETECTED NOT DETECTED Final    Comment: (NOTE) The GeneXpert MRSA Assay (FDA approved for NASAL specimens only), is one component of a comprehensive MRSA colonization surveillance program. It is not intended to diagnose MRSA infection nor to guide or monitor treatment for MRSA infections. Test performance is not FDA approved in patients less than 70 years old. Performed at [facility], [address] [place], [place] [address]   Gastrointestinal Panel by PCR , Stool     Status: None   Collection Time: 09/30/22  3:09 PM   Specimen: Nasal Mucosa; Stool  Result Value Ref Range Status   Campylobacter species NOT DETECTED NOT DETECTED Final   Plesimonas shigelloides NOT DETECTED NOT DETECTED Final   Salmonella species NOT DETECTED NOT DETECTED Final   Yersinia enterocolitica NOT DETECTED NOT DETECTED Final   Vibrio species NOT DETECTED NOT DETECTED Final   Vibrio cholerae NOT DETECTED NOT DETECTED Final   Enteroaggregative E coli (EAEC) NOT DETECTED NOT DETECTED Final   Enteropathogenic E coli (EPEC) NOT DETECTED NOT DETECTED Final   Enterotoxigenic E coli (ETEC) NOT DETECTED NOT DETECTED Final   Shiga like toxin producing E coli (STEC) NOT DETECTED NOT DETECTED Final   Shigella/Enteroinvasive E coli (EIEC) NOT DETECTED NOT DETECTED Final   Cryptosporidium NOT DETECTED NOT DETECTED Final   Cyclospora cayetanensis NOT DETECTED NOT DETECTED Final   Entamoeba histolytica NOT DETECTED NOT DETECTED Final   Giardia lamblia NOT DETECTED NOT DETECTED Final   Adenovirus F40/41 NOT DETECTED NOT DETECTED Final   Astrovirus NOT DETECTED NOT DETECTED Final   Norovirus GI/GII NOT DETECTED NOT DETECTED Final   Rotavirus A NOT DETECTED NOT DETECTED Final   Sapovirus (I, II, IV, and V) NOT DETECTED  NOT DETECTED Final    Comment: Performed at [facility], [address] [place], [place] [address]  C Difficile Quick Screen w PCR reflex     Status: None   Collection Time: 09/30/22  3:09 PM   Specimen: Nasal Mucosa; Stool  Result Value Ref Range Status   C Diff antigen NEGATIVE NEGATIVE Final   C Diff toxin NEGATIVE  NEGATIVE Final   C Diff interpretation No C. difficile detected.  Final    Comment: Performed at [facility], [address] [place], [place] [address]    Labs: CBC: Recent Labs  Lab 09/30/22 1053 10/01/22 0441 10/02/22 0447 10/05/22 0600  WBC 7.4 5.8 6.1 5.6  HGB 14.9 11.8* 11.7* 12.8*  HCT 44.1 34.6* 34.6* 36.6*  MCV 92.1 91.8 92.3 90.6  PLT 176 153 180 323   Basic Metabolic Panel: Recent Labs  Lab 09/30/22 1053 10/01/22 0441 10/02/22 0447 10/05/22 0600  NA 135 139 139 139  K 3.9 3.7 3.6 3.2*  CL 98 110 104 104  CO2 25 23 26 24   GLUCOSE 116* 115* 98 151*  BUN 36* 20 17 21   CREATININE 1.60* 0.84 0.87 0.94  CALCIUM 8.7* 8.0* 8.2* 8.5*     Discharge time spent: greater than 30 minutes.  Signed: Alford Highlandichard Konstantine Gervasi, MD [facility] 10/06/2022

## 2022-10-06 NOTE — TOC Transition Note (Signed)
Transition of Care [facility]) - CM/SW Discharge Note   Patient Details  Name: Paul Page MRN: [id number] Date of Birth: [birth year]  [facility] CM/SW Contact:  Gerilyn Pilgrim, LCSW Phone Number: 10/06/2022, 10:22 AM   Clinical Narrative:   Pt has orders in to discharge. No needs currently patient has been weaned off of oxygen. CSW signing off.           Patient Goals and CMS Choice      Discharge Placement                         Discharge Plan and Services Additional resources added to the After Visit Summary for                                       Social Determinants of Health (SDOH) Interventions SDOH Screenings   Food Insecurity: No Food Insecurity (10/01/2022)  Housing: Low Risk  (10/01/2022)  Transportation Needs: No Transportation Needs (10/01/2022)  Utilities: Not At Risk (10/01/2022)  Alcohol Screen: Low Risk  (09/22/2019)  Depression (PHQ2-9): Low Risk  (08/11/2022)  Financial Resource Strain: Low Risk  (09/22/2019)  Physical Activity: Sufficiently Active (09/22/2019)  Social Connections: Moderately Integrated (09/22/2019)  Stress: No Stress Concern Present (09/22/2019)  Tobacco Use: Low Risk  (09/30/2022)     Readmission Risk Interventions     No data to display

## 2022-10-09 ENCOUNTER — Telehealth: Payer: Self-pay

## 2022-10-09 NOTE — Telephone Encounter (Signed)
Transition Care Management Unsuccessful Follow-up Telephone Call  Date of discharge and from where:  [facility] 10/06/2022  Attempts:  1st Attempt  Reason for unsuccessful TCM follow-up call:  Left voice message Juanda Crumble, [place] Direct Dial [phone]

## 2022-10-10 ENCOUNTER — Ambulatory Visit (INDEPENDENT_AMBULATORY_CARE_PROVIDER_SITE_OTHER): Payer: Medicare Other | Admitting: Physician Assistant

## 2022-10-10 ENCOUNTER — Encounter: Payer: Self-pay | Admitting: Physician Assistant

## 2022-10-10 ENCOUNTER — Ambulatory Visit
Admission: RE | Admit: 2022-10-10 | Discharge: 2022-10-10 | Disposition: A | Discharge status: Home / Self Care | Payer: Medicare Other | Source: Ambulatory Visit | Attending: Physician Assistant | Admitting: Physician Assistant

## 2022-10-10 ENCOUNTER — Ambulatory Visit
Admission: RE | Admit: 2022-10-10 | Discharge: 2022-10-10 | Disposition: A | Discharge status: Home / Self Care | Payer: Medicare Other | Attending: Physician Assistant | Admitting: Physician Assistant

## 2022-10-10 VITALS — BP 100/66 | HR 91 | Temp 97.6°F | Resp 16 | Ht 70.0 in | Wt 190.2 lb

## 2022-10-10 DIAGNOSIS — J9601 Acute respiratory failure with hypoxia: Secondary | ICD-10-CM

## 2022-10-10 DIAGNOSIS — R0600 Dyspnea, unspecified: Secondary | ICD-10-CM | POA: Diagnosis not present

## 2022-10-10 DIAGNOSIS — Z09 Encounter for follow-up examination after completed treatment for conditions other than malignant neoplasm: Secondary | ICD-10-CM | POA: Diagnosis not present

## 2022-10-10 DIAGNOSIS — I10 Essential (primary) hypertension: Secondary | ICD-10-CM

## 2022-10-10 DIAGNOSIS — R059 Cough, unspecified: Secondary | ICD-10-CM | POA: Diagnosis not present

## 2022-10-10 DIAGNOSIS — N179 Acute kidney failure, unspecified: Secondary | ICD-10-CM | POA: Diagnosis not present

## 2022-10-10 DIAGNOSIS — R0609 Other forms of dyspnea: Secondary | ICD-10-CM | POA: Insufficient documentation

## 2022-10-10 NOTE — Patient Instructions (Addendum)
I recommend that you continue to hold off your BP medications  Please continue to take the nebulizer treatments and inhalers for your breathing  I think we need to get another chest xray to make sure the pneumonia is resolving Continue to check your Oxygen at home and try to rest as much as possible  Make sure you are drinking plenty of water to help with your blood pressure- keep checking your blood pressure regularly to see if it is going back to it's normal readings

## 2022-10-10 NOTE — Assessment & Plan Note (Signed)
Chronic, concern for hypotension at this time Pt met Sepsis criteria in hospital and he reports continued low BP readings at home Recommend he continue to hold Losartan and Bystolic for now until he is feeling better Continue to monitor BP readings Follow up in 1 week to recheck BP status and potentially resume HTN regimen

## 2022-10-10 NOTE — Progress Notes (Signed)
Acute Office Visit   Patient: Paul Page   DOB: [birth year]   67 y.o. Male  MRN: [id number] Visit Date: 10/10/2022  Today's healthcare provider: Dani Gobble Corinthian Kemler, PA-C  Introduced myself to the patient as a Journalist, newspaper and provided education on APPs in clinical practice.    Chief Complaint  Patient presents with   Hospitalization Follow-up    Acute hypoxemic respiratory failure, had flu and pneumonia. Pt states is feeling lightheaded, at home BP reading are low concerned what can be causing it. Pt having difficulty breathing once he is active   Subjective    HPI HPI     Hospitalization Follow-up    Additional comments: Acute hypoxemic respiratory failure, had flu and pneumonia. Pt states is feeling lightheaded, at home BP reading are low concerned what can be causing it. Pt having difficulty breathing once he is active      Last edited by Salomon Fick, [place] on 10/10/2022  1:31 PM.       He reports continued productive cough with clear, "salty" sputum Reports DOE with any exertion and states BP has been low at home Reports his breathing is still very labored and he is not able to go about his normal day to day  He has been using his nebulizer every 4 hours or so  He is still taking his prednisone taper  He has been monitoring O2 sat at home - usually between 90-94  He reports lingering diarrhea  States his appetite is good at this time   Transition of [facility] Follow up.   Hospital/Facility: [facility] D/C Physician: Loletha Grayer, MD D/C Date: 10/06/22  Records Requested:  Records Received:  Records Reviewed: Hospital DC summary   Diagnoses on Discharge: Acute Hypoxemic Failure, sepsis due to pneumonia, AKI   Date of interactive Contact within 48 hours of discharge: caller was not able to contact patient for TOC interaction - left two voicemails Contact was through: phone  Date of 7 day or 14 day face-to-face visit:    within 7 days  Outpatient  Encounter Medications as of 10/10/2022  Medication Sig   albuterol (VENTOLIN HFA) 108 (90 Base) MCG/ACT inhaler TAKE 2 PUFFS BY MOUTH EVERY 6 HOURS AS NEEDED FOR WHEEZE OR SHORTNESS OF BREATH   clopidogrel (PLAVIX) 75 MG tablet Take 1 tablet (75 mg total) by mouth daily.   escitalopram (LEXAPRO) 10 MG tablet Take 1 tablet (10 mg total) by mouth daily.   ezetimibe (ZETIA) 10 MG tablet Take 1 tablet (10 mg total) by mouth daily.   fluticasone furoate-vilanterol (BREO ELLIPTA) 100-25 MCG/ACT AEPB Inhale 1 puff into the lungs daily.   icosapent Ethyl (VASCEPA) 1 g capsule Take 2 capsules (2 g total) by mouth 2 (two) times daily.   ipratropium-albuterol (DUONEB) 0.5-2.5 (3) MG/3ML SOLN Take 3 mLs by nebulization every 6 (six) hours as needed (coughing fits, bronchitis following CAP with hx of asthma).   nebivolol (BYSTOLIC) 5 MG tablet Take 0.5 tablets (2.5 mg total) by mouth daily. Future refill request will need to be sent to new Cardiologist   predniSONE (DELTASONE) 10 MG tablet 3 tabs po day1; 2 tabs po day 2,3; 1 tab po day4,5; 1/2 tab po day6,7   RABEprazole (ACIPHEX) 20 MG tablet Take 1 tablet (20 mg total) by mouth daily.   rosuvastatin (CRESTOR) 40 MG tablet Take 1 tablet (40 mg total) by mouth daily.   No facility-administered encounter medications on file as of  10/10/2022.    Diagnostic Tests Reviewed/Disposition: Labs, CXR, CT scan   Consults:None   Discharge Instructions: Patient was instructed to take inhalers and finish abx along with steroid taper  Disease/illness Education:   Home Health/Community Services Discussions/Referrals: Pulmonology follow up in one month   Establishment or re-establishment of referral orders for community resources: Pulmonology   Discussion with other health care providers: none   Assessment and Support of treatment regimen adherence:  Appointments Coordinated with:   Education for self-management, independent living, and ADLs:        Medications: Outpatient Medications Prior to Visit  Medication Sig   albuterol (VENTOLIN HFA) 108 (90 Base) MCG/ACT inhaler TAKE 2 PUFFS BY MOUTH EVERY 6 HOURS AS NEEDED FOR WHEEZE OR SHORTNESS OF BREATH   clopidogrel (PLAVIX) 75 MG tablet Take 1 tablet (75 mg total) by mouth daily.   escitalopram (LEXAPRO) 10 MG tablet Take 1 tablet (10 mg total) by mouth daily.   ezetimibe (ZETIA) 10 MG tablet Take 1 tablet (10 mg total) by mouth daily.   fluticasone furoate-vilanterol (BREO ELLIPTA) 100-25 MCG/ACT AEPB Inhale 1 puff into the lungs daily.   icosapent Ethyl (VASCEPA) 1 g capsule Take 2 capsules (2 g total) by mouth 2 (two) times daily.   ipratropium-albuterol (DUONEB) 0.5-2.5 (3) MG/3ML SOLN Take 3 mLs by nebulization every 6 (six) hours as needed (coughing fits, bronchitis following CAP with hx of asthma).   nebivolol (BYSTOLIC) 5 MG tablet Take 0.5 tablets (2.5 mg total) by mouth daily. Future refill request will need to be sent to new Cardiologist   predniSONE (DELTASONE) 10 MG tablet 3 tabs po day1; 2 tabs po day 2,3; 1 tab po day4,5; 1/2 tab po day6,7   RABEprazole (ACIPHEX) 20 MG tablet Take 1 tablet (20 mg total) by mouth daily.   rosuvastatin (CRESTOR) 40 MG tablet Take 1 tablet (40 mg total) by mouth daily.   No facility-administered medications prior to visit.    Review of Systems  Constitutional:  Positive for fatigue. Negative for chills and fever.  HENT:  Negative for congestion, postnasal drip and sore throat.   Respiratory:  Positive for cough and shortness of breath.   Neurological:  Positive for dizziness and light-headedness.       Objective    BP 100/66   Pulse 91   Temp 97.6 F (36.4 C) (Oral)   Resp 16   Ht 5\' 10"  (1.778 m)   Wt 190 lb 3.2 oz (86.3 kg)   SpO2 90%   BMI 27.29 kg/m    Physical Exam Vitals reviewed.  Constitutional:      General: He is awake.     Appearance: Normal appearance. He is well-developed, well-groomed and normal  weight.  HENT:     Head: Normocephalic and atraumatic.  Cardiovascular:     Rate and Rhythm: Normal rate and regular rhythm.     Pulses: Normal pulses.          Radial pulses are 2+ on the right side and 2+ on the left side.     Heart sounds: Normal heart sounds. No murmur heard.    No friction rub. No gallop.  Pulmonary:     Effort: No tachypnea, prolonged expiration, respiratory distress or retractions.     Breath sounds: No decreased air movement. Examination of the right-upper field reveals wheezing and rales. Examination of the right-middle field reveals wheezing and rales. Examination of the right-lower field reveals wheezing and rales. Examination of the left-lower field reveals  wheezing and rales. Wheezing and rales present. No decreased breath sounds.     Comments: Labored breathing with conversation  Wheezes and rales present -predominantly on right side that do not improve with coughing Musculoskeletal:     Cervical back: Normal range of motion and neck supple.     Right lower leg: No edema.     Left lower leg: No edema.  Lymphadenopathy:     Head:     Right side of head: No submental, submandibular or preauricular adenopathy.     Left side of head: No submental, submandibular or preauricular adenopathy.     Cervical:     Right cervical: No superficial or posterior cervical adenopathy.    Left cervical: No superficial or posterior cervical adenopathy.     Upper Body:     Right upper body: No supraclavicular adenopathy.     Left upper body: No supraclavicular adenopathy.  Neurological:     General: No focal deficit present.     Mental Status: He is alert and oriented to person, place, and time.     GCS: GCS eye subscore is 4. GCS verbal subscore is 5. GCS motor subscore is 6.     Cranial Nerves: No dysarthria or facial asymmetry.     Gait: Gait is intact.  Psychiatric:        Attention and Perception: Attention and perception normal.        Mood and Affect: Mood and  affect normal.        Speech: Speech normal.        Behavior: Behavior normal. Behavior is cooperative.       No results found for any visits on 10/10/22.  Assessment & Plan      Return in about 1 week (around 10/17/2022) for DOE, low BP, recovery from pneumonia .      Problem List Items Addressed This Visit       Cardiovascular and Mediastinum   Hypertension    Chronic, concern for hypotension at this time Pt met Sepsis criteria in hospital and he reports continued low BP readings at home Recommend he continue to hold Losartan and Bystolic for now until he is feeling better Continue to monitor BP readings Follow up in 1 week to recheck BP status and potentially resume HTN regimen        Respiratory   Acute hypoxemic respiratory failure (HCC)    Acute, appears resolved at this time  Pt is still having SOB and DOE as of today  Will recheck CXR and CBC for monitoring Follow up next week to track and make sure he is recovering       Relevant Orders   DG Chest 2 View     Genitourinary   AKI (acute kidney injury) (HCC)    Acute, resolved per Hospital dc summary Will recheck CMP to make sure this is resolved  Recommend he continue to stay well hydrated  Follow up as indicated by results.       Relevant Orders   COMPLETE METABOLIC PANEL WITH GFR     Other   DOE (dyspnea on exertion)    Acute, lingering SOB and DOE  Reports DOE even with minimal exertion compared to what he is used to  He has been using nebulizer treatments approx every 4 hours and is monitoring O2 sat at home - has been between 90-94% since dc  He reports productive cough with clear, salty sputum that is lingering as well Reviewed CXR from  10/05/22 and there was lingering right sided consolidation  Recommend he continue with prednisone taper and nebulizer  Will recheck CXR for monitoring May need another round of oral abx for pneumonia clearance Follow up in 1 week to make sure he is improving        Other Visit Diagnoses     Hospital discharge follow-up    -  Primary   Relevant Orders   COMPLETE METABOLIC PANEL WITH GFR   CBC w/Diff/Platelet        Return in about 1 week (around 10/17/2022) for DOE, low BP, recovery from pneumonia .   I, Malie Kashani E Mckinsley Koelzer, PA-C, have reviewed all documentation for this visit. The documentation on 10/10/22 for the exam, diagnosis, procedures, and orders are all accurate and complete.   Jacquelin Hawking, MHS, PA-C [facility]

## 2022-10-10 NOTE — Telephone Encounter (Signed)
Transition Care Management Unsuccessful Follow-up Telephone Call  Date of discharge and from where:  [place] 10/06/2022  Attempts:  2nd Attempt  Reason for unsuccessful TCM follow-up call:  Left voice message Juanda Crumble, [place] Direct Dial [phone]

## 2022-10-10 NOTE — Assessment & Plan Note (Signed)
Acute, lingering SOB and DOE  Reports DOE even with minimal exertion compared to what he is used to  He has been using nebulizer treatments approx every 4 hours and is monitoring O2 sat at home - has been between 90-94% since dc  He reports productive cough with clear, salty sputum that is lingering as well Reviewed CXR from 10/05/22 and there was lingering right sided consolidation  Recommend he continue with prednisone taper and nebulizer  Will recheck CXR for monitoring May need another round of oral abx for pneumonia clearance Follow up in 1 week to make sure he is improving

## 2022-10-10 NOTE — Assessment & Plan Note (Signed)
Acute, appears resolved at this time  Pt is still having SOB and DOE as of today  Will recheck CXR and CBC for monitoring Follow up next week to track and make sure he is recovering

## 2022-10-10 NOTE — Assessment & Plan Note (Signed)
Acute, resolved per Hospital dc summary Will recheck CMP to make sure this is resolved  Recommend he continue to stay well hydrated  Follow up as indicated by results.

## 2022-10-11 ENCOUNTER — Encounter: Payer: Self-pay | Admitting: Physician Assistant

## 2022-10-11 LAB — COMPLETE METABOLIC PANEL WITH GFR
AG Ratio: 1.6 (calc) (ref 1.0–2.5)
ALT: 94 U/L — ABNORMAL HIGH (ref 9–46)
AST: 33 U/L (ref 10–35)
Albumin: 4.1 g/dL (ref 3.6–5.1)
Alkaline phosphatase (APISO): 66 U/L (ref 35–144)
BUN/Creatinine Ratio: 27 (calc) — ABNORMAL HIGH (ref 6–22)
BUN: 32 mg/dL — ABNORMAL HIGH (ref 7–25)
CO2: 28 mmol/L (ref 20–32)
Calcium: 9.5 mg/dL (ref 8.6–10.3)
Chloride: 101 mmol/L (ref 98–110)
Creat: 1.18 mg/dL (ref 0.70–1.35)
Globulin: 2.5 g/dL (calc) (ref 1.9–3.7)
Glucose, Bld: 83 mg/dL (ref 65–99)
Potassium: 5 mmol/L (ref 3.5–5.3)
Sodium: 139 mmol/L (ref 135–146)
Total Bilirubin: 0.5 mg/dL (ref 0.2–1.2)
Total Protein: 6.6 g/dL (ref 6.1–8.1)
eGFR: 68 mL/min/{1.73_m2} (ref >=60)

## 2022-10-11 LAB — CBC WITH DIFFERENTIAL/PLATELET
Absolute Monocytes: 650 cells/uL (ref 200–950)
Basophils Absolute: 46 cells/uL (ref 0–200)
Basophils Relative: 0.4 %
Eosinophils Absolute: 34 cells/uL (ref 15–500)
Eosinophils Relative: 0.3 %
HCT: 42.2 % (ref 38.5–50.0)
Hemoglobin: 14.6 g/dL (ref 13.2–17.1)
Lymphs Abs: 821 cells/uL — ABNORMAL LOW (ref 850–3900)
MCH: 32.4 pg (ref 27.0–33.0)
MCHC: 34.6 g/dL (ref 32.0–36.0)
MCV: 93.6 fL (ref 80.0–100.0)
MPV: 9.4 fL (ref 7.5–12.5)
Monocytes Relative: 5.7 %
Neutro Abs: 9850 cells/uL — ABNORMAL HIGH (ref 1500–7800)
Neutrophils Relative %: 86.4 %
Platelets: 485 10*3/uL — ABNORMAL HIGH (ref 140–400)
RBC: 4.51 10*6/uL (ref 4.20–5.80)
RDW: 12.7 % (ref 11.0–15.0)
Total Lymphocyte: 7.2 %
WBC: 11.4 10*3/uL — ABNORMAL HIGH (ref 3.8–10.8)

## 2022-10-11 NOTE — Telephone Encounter (Signed)
Transition Care Management Unsuccessful Follow-up Telephone Call  Date of discharge and from where:  [place] 10/06/2022  Attempts:  3rd Attempt  Reason for unsuccessful TCM follow-up call:  Left voice message Juanda Crumble, [place] Direct Dial [phone]

## 2022-10-16 NOTE — Progress Notes (Unsigned)
Name: Paul Page   MRN: [id number]    DOB: June 05, 1956   Date:10/17/2022       Progress Note  Subjective  Chief Complaint  Follow Up  HPI  HTN: taking medication, denies chest pain or palpitation. BP today is towards low end of normal , ARB was stopped , taking Bystolic half dose and bp is still a little low, he lost a lot of weight since recent hospital stay    GERD: doing well on medication, no heart burn or indigestion. He is still taking Aciphex daily because when he skips medication symptoms returns  Malnutrition: he lost 27 lbs since May 2023, he thinks most of the weight over the past 2 weeks when he was sick and hospitalized    Asthma: he is taking Breo He has high allergy to pine ( cleaners/pine needles...) , it triggers his allergies and asthma. He is having a dry cough, SOB with activity since hospital stay in January.  He has follow up with pulmonologist in the next couple of weeks.    Hyperlipidemia/Atherosclerosis of aorta : taking  Crestor,  Zetia, Vascepa.. He has CAD but denies chest pain , he has SOB with activity since recent hospital stay for CPA and hypoxia    CAD: s/p stent placement in 2008 and  last one in 2011 he was under the care of Dr Fletcher Anon but is now seeing Dr Gennette Pac in [place] . He is on statin, vascepa,  Plavix, zetia  Compliant with cardiologist visits, he is off ARB due to low blood pressure during recent admission, still taking low dose bystolic    Hyperglycemia: last hgbA1C was back to normal. He  denies polyphagia, polydipsia or polyuria. We will recheck it yearly    Anxiety: taking Lexapro, has to take it at night because it makes him feel tired, controlling symptoms, he is feeling tired due to recent hospital stay but coping with finding of abnormal CT lung    History of CAP : April  2023, he had a severe episode of flu and CAP and admitted Dec 2023, he is still having some SOB with activity. He was found to have a right lung nodule  inflammatory or lung cancer. He has a follow up in a couple of weeks with Dr. Duwayne Heck at the end of this month. He still has SOB with activity, occasional cough, taking Breo as prescribed.   Patient Active Problem List   Diagnosis Date Noted   DOE (dyspnea on exertion) 10/10/2022   Influenza A 10/06/2022   Pulmonary nodule 10/05/2022   Acute hypoxemic respiratory failure ([facility]) 09/30/2022   Depression 09/30/2022   Diarrhea 09/30/2022   AKI (acute kidney injury) ([place]) 09/30/2022   Sepsis due to pneumonia ([place]) 09/30/2022   History of heart artery stent 02/07/2022   History of pneumonia 02/07/2022   Moderate persistent asthma without complication 40/98/1191   History of shingles 02/07/2022   Fever blister 02/07/2022   Hypertension 04/12/2015   GERD (gastroesophageal reflux disease) 03/22/2015   Anxiety 03/22/2015   Pure hypercholesterolemia 12/10/2012   Family history of ischemic heart disease (IHD) 10/24/2012   History of fracture of rib 09/04/2011   Coronary artery disease involving native coronary artery of native heart without angina pectoris 08/01/2007    Past Surgical History:  Procedure Laterality Date   [place]  2008   CORONARY STENT PLACEMENT  2008 & 2011    Family  History  Problem Relation Age of Onset   Parkinson's disease Mother    Cancer Father     Social History   Tobacco Use   Smoking status: Never   Smokeless tobacco: Never  Substance Use Topics   Alcohol use: Yes    Alcohol/week: 0.0 standard drinks of alcohol    Comment: occasional wine     Current Outpatient Medications:    albuterol (VENTOLIN HFA) 108 (90 Base) MCG/ACT inhaler, TAKE 2 PUFFS BY MOUTH EVERY 6 HOURS AS NEEDED FOR WHEEZE OR SHORTNESS OF BREATH, Disp: 8.5 each, Rfl: 1   clopidogrel (PLAVIX) 75 MG tablet, Take 1 tablet (75 mg total) by mouth daily., Disp: 90 tablet, Rfl: 1   escitalopram (LEXAPRO) 10 MG  tablet, Take 1 tablet (10 mg total) by mouth daily., Disp: 90 tablet, Rfl: 1   ezetimibe (ZETIA) 10 MG tablet, Take 1 tablet (10 mg total) by mouth daily., Disp: 90 tablet, Rfl: 1   fluticasone furoate-vilanterol (BREO ELLIPTA) 100-25 MCG/ACT AEPB, Inhale 1 puff into the lungs daily., Disp: 90 each, Rfl: 3   icosapent Ethyl (VASCEPA) 1 g capsule, Take 2 capsules (2 g total) by mouth 2 (two) times daily., Disp: 480 capsule, Rfl: 1   ipratropium-albuterol (DUONEB) 0.5-2.5 (3) MG/3ML SOLN, Take 3 mLs by nebulization every 6 (six) hours as needed (coughing fits, bronchitis following CAP with hx of asthma)., Disp: 360 mL, Rfl: 0   nebivolol (BYSTOLIC) 5 MG tablet, Take 0.5 tablets (2.5 mg total) by mouth daily. Future refill request will need to be sent to new Cardiologist, Disp: 90 tablet, Rfl: 1   predniSONE (DELTASONE) 10 MG tablet, 3 tabs po day1; 2 tabs po day 2,3; 1 tab po day4,5; 1/2 tab po day6,7, Disp: 10 tablet, Rfl: 0   RABEprazole (ACIPHEX) 20 MG tablet, Take 1 tablet (20 mg total) by mouth daily., Disp: 90 tablet, Rfl: 1   rosuvastatin (CRESTOR) 40 MG tablet, Take 1 tablet (40 mg total) by mouth daily., Disp: 90 tablet, Rfl: 1  Allergies  Allergen Reactions   Molds & Smuts Shortness Of Breath   Ambrosia Trifida (Tall Ragweed) Allergy Skin Test    Fish Oil Hives   Gramineae Pollens    Peanut Oil    Tetracyclines & Related Other (See Comments)    Intolerance - stomach upset    I personally reviewed active problem list, medication list, allergies, family history, social history, health maintenance with the patient/caregiver today.   ROS  Constitutional: Negative for fever , positive for weight change - 27 pounds since May 2023 - most of the weight loss while hospitalized .  Respiratory: positive for mild cough , positive for  shortness of breath with activity .   Cardiovascular: Negative for chest pain or palpitations.  Gastrointestinal: Negative for abdominal pain, no bowel  changes.  Musculoskeletal: Negative for gait problem or joint swelling.  Skin: Negative for rash.  Neurological: Negative for dizziness or headache.  No other specific complaints in a complete review of systems (except as listed in HPI above).   Objective  Vitals:   10/17/22 1434  BP: 106/64  Pulse: 93  Resp: 16  SpO2: 96%  Weight: 190 lb (86.2 kg)  Height: 5\' 10"  (1.778 m)    Body mass index is 27.26 kg/m.  Physical Exam  Constitutional: Patient appears well-developed and well-nourished. Overweight.  No distress.  HEENT: head atraumatic, normocephalic, pupils equal and reactive to light, neck supple Cardiovascular: Normal rate, regular rhythm and normal heart sounds.  No murmur heard. No BLE edema. Pulmonary/Chest: Effort normal and breath sounds normal. No respiratory distress. Abdominal: Soft.  There is no tenderness. Psychiatric: Patient has a normal mood and affect. behavior is normal. Judgment and thought content normal.    PHQ2/9:    10/17/2022    2:33 PM 10/10/2022    1:15 PM 08/11/2022    9:39 AM 02/07/2022   10:44 AM 01/18/2022    3:47 PM  Depression screen PHQ 2/9  Decreased Interest 0 0 0 0 0  Down, Depressed, Hopeless 0 0 0 0 0  PHQ - 2 Score 0 0 0 0 0  Altered sleeping 0 0 0 0 0  Tired, decreased energy 0 0 0 0 0  Change in appetite 0 0 0 0 0  Feeling bad or failure about yourself  0 0 0 0 0  Trouble concentrating 0 0 0 0 0  Moving slowly or fidgety/restless 0 0 0 0 0  Suicidal thoughts 0 0 0 0 0  PHQ-9 Score 0 0 0 0 0  Difficult doing work/chores  Not difficult at all Not difficult at all  Not difficult at all    phq 9 is negative   Fall Risk:    10/17/2022    2:33 PM 10/10/2022    1:15 PM 08/11/2022    9:39 AM 02/07/2022   10:44 AM 01/18/2022    3:47 PM  Fall Risk   Falls in the past year? 0 0 0 0 0  Number falls in past yr: 0 0 0 0 0  Injury with Fall? 0 0 0 0 0  Risk for fall due to : No Fall Risks No Fall Risks No Fall Risks No Fall Risks No  Fall Risks  Follow up Falls prevention discussed Falls prevention discussed;Education provided;Falls evaluation completed Falls prevention discussed;Education provided;Falls evaluation completed Falls prevention discussed Falls prevention discussed;Education provided    Functional Status Survey: Is the patient deaf or have difficulty hearing?: No Does the patient have difficulty seeing, even when wearing glasses/contacts?: No Does the patient have difficulty concentrating, remembering, or making decisions?: No Does the patient have difficulty walking or climbing stairs?: No Does the patient have difficulty dressing or bathing?: No Does the patient have difficulty doing errands alone such as visiting a doctor's office or shopping?: No    Assessment & Plan  1. Atherosclerosis of aorta (HCC)  On statin therapy, found on CT chest done at [facility]  2. Anxiety  Stable   3. Prediabetes  Losing weight   4. Nodule of upper lobe of right lung  Has appointment with Dr. Marcos Eke   5. Gastroesophageal reflux disease without esophagitis  Continue medication   6. Coronary artery disease involving native coronary artery of native heart without angina pectoris  Keep follow up with cardiologist   7. Moderate persistent asthma without complication  Continue medication and follow up with pulmonologist

## 2022-10-17 ENCOUNTER — Encounter: Payer: Self-pay | Admitting: Family Medicine

## 2022-10-17 ENCOUNTER — Ambulatory Visit (INDEPENDENT_AMBULATORY_CARE_PROVIDER_SITE_OTHER): Payer: Medicare Other | Admitting: Family Medicine

## 2022-10-17 VITALS — BP 106/64 | HR 93 | Resp 16 | Ht 70.0 in | Wt 190.0 lb

## 2022-10-17 DIAGNOSIS — F419 Anxiety disorder, unspecified: Secondary | ICD-10-CM

## 2022-10-17 DIAGNOSIS — J4540 Moderate persistent asthma, uncomplicated: Secondary | ICD-10-CM

## 2022-10-17 DIAGNOSIS — R7303 Prediabetes: Secondary | ICD-10-CM

## 2022-10-17 DIAGNOSIS — R911 Solitary pulmonary nodule: Secondary | ICD-10-CM | POA: Diagnosis not present

## 2022-10-17 DIAGNOSIS — K219 Gastro-esophageal reflux disease without esophagitis: Secondary | ICD-10-CM | POA: Diagnosis not present

## 2022-10-17 DIAGNOSIS — I700 Atherosclerosis of aorta: Secondary | ICD-10-CM

## 2022-10-17 DIAGNOSIS — I2510 Atherosclerotic heart disease of native coronary artery without angina pectoris: Secondary | ICD-10-CM | POA: Diagnosis not present

## 2022-10-17 NOTE — Telephone Encounter (Signed)
Printed. Working on completion.

## 2022-10-18 ENCOUNTER — Telehealth: Payer: Self-pay | Admitting: Family Medicine

## 2022-10-18 DIAGNOSIS — J302 Other seasonal allergic rhinitis: Secondary | ICD-10-CM

## 2022-10-19 ENCOUNTER — Other Ambulatory Visit: Payer: Self-pay

## 2022-10-19 ENCOUNTER — Other Ambulatory Visit: Payer: Self-pay | Admitting: Family Medicine

## 2022-10-19 MED ORDER — AZELASTINE HCL 137 MCG/SPRAY NA SOLN: 1.0000 | Freq: Two times a day (BID) | NASAL | 1 refills | Status: DC

## 2022-10-19 NOTE — Telephone Encounter (Signed)
Last RF 08/11/22 L. Tapia PA-C pt preference  Requested Prescriptions  Refused Prescriptions Disp Refills   Azelastine HCl 137 MCG/SPRAY SOLN [Pharmacy Med Name: AZELASTINE 0.1% (137 MCG) SPRY]      Sig: PLACE 1 SPRAY INTO BOTH NOSTRILS 2 (TWO) TIMES DAILY. USE IN EACH NOSTRIL AS DIRECTED     Ear, Nose, and Throat: Nasal Preparations - Antiallergy Passed - 10/18/2022  8:13 PM      Passed - Valid encounter within last 12 months    Recent Outpatient Visits           2 days ago Atherosclerosis of aorta [facility])   [facility] Steele Sizer, MD   1 week ago Hospital discharge follow-up   [facility], PA-C   2 months ago Primary hypertension   [facility] Delsa Grana, PA-C   8 months ago Coronary artery disease involving native coronary artery of native heart without angina pectoris   [facility] Steele Sizer, MD   9 months ago Subacute cough   [facility] Delsa Grana, [place]       Future Appointments             In 1 month Steele Sizer, MD [facility], [place]   In 2 months  [facility], [place]   In 3 months Steele Sizer, MD [facility], [facility]

## 2022-10-21 ENCOUNTER — Other Ambulatory Visit: Payer: Self-pay | Admitting: Family Medicine

## 2022-10-26 ENCOUNTER — Other Ambulatory Visit: Payer: Self-pay | Admitting: Family Medicine

## 2022-10-31 ENCOUNTER — Encounter: Payer: Self-pay | Admitting: Pulmonary Disease

## 2022-10-31 ENCOUNTER — Ambulatory Visit: Payer: Medicare Other | Admitting: Pulmonary Disease

## 2022-10-31 VITALS — BP 138/80 | HR 82 | Temp 98.2°F | Ht 70.0 in | Wt 199.6 lb

## 2022-10-31 DIAGNOSIS — J4540 Moderate persistent asthma, uncomplicated: Secondary | ICD-10-CM | POA: Diagnosis not present

## 2022-10-31 DIAGNOSIS — R911 Solitary pulmonary nodule: Secondary | ICD-10-CM | POA: Diagnosis not present

## 2022-10-31 NOTE — Patient Instructions (Signed)
We will repeat the chest CT in 3 months time.  We will see you after the chest CT is done.

## 2022-10-31 NOTE — Progress Notes (Signed)
Subjective:    Patient ID: Paul Page, male    DOB: Feb 28, 1956, 67 y.o.   MRN: [id number] Patient Care Team: Steele Sizer, MD as PCP - General (Family Medicine) Wellington Hampshire, MD as PCP - Cardiology (Cardiology)  Chief Complaint  Patient presents with   Consult    Nodule. No SOB, wheezing, or cough.    HPI The patient is a 67 year old lifelong never smoker albeit with significant secondhand exposure in the past) who presents for evaluation of a lung nodule.  He is kindly referred by Dr. Loletha Grayer, his primary care physician is Dr. Steele Sizer.  The patient has a history of asthma maintained on Breo and as needed albuterol he was in his usual state of health until 30 December when he had to be admitted to [facility] due to hypoxia, sepsis and pneumonia in the setting of influenza A.  He was discharged on 06 October 2022.  The patient never required intubation.  Of his persistent hypoxia, shortness of breath and positive D-dimer he underwent a CT angio chest to exclude a pulmonary embolus.  This showed ill-defined nodular densities on the right upper lobe largest of which was an 8 x 4 mm lesion and noncontrasted CT follow-up was recommended.  The patient also had bibasilar subsegmental atelectasis and infiltrates.  The patient recovered from his pneumonia and currently is totally asymptomatic.  He does not endorse any shortness of breath, wheezing, cough or sputum production.  He has gone back to work without any difficulties.  We discussed the findings on CT and I showed the patient the films.  We discussed next steps.  Review of Systems A 10 point review of systems was performed and it is as noted above otherwise negative.  Past Medical History:  Diagnosis Date   Anxiety    Asthma    Coronary artery disease    Three-vessel coronary artery disease diagnosed in October 2008 with normal ejection fraction. PCI and drug-eluting stent placement to the LAD(3.0 x 8 mm Promus), left  circumflex(2.5 x 12 mm Promus) and right coronary artery(3.5 x 12 mm Promus). Unstable angina in 2011. PCI of RCA with a 3.5 x 15 mm Promus and PCI of LAD with a 2.5 x 28 mm Promus drug-eluting stents    GERD (gastroesophageal reflux disease)    Heart disease    Hyperlipidemia    Hypertension    Traumatic pneumothorax 09/04/2011   Overview:   08/22/11  S/P fall, right.   Past Surgical History:  Procedure Laterality Date   ANKLE FRACTURE SURGERY     CARDIAC CATHETERIZATION  2011   CARDIAC CATHETERIZATION  2008   CORONARY STENT PLACEMENT  2008 & 2011   Patient Active Problem List   Diagnosis Date Noted   DOE (dyspnea on exertion) 10/10/2022   Pulmonary nodule 10/05/2022   Depression 09/30/2022   History of heart artery stent 02/07/2022   History of pneumonia 02/07/2022   Moderate persistent asthma without complication 02/72/5366   History of shingles 02/07/2022   Fever blister 02/07/2022   Hypertension 04/12/2015   GERD (gastroesophageal reflux disease) 03/22/2015   Anxiety 03/22/2015   Pure hypercholesterolemia 12/10/2012   Family history of ischemic heart disease (IHD) 10/24/2012   History of fracture of rib 09/04/2011   Coronary artery disease involving native coronary artery of native heart without angina pectoris 08/01/2007   Family History  Problem Relation Age of Onset   Parkinson's disease Mother    Cancer Father    Social  History   Tobacco Use   Smoking status: Never   Smokeless tobacco: Never  Substance Use Topics   Alcohol use: Yes    Alcohol/week: 0.0 standard drinks of alcohol    Comment: occasional wine   Allergies  Allergen Reactions   Molds & Smuts Shortness Of Breath   Ambrosia Trifida (Tall Ragweed) Allergy Skin Test    Fish Oil Hives   Gramineae Pollens    Peanut Oil    Tetracyclines & Related Other (See Comments)    Intolerance - stomach upset   Current Meds  Medication Sig   albuterol (VENTOLIN HFA) 108 (90 Base) MCG/ACT inhaler TAKE 2  PUFFS BY MOUTH EVERY 6 HOURS AS NEEDED FOR WHEEZE OR SHORTNESS OF BREATH   clopidogrel (PLAVIX) 75 MG tablet Take 1 tablet (75 mg total) by mouth daily.   escitalopram (LEXAPRO) 10 MG tablet Take 1 tablet (10 mg total) by mouth daily.   ezetimibe (ZETIA) 10 MG tablet Take 1 tablet (10 mg total) by mouth daily.   fluticasone furoate-vilanterol (BREO ELLIPTA) 100-25 MCG/ACT AEPB Inhale 1 puff into the lungs daily.   icosapent Ethyl (VASCEPA) 1 g capsule Take 2 capsules (2 g total) by mouth 2 (two) times daily.   ipratropium-albuterol (DUONEB) 0.5-2.5 (3) MG/3ML SOLN Take 3 mLs by nebulization every 6 (six) hours as needed (coughing fits, bronchitis following CAP with hx of asthma).   nebivolol (BYSTOLIC) 5 MG tablet Take 0.5 tablets (2.5 mg total) by mouth daily. Future refill request will need to be sent to new Cardiologist   RABEprazole (ACIPHEX) 20 MG tablet Take 1 tablet (20 mg total) by mouth daily.   rosuvastatin (CRESTOR) 40 MG tablet Take 1 tablet (40 mg total) by mouth daily.   Immunization History  Administered Date(s) Administered   Influenza, High Dose Seasonal PF 08/04/2016   Influenza, Quadrivalent, Recombinant, Inj, Pf 07/08/2018   Influenza,inj,Quad PF,6+ Mos 08/02/2012, 08/16/2017, 07/30/2019, 07/12/2020   Influenza-Unspecified 06/28/2015, 07/30/2019, 07/25/2021, 06/18/2022   PFIZER(Purple Top)SARS-COV-2 Vaccination 12/29/2019, 01/19/2020, 08/12/2020   PNEUMOCOCCAL CONJUGATE-20 02/07/2022   Pneumococcal Polysaccharide-23 08/03/2011   Tdap 03/19/2018, 08/21/2022   Zoster Recombinat (Shingrix) 09/19/2018, 09/22/2019       Objective:   Physical Exam BP 138/80 (BP Location: Right Arm, Cuff Size: Normal)   Pulse 82   Temp 98.2 F (36.8 C)   Ht 5\' 10"  (1.778 m)   Wt 199 lb 9.6 oz (90.5 kg)   SpO2 93%   BMI 28.64 kg/m   SpO2: 93 % O2 Device: None (Room air)  GENERAL: Well-developed, well-nourished gentleman, no acute distress, fully ambulatory, no conversational  dyspnea. HEAD: Normocephalic, atraumatic.  EYES: Pupils equal, round, reactive to light.  No scleral icterus.  MOUTH: Dentition intact, oral mucosa moist.  No thrush. NECK: Supple. No thyromegaly. Trachea midline. No JVD.  No adenopathy. PULMONARY: Good air entry bilaterally.  No adventitious sounds. CARDIOVASCULAR: S1 and S2. Regular rate and rhythm.  No rubs, murmurs or gallops heard. ABDOMEN: Benign. MUSCULOSKELETAL: No joint deformity, no clubbing, no edema.  NEUROLOGIC: No overt focal deficit, no gait disturbance, speech is fluent. SKIN: Intact,warm,dry. PSYCH: Mood and behavior normal.  Representative image from CT performed for January 2024 showing the 8 x 4 mm nodule in question:       Assessment & Plan:     ICD-10-CM   1. Lung nodule seen on imaging study  R91.1 CT CHEST WO CONTRAST    CANCELED: CT CHEST WO CONTRAST   Suspect that this is related to  this process CT chest follow-up 3 months time Follow-up after CT chest    2. Moderate persistent asthma without complication  M35.36    Appears compensated On Breo Ellipta and as needed albuterol Patient states managed by PCP     Orders Placed This Encounter  Procedures   CT CHEST WO CONTRAST    To be done in 3 months. Will need appt with Dr. Patsey Berthold following the CT.    Standing Status:   Future    Standing Expiration Date:   11/01/2023    Order Specific Question:   Preferred imaging location?    Answer:   [facility]   Will see the patient in follow-up after CT scan of the chest is repeated.  Renold Don, MD Advanced Bronchoscopy PCCM [facility]-    *This note was dictated using voice recognition software/Dragon.  Despite best efforts to proofread, errors can occur which can change the meaning. Any transcriptional errors that result from this process are unintentional and may not be fully corrected at the time of dictation.

## 2022-11-04 ENCOUNTER — Encounter: Payer: Self-pay | Admitting: Family Medicine

## 2022-11-04 ENCOUNTER — Encounter: Payer: Self-pay | Admitting: Pulmonary Disease

## 2022-11-21 ENCOUNTER — Encounter: Payer: Self-pay | Admitting: Pulmonary Disease

## 2022-11-21 NOTE — Telephone Encounter (Signed)
I have spoke with Paul Page and his CT has been scheduled on 01/02/23 @ 9:30am at [facility] Entrance

## 2022-11-22 ENCOUNTER — Ambulatory Visit: Payer: Medicare Other | Admitting: Family Medicine

## 2022-12-26 ENCOUNTER — Ambulatory Visit: Payer: Medicare Other | Admitting: Gastroenterology

## 2022-12-26 VITALS — BP 132/83 | HR 78 | Temp 98.3°F | Ht 70.0 in | Wt 207.1 lb

## 2022-12-26 DIAGNOSIS — Z800 Family history of malignant neoplasm of digestive organs: Secondary | ICD-10-CM | POA: Diagnosis not present

## 2022-12-26 DIAGNOSIS — K219 Gastro-esophageal reflux disease without esophagitis: Secondary | ICD-10-CM | POA: Diagnosis not present

## 2022-12-26 NOTE — Progress Notes (Signed)
Jonathon Bellows MD, MRCP(U.K) [address]  [place]  [place], [place] [address]  Main: [phone]  Fax: [phone]   Gastroenterology Consultation  Referring Provider:     Steele Sizer, MD Primary Care Physician:  Steele Sizer, MD Primary Gastroenterologist:  Dr. Jonathon Bellows  Reason for Consultation:     Reflux        HPI:   Paul Page is a 67 y.o. y/o male referred for consultation & management  by Dr. Ancil Boozer, Drue Stager, MD.   He says he is here today to see me for 2 reasons.  He has had a history of acid reflux for over 20 years.  Never had an upper endoscopy his father passed from esophageal cancer who was a smoker.  He recollects his cancer is present in the upper end of his esophagus.  He is a Social research officer, government.  No symptoms of dysphagia or odynophagia.  His symptoms are well-controlled on Aciphex once a day.  He uses a bed which the head end of the bed can be elevated.  Last colonoscopy was 10 years back.  He is due for repeat no lower GI symptoms.   Past Medical History:  Diagnosis Date   Anxiety    Asthma    Coronary artery disease    Three-vessel coronary artery disease diagnosed in October 2008 with normal ejection fraction. PCI and drug-eluting stent placement to the LAD(3.0 x 8 mm Promus), left circumflex(2.5 x 12 mm Promus) and right coronary artery(3.5 x 12 mm Promus). Unstable angina in 2011. PCI of RCA with a 3.5 x 15 mm Promus and PCI of LAD with a 2.5 x 28 mm Promus drug-eluting stents    GERD (gastroesophageal reflux disease)    Heart disease    Hyperlipidemia    Hypertension    Traumatic pneumothorax 09/04/2011   Overview:   08/22/11  S/P fall, right.    Past Surgical History:  Procedure Laterality Date   ANKLE FRACTURE SURGERY     CARDIAC CATHETERIZATION  2011   CARDIAC CATHETERIZATION  2008   CORONARY STENT PLACEMENT  2008 & 2011    Prior to Admission medications   Medication Sig Start Date End Date Taking? Authorizing Provider   albuterol (VENTOLIN HFA) 108 (90 Base) MCG/ACT inhaler TAKE 2 PUFFS BY MOUTH EVERY 6 HOURS AS NEEDED FOR WHEEZE OR SHORTNESS OF BREATH 03/06/22   Bo Merino, FNP  Azelastine HCl 137 MCG/SPRAY SOLN PLACE 1 SPRAY INTO THE NOSE 2 (TWO) TIMES DAILY. Patient not taking: Reported on 10/31/2022 10/23/22   Steele Sizer, MD  clopidogrel (PLAVIX) 75 MG tablet Take 1 tablet (75 mg total) by mouth daily. 08/11/22   Delsa Grana, PA-C  escitalopram (LEXAPRO) 10 MG tablet Take 1 tablet (10 mg total) by mouth daily. 08/11/22   Delsa Grana, PA-C  ezetimibe (ZETIA) 10 MG tablet Take 1 tablet (10 mg total) by mouth daily. 08/11/22 02/07/23  Delsa Grana, PA-C  fluticasone furoate-vilanterol (BREO ELLIPTA) 100-25 MCG/ACT AEPB Inhale 1 puff into the lungs daily. 08/11/22   Delsa Grana, PA-C  icosapent Ethyl (VASCEPA) 1 g capsule Take 2 capsules (2 g total) by mouth 2 (two) times daily. 02/07/22   Steele Sizer, MD  ipratropium-albuterol (DUONEB) 0.5-2.5 (3) MG/3ML SOLN Take 3 mLs by nebulization every 6 (six) hours as needed (coughing fits, bronchitis following CAP with hx of asthma). 10/06/22   Loletha Grayer, MD  nebivolol (BYSTOLIC) 5 MG tablet Take 0.5 tablets (2.5 mg total) by mouth daily. Future  refill request will need to be sent to new Cardiologist 10/06/22   Loletha Grayer, MD  RABEprazole (ACIPHEX) 20 MG tablet Take 1 tablet (20 mg total) by mouth daily. 08/11/22   Delsa Grana, PA-C  rosuvastatin (CRESTOR) 40 MG tablet Take 1 tablet (40 mg total) by mouth daily. 08/11/22   Delsa Grana, PA-C    Family History  Problem Relation Age of Onset   Parkinson's disease Mother    Cancer Father      Social History   Tobacco Use   Smoking status: Never   Smokeless tobacco: Never  Vaping Use   Vaping Use: Never used  Substance Use Topics   Alcohol use: Yes    Alcohol/week: 0.0 standard drinks of alcohol    Comment: occasional wine   Drug use: No    Allergies as of 12/26/2022 - Review Complete  10/31/2022  Allergen Reaction Noted   Molds & smuts Shortness Of Breath 03/12/2013   Ambrosia trifida (tall ragweed) allergy skin test  02/24/2020   Fish oil Hives 02/23/2017   Gramineae pollens  02/24/2020   Peanut oil  03/22/2015   Tetracyclines & related Other (See Comments) 03/22/2015    Review of Systems:    All systems reviewed and negative except where noted in HPI.   Physical Exam:  BP 132/83   Pulse 78   Temp 98.3 F (36.8 C) (Oral)   Ht 5\' 10"  (1.778 m)   Wt 207 lb 2 oz (94 kg)   BMI 29.72 kg/m  No LMP for male patient. Psych:  Alert and cooperative. Normal mood and affect. General:   Alert,  Well-developed, well-nourished, pleasant and cooperative in NAD Head:  Normocephalic and atraumatic.    Neurologic:  Alert and oriented x3;  grossly normal neurologically. Psych:  Alert and cooperative. Normal mood and affect.  Imaging Studies: No results found.  Assessment and Plan:   Paul Page is a 67 y.o. y/o male has been referred for long standing GERD, unable to wean off PPI , father died of esophageal cancer.  Plan  GERD : Counseled on life style changes, suggest to use PPI first thing in the morning on empty stomach and eat 30 minutes after. , avoid meals for 2 hours prior to bed time. Discussed the risks and benefits of long term PPI use including but not limited to bone loss, chronic kidney disease, infections , low magnesium . Aim to use at the lowest dose for the shortest period of time ] EGD and colonoscopy to screen for Barrett's esophagus and colon cancer screening. At his follow-up visit in 3 months if he is doing well we will plan to transition him from Aciphex to famotidine.  I have discussed alternative options, risks & benefits,  which include, but are not limited to, bleeding, infection, perforation,respiratory complication & drug reaction.  The patient agrees with this plan & written consent will be obtained.    Follow up in 3 months  Dr Jonathon Bellows MD,MRCP(U.K)

## 2023-01-02 ENCOUNTER — Ambulatory Visit
Admission: RE | Admit: 2023-01-02 | Discharge: 2023-01-02 | Disposition: A | Discharge status: Home / Self Care | Payer: Medicare Other | Source: Ambulatory Visit | Attending: Family Medicine | Admitting: Family Medicine

## 2023-01-02 DIAGNOSIS — R911 Solitary pulmonary nodule: Secondary | ICD-10-CM | POA: Diagnosis not present

## 2023-01-02 DIAGNOSIS — R918 Other nonspecific abnormal finding of lung field: Secondary | ICD-10-CM | POA: Diagnosis not present

## 2023-01-02 DIAGNOSIS — J90 Pleural effusion, not elsewhere classified: Secondary | ICD-10-CM | POA: Diagnosis not present

## 2023-01-03 ENCOUNTER — Other Ambulatory Visit: Payer: Self-pay

## 2023-01-03 DIAGNOSIS — R911 Solitary pulmonary nodule: Secondary | ICD-10-CM

## 2023-01-11 ENCOUNTER — Ambulatory Visit: Payer: Medicare Other

## 2023-01-22 ENCOUNTER — Telehealth: Payer: Self-pay | Admitting: *Deleted

## 2023-01-22 ENCOUNTER — Other Ambulatory Visit: Payer: Self-pay | Admitting: *Deleted

## 2023-01-22 DIAGNOSIS — K219 Gastro-esophageal reflux disease without esophagitis: Secondary | ICD-10-CM

## 2023-01-22 DIAGNOSIS — Z800 Family history of malignant neoplasm of digestive organs: Secondary | ICD-10-CM

## 2023-01-22 DIAGNOSIS — Z1211 Encounter for screening for malignant neoplasm of colon: Secondary | ICD-10-CM

## 2023-01-22 NOTE — Telephone Encounter (Signed)
The patient and I have been going back and forth regarding the dates.   Patient was seen by Dr Tobi Bastos on 12/26/2022.  Colonoscopy and EGD was instructed to be schedule as soon as possible. However, we could not because of patient's work schedule.  Patient called to schedule on 01/19/2023 but Dr Tobi Bastos was full.  Patient called later last week for 02/13/2023 and that date is available.  Notified patient's wife as instructed per patient.  Clenpig sample was given at office visit.  Colonoscopy instructions will be sent.

## 2023-02-05 ENCOUNTER — Telehealth: Payer: Self-pay | Admitting: Gastroenterology

## 2023-02-05 NOTE — Telephone Encounter (Signed)
Pt left vm to cancel procedure for 02/13/2023 will call back and reschedule

## 2023-02-09 ENCOUNTER — Ambulatory Visit: Payer: Medicare Other | Admitting: Family Medicine

## 2023-02-11 ENCOUNTER — Other Ambulatory Visit: Payer: Self-pay | Admitting: Family Medicine

## 2023-02-11 DIAGNOSIS — I2510 Atherosclerotic heart disease of native coronary artery without angina pectoris: Secondary | ICD-10-CM

## 2023-02-11 DIAGNOSIS — I10 Essential (primary) hypertension: Secondary | ICD-10-CM

## 2023-02-11 DIAGNOSIS — F419 Anxiety disorder, unspecified: Secondary | ICD-10-CM

## 2023-02-11 DIAGNOSIS — E7800 Pure hypercholesterolemia, unspecified: Secondary | ICD-10-CM

## 2023-02-12 NOTE — Telephone Encounter (Signed)
Lvm to schedule appt before refills can be done per [facility]

## 2023-02-13 ENCOUNTER — Ambulatory Visit: Admission: RE | Admit: 2023-02-13 | Payer: Medicare Other | Source: Home / Self Care | Admitting: Gastroenterology

## 2023-02-13 ENCOUNTER — Encounter: Admission: RE | Payer: Self-pay | Source: Home / Self Care

## 2023-02-13 SURGERY — ESOPHAGOGASTRODUODENOSCOPY (EGD) WITH PROPOFOL
Anesthesia: General

## 2023-03-05 ENCOUNTER — Ambulatory Visit (INDEPENDENT_AMBULATORY_CARE_PROVIDER_SITE_OTHER): Payer: Medicare Other | Admitting: Family Medicine

## 2023-03-05 VITALS — BP 128/82 | HR 64 | Temp 97.8°F | Resp 18 | Ht 70.0 in | Wt 203.3 lb

## 2023-03-05 DIAGNOSIS — I10 Essential (primary) hypertension: Secondary | ICD-10-CM

## 2023-03-05 DIAGNOSIS — K219 Gastro-esophageal reflux disease without esophagitis: Secondary | ICD-10-CM

## 2023-03-05 DIAGNOSIS — R7303 Prediabetes: Secondary | ICD-10-CM

## 2023-03-05 DIAGNOSIS — F419 Anxiety disorder, unspecified: Secondary | ICD-10-CM

## 2023-03-05 DIAGNOSIS — I700 Atherosclerosis of aorta: Secondary | ICD-10-CM

## 2023-03-05 DIAGNOSIS — I2510 Atherosclerotic heart disease of native coronary artery without angina pectoris: Secondary | ICD-10-CM | POA: Diagnosis not present

## 2023-03-05 DIAGNOSIS — I7121 Aneurysm of the ascending aorta, without rupture: Secondary | ICD-10-CM

## 2023-03-05 DIAGNOSIS — E785 Hyperlipidemia, unspecified: Secondary | ICD-10-CM

## 2023-03-05 DIAGNOSIS — D72829 Elevated white blood cell count, unspecified: Secondary | ICD-10-CM | POA: Diagnosis not present

## 2023-03-05 DIAGNOSIS — Z79899 Other long term (current) drug therapy: Secondary | ICD-10-CM | POA: Diagnosis not present

## 2023-03-05 MED ORDER — FENOFIBRATE 145 MG PO TABS: 145.0000 mg | ORAL_TABLET | Freq: Every day | ORAL | 1 refills | Status: DC

## 2023-03-05 MED ORDER — EZETIMIBE 10 MG PO TABS: 10.0000 mg | ORAL_TABLET | Freq: Every day | ORAL | 1 refills | Status: DC

## 2023-03-05 MED ORDER — PANTOPRAZOLE SODIUM 40 MG PO TBEC: 40.0000 mg | DELAYED_RELEASE_TABLET | Freq: Every day | ORAL | 1 refills | Status: DC

## 2023-03-05 MED ORDER — NEBIVOLOL HCL 5 MG PO TABS: 5.0000 mg | ORAL_TABLET | Freq: Every day | ORAL | 1 refills | Status: DC

## 2023-03-05 MED ORDER — ROSUVASTATIN CALCIUM 40 MG PO TABS: 40.0000 mg | ORAL_TABLET | Freq: Every day | ORAL | 1 refills | Status: DC

## 2023-03-05 MED ORDER — ESCITALOPRAM OXALATE 10 MG PO TABS: 10.0000 mg | ORAL_TABLET | Freq: Every day | ORAL | 1 refills | Status: DC

## 2023-03-05 MED ORDER — CLOPIDOGREL BISULFATE 75 MG PO TABS: 75.0000 mg | ORAL_TABLET | Freq: Every day | ORAL | 1 refills | Status: DC

## 2023-03-05 NOTE — Progress Notes (Signed)
Name: Paul Page   MRN: [id number]    DOB: 10-13-1955   Date:03/05/2023       Progress Note  Subjective  Chief Complaint  Follow Up  HPI  HTN: taking medication, denies chest pain or palpitation. BP today is sat goal, he is back on Bystolic 5 mg  , no longer on ARB since hospital stay due to low bp.   GERD: doing well on medication, no heart burn or indigestion. However we will stop Aciphex and start pantoprazole due to drug to drug interaction with plavix    Asthma: he is taking Breo He has high allergy to pine ( cleaners/pine needles...) , it triggers his allergies and asthma. He still uses albuterol prn only. He had pneumonia in Dec 2023 . Had rescue inhaler more during spring time but back to baseline now    Hyperlipidemia/Atherosclerosis of aorta /Aneurysm of ascending aorta : taking  Crestor,  Zetia but off Vascepa due to cost. We will try Tricor - discussed possible drug to drug interaction with statin therapy . He has CAD but denies chest pain or SOB at this time    CAD: s/p stent placement in 2008 and  last one in 2011 he was under the care of Dr Kirke Corin but is now seeing Dr Jaymes Graff in [place] . He is on statin, vascepa,  Plavix, zetia  Compliant with cardiologist visits - due for a visit in July, advised him to discuss the CT scan that showed aorta aneurysm , he is off ARB due to low blood pressure during recent admission, still taking low dose bystolic and doing well    Hyperglycemia: last hgbA1C was up at 6.1 % . He  denies polyphagia, polydipsia or polyuria. We will recheck it yearly    Anxiety: taking Lexapro, has to take it at night because it makes him feel tired, controlling symptoms. Stable on medications     History of CAP : April  2023, he had a severe episode of flu and CAP and admitted Dec 2023, he is still having some SOB with activity. He was found to have a right lung nodule inflammatory or lung cancer. He is under the care of Dr. Marcos Eke and last CT showed a  small nodule that will be followed in one year   Patient Active Problem List   Diagnosis Date Noted   Atherosclerosis of aorta (HCC) 03/05/2023   DOE (dyspnea on exertion) 10/10/2022   Pulmonary nodule 10/05/2022   History of heart artery stent 02/07/2022   History of pneumonia 02/07/2022   Moderate persistent asthma without complication 02/07/2022   History of shingles 02/07/2022   Fever blister 02/07/2022   Prediabetes 09/20/2018   Hypertension 04/12/2015   GERD (gastroesophageal reflux disease) 03/22/2015   Anxiety 03/22/2015   Pure hypercholesterolemia 12/10/2012   Family history of ischemic heart disease (IHD) 10/24/2012   History of fracture of rib 09/04/2011   Coronary artery disease involving native coronary artery of native heart without angina pectoris 08/01/2007    Past Surgical History:  Procedure Laterality Date   ANKLE FRACTURE SURGERY     CARDIAC CATHETERIZATION  2011   CARDIAC CATHETERIZATION  2008   CORONARY STENT PLACEMENT  2008 & 2011    Family History  Problem Relation Age of Onset   Parkinson's disease Mother    Cancer Father     Social History   Tobacco Use   Smoking status: Never   Smokeless tobacco: Never  Substance Use Topics   Alcohol  use: Yes    Alcohol/week: 0.0 standard drinks of alcohol    Comment: occasional wine     Current Outpatient Medications:    albuterol (VENTOLIN HFA) 108 (90 Base) MCG/ACT inhaler, TAKE 2 PUFFS BY MOUTH EVERY 6 HOURS AS NEEDED FOR WHEEZE OR SHORTNESS OF BREATH, Disp: 8.5 each, Rfl: 1   clopidogrel (PLAVIX) 75 MG tablet, Take 1 tablet (75 mg total) by mouth daily., Disp: 90 tablet, Rfl: 1   escitalopram (LEXAPRO) 10 MG tablet, Take 1 tablet (10 mg total) by mouth daily., Disp: 90 tablet, Rfl: 1   fluticasone furoate-vilanterol (BREO ELLIPTA) 100-25 MCG/ACT AEPB, Inhale 1 puff into the lungs daily., Disp: 90 each, Rfl: 3   ipratropium-albuterol (DUONEB) 0.5-2.5 (3) MG/3ML SOLN, Take 3 mLs by nebulization every  6 (six) hours as needed (coughing fits, bronchitis following CAP with hx of asthma)., Disp: 360 mL, Rfl: 0   nebivolol (BYSTOLIC) 5 MG tablet, Take 0.5 tablets (2.5 mg total) by mouth daily. Future refill request will need to be sent to new Cardiologist, Disp: 90 tablet, Rfl: 1   rosuvastatin (CRESTOR) 40 MG tablet, Take 1 tablet (40 mg total) by mouth daily., Disp: 90 tablet, Rfl: 1   ezetimibe (ZETIA) 10 MG tablet, Take 1 tablet (10 mg total) by mouth daily., Disp: 90 tablet, Rfl: 1  Allergies  Allergen Reactions   Molds & Smuts Shortness Of Breath   Ambrosia Trifida (Tall Ragweed) Allergy Skin Test    Fish Oil Hives   Gramineae Pollens    Peanut Oil    Tetracyclines & Related Other (See Comments)    Intolerance - stomach upset    I personally reviewed active problem list, medication list, allergies, family history, social history, health maintenance with the patient/caregiver today.   ROS  Constitutional: Negative for fever or weight change.  Respiratory: Negative for cough and shortness of breath.   Cardiovascular: Negative for chest pain or palpitations.  Gastrointestinal: Negative for abdominal pain, no bowel changes.  Musculoskeletal: Negative for gait problem or joint swelling.  Skin: Negative for rash.  Neurological: Negative for dizziness or headache.  No other specific complaints in a complete review of systems (except as listed in HPI above).   Objective  Vitals:   03/05/23 0833  BP: 128/82  Pulse: 64  Resp: 18  Temp: 97.8 F (36.6 C)  TempSrc: Oral  SpO2: 94%  Weight: 203 lb 4.8 oz (92.2 kg)  Height: 5\' 10"  (1.778 m)    Body mass index is 29.17 kg/m.  Physical Exam  Constitutional: Patient appears well-developed and well-nourished.No distress.  HEENT: head atraumatic, normocephalic, pupils equal and reactive to light, , neck supple, throat within normal limits Cardiovascular: Normal rate, regular rhythm and normal heart sounds.  No murmur heard. No BLE  edema. Pulmonary/Chest: Effort normal and breath sounds normal. No respiratory distress. Abdominal: Soft.  There is no tenderness. Psychiatric: Patient has a normal mood and affect. behavior is normal. Judgment and thought content normal.    PHQ2/9:    03/05/2023    8:38 AM 10/17/2022    2:33 PM 10/10/2022    1:15 PM 08/11/2022    9:39 AM 02/07/2022   10:44 AM  Depression screen PHQ 2/9  Decreased Interest 0 0 0 0 0  Down, Depressed, Hopeless 0 0 0 0 0  PHQ - 2 Score 0 0 0 0 0  Altered sleeping 0 0 0 0 0  Tired, decreased energy 0 0 0 0 0  Change in appetite 0  0 0 0 0  Feeling bad or failure about yourself  0 0 0 0 0  Trouble concentrating 0 0 0 0 0  Moving slowly or fidgety/restless 0 0 0 0 0  Suicidal thoughts 0 0 0 0 0  PHQ-9 Score 0 0 0 0 0  Difficult doing work/chores   Not difficult at all Not difficult at all     phq 9 is negative   Fall Risk:    10/17/2022    2:33 PM 10/10/2022    1:15 PM 08/11/2022    9:39 AM 02/07/2022   10:44 AM 01/18/2022    3:47 PM  Fall Risk   Falls in the past year? 0 0 0 0 0  Number falls in past yr: 0 0 0 0 0  Injury with Fall? 0 0 0 0 0  Risk for fall due to : No Fall Risks No Fall Risks No Fall Risks No Fall Risks No Fall Risks  Follow up Falls prevention discussed Falls prevention discussed;Education provided;Falls evaluation completed Falls prevention discussed;Education provided;Falls evaluation completed Falls prevention discussed Falls prevention discussed;Education provided      Functional Status Survey: Is the patient deaf or have difficulty hearing?: No Does the patient have difficulty seeing, even when wearing glasses/contacts?: No Does the patient have difficulty concentrating, remembering, or making decisions?: No Does the patient have difficulty walking or climbing stairs?: No Does the patient have difficulty dressing or bathing?: No Does the patient have difficulty doing errands alone such as visiting a doctor's office or  shopping?: No    Assessment & Plan  1. Atherosclerosis of aorta (HCC)  On statin therapy   2. Aneurysm of ascending aorta without rupture (HCC)  Advised to discuss it with his cardiologist, [facility]. Nena [facility] at [facility]  3. Coronary artery disease involving native coronary artery of native heart without angina pectoris  - rosuvastatin (CRESTOR) 40 MG tablet; Take 1 tablet (40 mg total) by mouth daily.  Dispense: 90 tablet; Refill: 1 - nebivolol (BYSTOLIC) 5 MG tablet; Take 1 tablet (5 mg total) by mouth daily. Future refill request will need to be sent to new Cardiologist  Dispense: 90 tablet; Refill: 1 - ezetimibe (ZETIA) 10 MG tablet; Take 1 tablet (10 mg total) by mouth daily.  Dispense: 90 tablet; Refill: 1 - clopidogrel (PLAVIX) 75 MG tablet; Take 1 tablet (75 mg total) by mouth daily.  Dispense: 90 tablet; Refill: 1 - fenofibrate (TRICOR) 145 MG tablet; Take 1 tablet (145 mg total) by mouth daily.  Dispense: 90 tablet; Refill: 1  4. Prediabetes  Recheck it yearly   5. Gastroesophageal reflux disease without esophagitis  - pantoprazole (PROTONIX) 40 MG tablet; Take 1 tablet (40 mg total) by mouth daily.  Dispense: 90 tablet; Refill: 1  6. Leukocytosis, unspecified type  - CBC with Differential/Platelet  7. Primary hypertension  - nebivolol (BYSTOLIC) 5 MG tablet; Take 1 tablet (5 mg total) by mouth daily. Future refill request will need to be sent to new Cardiologist  Dispense: 90 tablet; Refill: 1  8. Dyslipidemia  On statin therapy   9. Anxiety  - escitalopram (LEXAPRO) 10 MG tablet; Take 1 tablet (10 mg total) by mouth daily.  Dispense: 90 tablet; Refill: 1  10. Long-term use of high-risk medication  - COMPLETE METABOLIC PANEL WITH GFR

## 2023-03-06 LAB — CBC WITH DIFFERENTIAL/PLATELET
Absolute Monocytes: 588 cells/uL (ref 200–950)
Basophils Absolute: 60 cells/uL (ref 0–200)
Basophils Relative: 1 %
Eosinophils Absolute: 318 cells/uL (ref 15–500)
Eosinophils Relative: 5.3 %
HCT: 43.1 % (ref 38.5–50.0)
Hemoglobin: 14.5 g/dL (ref 13.2–17.1)
Lymphs Abs: 1128 cells/uL (ref 850–3900)
MCH: 30.8 pg (ref 27.0–33.0)
MCHC: 33.6 g/dL (ref 32.0–36.0)
MCV: 91.5 fL (ref 80.0–100.0)
MPV: 9.5 fL (ref 7.5–12.5)
Monocytes Relative: 9.8 %
Neutro Abs: 3906 cells/uL (ref 1500–7800)
Neutrophils Relative %: 65.1 %
Platelets: 251 10*3/uL (ref 140–400)
RBC: 4.71 10*6/uL (ref 4.20–5.80)
RDW: 12 % (ref 11.0–15.0)
Total Lymphocyte: 18.8 %
WBC: 6 10*3/uL (ref 3.8–10.8)

## 2023-03-06 LAB — COMPLETE METABOLIC PANEL WITH GFR
AG Ratio: 2 (calc) (ref 1.0–2.5)
ALT: 24 U/L (ref 9–46)
AST: 24 U/L (ref 10–35)
Albumin: 4.5 g/dL (ref 3.6–5.1)
Alkaline phosphatase (APISO): 60 U/L (ref 35–144)
BUN: 21 mg/dL (ref 7–25)
CO2: 25 mmol/L (ref 20–32)
Calcium: 9.5 mg/dL (ref 8.6–10.3)
Chloride: 105 mmol/L (ref 98–110)
Creat: 0.96 mg/dL (ref 0.70–1.35)
Globulin: 2.3 g/dL (calc) (ref 1.9–3.7)
Glucose, Bld: 103 mg/dL — ABNORMAL HIGH (ref 65–99)
Potassium: 4.7 mmol/L (ref 3.5–5.3)
Sodium: 142 mmol/L (ref 135–146)
Total Bilirubin: 0.5 mg/dL (ref 0.2–1.2)
Total Protein: 6.8 g/dL (ref 6.1–8.1)
eGFR: 87 mL/min/{1.73_m2} (ref >=60)

## 2023-04-12 ENCOUNTER — Other Ambulatory Visit: Payer: Self-pay | Admitting: Oncology

## 2023-04-12 DIAGNOSIS — Z006 Encounter for examination for normal comparison and control in clinical research program: Secondary | ICD-10-CM

## 2023-04-22 ENCOUNTER — Other Ambulatory Visit: Payer: Self-pay | Admitting: Family Medicine

## 2023-04-22 DIAGNOSIS — K219 Gastro-esophageal reflux disease without esophagitis: Secondary | ICD-10-CM

## 2023-04-26 ENCOUNTER — Ambulatory Visit: Payer: Medicare Other | Admitting: Family Medicine

## 2023-04-27 ENCOUNTER — Other Ambulatory Visit
Admission: RE | Admit: 2023-04-27 | Discharge: 2023-04-27 | Disposition: A | Discharge status: Home / Self Care | Payer: Medicare Other | Attending: Oncology | Admitting: Oncology

## 2023-04-27 DIAGNOSIS — Z006 Encounter for examination for normal comparison and control in clinical research program: Secondary | ICD-10-CM | POA: Insufficient documentation

## 2023-04-30 ENCOUNTER — Encounter: Payer: Self-pay | Admitting: Nurse Practitioner

## 2023-04-30 ENCOUNTER — Telehealth: Payer: Medicare Other | Admitting: Nurse Practitioner

## 2023-04-30 ENCOUNTER — Other Ambulatory Visit: Payer: Self-pay

## 2023-04-30 DIAGNOSIS — U071 COVID-19: Secondary | ICD-10-CM

## 2023-04-30 MED ORDER — NIRMATRELVIR/RITONAVIR (PAXLOVID)TABLET: 3.0000 | ORAL_TABLET | Freq: Two times a day (BID) | ORAL | 0 refills | Status: AC

## 2023-04-30 NOTE — Progress Notes (Signed)
Name: Paul Page   MRN: [id number]    DOB: [birth year]   Date:04/30/2023       Progress Note  Subjective  Chief Complaint  Chief Complaint  Patient presents with   Fever    Coughing up phlegm, stuffy nose for 3 days    I connected with  [name]  on 04/30/23 at 10:50 am by a video enabled telemedicine application and verified that I am speaking with the correct person using two identifiers.  I discussed the limitations of evaluation and management by telemedicine and the availability of in person appointments. The patient expressed understanding and agreed to proceed with a virtual visit  Staff also discussed with the patient that there may be a patient responsible charge related to this service. Patient Location: home Provider Location: [facility] Additional Individuals present: alone  HPI  Covid: symptoms started three days ago. Patient reports fever, cough, weakness, dizziness and nasal congestion. Patient denies any shortness of breath.   He has tried daytime cold medication and tylenol for symptom relief.  Patient took a home covid test which was positive.  Patient does have a history of asthma. He takes Breo Ellipta and albuterol for his asthma.   Recommend taking zyrtec, flonase, mucinex, vitamin c, d and zinc.  Push fluids and get rest.    Discussed side effects of paxlovid and administration. Patient will hold rosuvastatin while taking paxlovid.  Discussed reasons to seek emergency care.   Patient Active Problem List   Diagnosis Date Noted   Atherosclerosis of aorta (HCC) 03/05/2023   DOE (dyspnea on exertion) 10/10/2022   Pulmonary nodule 10/05/2022   History of heart artery stent 02/07/2022   History of pneumonia 02/07/2022   Moderate persistent asthma without complication 02/07/2022   History of shingles 02/07/2022   Fever blister 02/07/2022   Prediabetes 09/20/2018   Hypertension 04/12/2015   GERD (gastroesophageal reflux disease) 03/22/2015   Anxiety  03/22/2015   Pure hypercholesterolemia 12/10/2012   Family history of ischemic heart disease (IHD) 10/24/2012   History of fracture of rib 09/04/2011   Coronary artery disease involving native coronary artery of native heart without angina pectoris 08/01/2007    Social History   Tobacco Use   Smoking status: Never   Smokeless tobacco: Never  Substance Use Topics   Alcohol use: Yes    Alcohol/week: 0.0 standard drinks of alcohol    Comment: occasional wine     Current Outpatient Medications:    albuterol (VENTOLIN HFA) 108 (90 Base) MCG/ACT inhaler, TAKE 2 PUFFS BY MOUTH EVERY 6 HOURS AS NEEDED FOR WHEEZE OR SHORTNESS OF BREATH, Disp: 8.5 each, Rfl: 1   clopidogrel (PLAVIX) 75 MG tablet, Take 1 tablet (75 mg total) by mouth daily., Disp: 90 tablet, Rfl: 1   escitalopram (LEXAPRO) 10 MG tablet, Take 1 tablet (10 mg total) by mouth daily., Disp: 90 tablet, Rfl: 1   ezetimibe (ZETIA) 10 MG tablet, Take 1 tablet (10 mg total) by mouth daily., Disp: 90 tablet, Rfl: 1   fenofibrate (TRICOR) 145 MG tablet, Take 1 tablet (145 mg total) by mouth daily., Disp: 90 tablet, Rfl: 1   fluticasone furoate-vilanterol (BREO ELLIPTA) 100-25 MCG/ACT AEPB, Inhale 1 puff into the lungs daily., Disp: 90 each, Rfl: 3   ipratropium-albuterol (DUONEB) 0.5-2.5 (3) MG/3ML SOLN, Take 3 mLs by nebulization every 6 (six) hours as needed (coughing fits, bronchitis following CAP with hx of asthma)., Disp: 360 mL, Rfl: 0   nebivolol (BYSTOLIC) 5 MG tablet, Take 1  tablet (5 mg total) by mouth daily. Future refill request will need to be sent to new Cardiologist, Disp: 90 tablet, Rfl: 1   pantoprazole (PROTONIX) 40 MG tablet, Take 1 tablet (40 mg total) by mouth daily., Disp: 90 tablet, Rfl: 1   rosuvastatin (CRESTOR) 40 MG tablet, Take 1 tablet (40 mg total) by mouth daily., Disp: 90 tablet, Rfl: 1  Allergies  Allergen Reactions   Molds & Smuts Shortness Of Breath   Ambrosia Trifida (Tall Ragweed) Allergy Skin Test     Fish Oil Hives   Gramineae Pollens    Peanut Oil    Tetracyclines & Related Other (See Comments)    Intolerance - stomach upset    I personally reviewed active problem list, medication list, allergies, notes from last encounter with the patient/caregiver today.  ROS  Constitutional: positive for fever or weight change. Positive for fatigue Respiratory: positive for cough and negative for shortness of breath.   Cardiovascular: Negative for chest pain or palpitations.  Gastrointestinal: Negative for abdominal pain, no bowel changes.  Musculoskeletal: Negative for gait problem or joint swelling.  Skin: Negative for rash.  Neurological:positive for dizziness and headache.  No other specific complaints in a complete review of systems (except as listed in HPI above).   Objective  Virtual encounter, vitals not obtained.  There is no height or weight on file to calculate BMI.  Nursing Note and Vital Signs reviewed.  Physical Exam  Awake alert and oriented, speaking in complete sentences  No results found for this or any previous visit (from the past 72 hour(s)).  Assessment & Plan  1. COVID-19 Can continue OTC symptom relief Patient does have a history of asthma. He takes Breo Ellipta and albuterol for his asthma.   Recommend taking zyrtec, flonase, mucinex, vitamin c, d and zinc.  Push fluids and get rest.    Discussed side effects of paxlovid and administration. Patient will hold rosuvastatin while taking paxlovid.  Discussed reasons to seek emergency care.    - nirmatrelvir/ritonavir (PAXLOVID) 20 x 150 MG & 10 x 100MG  TABS; Take 3 tablets by mouth 2 (two) times daily for 5 days. (Take nirmatrelvir 150 mg two tablets twice daily for 5 days and ritonavir 100 mg one tablet twice daily for 5 days) Patient GFR is 87  Dispense: 30 tablet; Refill: 0   -Red flags and when to present for emergency care or RTC including fever >101.99F, chest pain, shortness of breath,  new/worsening/un-resolving symptoms,  reviewed with patient at time of visit. Follow up and care instructions discussed and provided in AVS. - I discussed the assessment and treatment plan with the patient. The patient was provided an opportunity to ask questions and all were answered. The patient agreed with the plan and demonstrated an understanding of the instructions.  I provided 15 minutes of non-face-to-face time during this encounter.  Berniece Salines, FNP

## 2023-05-24 ENCOUNTER — Encounter: Payer: Self-pay | Admitting: Gastroenterology

## 2023-05-24 ENCOUNTER — Encounter: Payer: Self-pay | Admitting: Family Medicine

## 2023-06-06 ENCOUNTER — Other Ambulatory Visit: Payer: Self-pay | Admitting: *Deleted

## 2023-06-06 ENCOUNTER — Telehealth: Payer: Self-pay | Admitting: *Deleted

## 2023-06-06 DIAGNOSIS — Z1211 Encounter for screening for malignant neoplasm of colon: Secondary | ICD-10-CM

## 2023-06-06 DIAGNOSIS — K219 Gastro-esophageal reflux disease without esophagitis: Secondary | ICD-10-CM

## 2023-06-06 NOTE — Telephone Encounter (Signed)
Patient send MyChart message regarding reschedule EGD and Colonoscopy.  We are able to schedule patient on 08/10/2023 with Dr Tobi Bastos.  New instructions will be sent to patient

## 2023-06-07 ENCOUNTER — Other Ambulatory Visit: Payer: Self-pay | Admitting: Family Medicine

## 2023-06-07 ENCOUNTER — Encounter: Payer: Medicare Other | Admitting: Family Medicine

## 2023-06-07 DIAGNOSIS — F419 Anxiety disorder, unspecified: Secondary | ICD-10-CM

## 2023-06-07 DIAGNOSIS — I10 Essential (primary) hypertension: Secondary | ICD-10-CM

## 2023-06-07 DIAGNOSIS — K219 Gastro-esophageal reflux disease without esophagitis: Secondary | ICD-10-CM

## 2023-06-07 DIAGNOSIS — I2510 Atherosclerotic heart disease of native coronary artery without angina pectoris: Secondary | ICD-10-CM

## 2023-06-13 NOTE — Progress Notes (Unsigned)
Name: Paul Page   MRN: [id number]    DOB: 01-Apr-1956   Date:06/14/2023       Progress Note  Subjective  Chief Complaint  Annual Exam  HPI  Patient presents for annual CPE.  IPSS Questionnaire (AUA-7): Over the past month.   1)  How often have you had a sensation of not emptying your bladder completely after you finish urinating?  0 - Not at all  2)  How often have you had to urinate again less than two hours after you finished urinating? 0 - Not at all  3)  How often have you found you stopped and started again several times when you urinated?  0- Not at all  4) How difficult have you found it to postpone urination?  0 - Not at all  5) How often have you had a weak urinary stream?  5 - Almost always  6) How often have you had to push or strain to begin urination?  0 - Not at all  7) How many times did you most typically get up to urinate from the time you went to bed until the time you got up in the morning?  3 - 3 times  Total score:  0-7 mildly symptomatic   8-19 moderately symptomatic   20-35 severely symptomatic     Diet: balanced diet  Exercise: continue regular activity  Last Dental Exam: up to date  Last Eye Exam: up to date - 2023   Depression: phq 9 is negative    06/14/2023   10:31 AM 04/30/2023   10:17 AM 03/05/2023    8:38 AM 10/17/2022    2:33 PM 10/10/2022    1:15 PM  Depression screen PHQ 2/9  Decreased Interest 0 0 0 0 0  Down, Depressed, Hopeless 0 0 0 0 0  PHQ - 2 Score 0 0 0 0 0  Altered sleeping 0  0 0 0  Tired, decreased energy 0  0 0 0  Change in appetite 0  0 0 0  Feeling bad or failure about yourself  0  0 0 0  Trouble concentrating 0  0 0 0  Moving slowly or fidgety/restless 0  0 0 0  Suicidal thoughts 0  0 0 0  PHQ-9 Score 0  0 0 0  Difficult doing work/chores Not difficult at all    Not difficult at all    Hypertension:  BP Readings from Last 3 Encounters:  06/14/23 128/76  03/05/23 128/82  12/26/22 132/83    Obesity: Wt Readings  from Last 3 Encounters:  06/14/23 203 lb 11.2 oz (92.4 kg)  03/05/23 203 lb 4.8 oz (92.2 kg)  12/26/22 207 lb 2 oz (94 kg)   BMI Readings from Last 3 Encounters:  06/14/23 29.23 kg/m  03/05/23 29.17 kg/m  12/26/22 29.72 kg/m     Lipids:  Lab Results  Component Value Date   CHOL 129 02/07/2022   CHOL 127 09/24/2019   CHOL 141 09/19/2018   Lab Results  Component Value Date   HDL 63 02/07/2022   HDL 64 09/24/2019   HDL 63 09/19/2018   Lab Results  Component Value Date   LDLCALC 48 02/07/2022   LDLCALC 43 09/24/2019   LDLCALC 57 09/19/2018   Lab Results  Component Value Date   TRIG 101 02/07/2022   TRIG 114 09/24/2019   TRIG 133 09/19/2018   Lab Results  Component Value Date   CHOLHDL 2.0 02/07/2022   CHOLHDL 2.0 09/24/2019  CHOLHDL 2.2 09/19/2018   No results found for: "LDLDIRECT" Glucose:  Glucose, Bld  Date Value Ref Range Status  03/05/2023 103 (H) 65 - 99 mg/dL Final    Comment:    .            Fasting reference interval . For someone without known diabetes, a glucose value between 100 and 125 mg/dL is consistent with prediabetes and should be confirmed with a follow-up test. .   10/10/2022 83 65 - 99 mg/dL Final    Comment:    .            Fasting reference interval .   10/05/2022 151 (H) 70 - 99 mg/dL Final    Comment:    Glucose reference range applies only to samples taken after fasting for at least 8 hours.    Flowsheet Row Video Visit from 04/30/2023 in [facility]  AUDIT-C Score 0      Married STD testing and prevention (HIV/chl/gon/syphilis): N/A Sexual history: one partner, no problems  Hep C Screening: 03/05/23 Skin cancer: Discussed monitoring for atypical lesions, a few dry patches that are stable  Colorectal cancer: 12/18/12, scheduled Nov 8 th 2024  Prostate cancer:   Lab Results  Component Value Date   PSA 1.0 09/19/2018   PSA 0.8 05/16/2017     Lung cancer:  Low Dose CT Chest  recommended if Age 37-80 years, 30 pack-year currently smoking OR have quit w/in 15years. Patient is not candidate for screening   AAA: The USPSTF recommends one-time screening with ultrasonography in men ages 69 to 75 years who have ever smoked. Patient  is not  candidate for screening  ECG:  10/04/22  Vaccines:   RSV: discussed with patient  Tdap: up to date Shingrix: up to date Pneumonia: up to date Flu: Due - he will [facility] it [facility] since he works there  COVID-19: up to date  Advanced Care Planning: A voluntary discussion about advance care planning including the explanation and discussion of advance directives.  Discussed health care proxy and Living will, and the patient was able to identify a health care proxy as wife.  Patient does not have a living will and power of attorney of health care   Patient Active Problem List   Diagnosis Date Noted   Atherosclerosis of aorta (HCC) 03/05/2023   DOE (dyspnea on exertion) 10/10/2022   Pulmonary nodule 10/05/2022   History of heart artery stent 02/07/2022   History of pneumonia 02/07/2022   Moderate persistent asthma without complication 02/07/2022   History of shingles 02/07/2022   Fever blister 02/07/2022   Prediabetes 09/20/2018   Hypertension 04/12/2015   GERD (gastroesophageal reflux disease) 03/22/2015   Anxiety 03/22/2015   Pure hypercholesterolemia 12/10/2012   Family history of ischemic heart disease (IHD) 10/24/2012   History of fracture of rib 09/04/2011   Coronary artery disease involving native coronary artery of native heart without angina pectoris 08/01/2007    Past Surgical History:  Procedure Laterality Date   ANKLE FRACTURE SURGERY     CARDIAC CATHETERIZATION  10/02/2009   CARDIAC CATHETERIZATION  10/02/2006   CORONARY STENT PLACEMENT  2008 & 2011   FRACTURE SURGERY      Family History  Problem Relation Age of Onset   Parkinson's disease Mother    Heart disease Mother    Cancer Father     Social History    Socioeconomic History   Marital status: Married    Spouse name:  Darel Hong   Number of children: 2   Years of education: 12   Highest education level: Some college, no degree  Occupational History   Occupation: self employed  Tobacco Use   Smoking status: Never   Smokeless tobacco: Never  Vaping Use   Vaping status: Never Used  Substance and Sexual Activity   Alcohol use: Not Currently    Comment: occasional wine   Drug use: No   Sexual activity: Yes    Partners: Female    Birth control/protection: None  Other Topics Concern   Not on file  Social History Narrative   Used to own an Scientist, 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 , 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    Social Determinants of Corporate investment banker Strain: Low Risk  (03/05/2023)   Overall Financial Resource Strain (CARDIA)    Difficulty of Paying Living Expenses: Not hard at all  Food Insecurity: No Food Insecurity (03/05/2023)   Hunger Vital Sign    Worried About Running Out of Food in the Last Year: Never true    Ran Out of Food in the Last Year: Never true  Transportation Needs: No Transportation Needs (03/05/2023)   PRAPARE - Administrator, Civil Service (Medical): No    Lack of Transportation (Non-Medical): No  Physical Activity: Sufficiently Active (03/05/2023)   Exercise Vital Sign    Days of Exercise per Week: 5 days    Minutes of Exercise per Session: 150+ min  Stress: No Stress Concern Present (03/05/2023)   [employer] of Occupational Health - Occupational Stress Questionnaire    Feeling of Stress : Not at all  Social Connections: Socially Integrated (03/05/2023)   Social Connection and Isolation Panel [NHANES]    Frequency of Communication with Friends and Family: More than three times a week    Frequency of Social Gatherings with Friends and Family: Once a week    Attends Religious Services: More than 4 times per year    Active Member of [employer] or Organizations: Yes     Attends Banker Meetings: 1 to 4 times per year    Marital Status: Married  Catering manager Violence: Not At Risk (06/14/2023)   Humiliation, Afraid, Rape, and Kick questionnaire    Fear of Current or Ex-Partner: No    Emotionally Abused: No    Physically Abused: No    Sexually Abused: No     Current Outpatient Medications:    albuterol (VENTOLIN HFA) 108 (90 Base) MCG/ACT inhaler, TAKE 2 PUFFS BY MOUTH EVERY 6 HOURS AS NEEDED FOR WHEEZE OR SHORTNESS OF BREATH, Disp: 8.5 each, Rfl: 1   clopidogrel (PLAVIX) 75 MG tablet, Take 1 tablet (75 mg total) by mouth daily., Disp: 90 tablet, Rfl: 1   escitalopram (LEXAPRO) 10 MG tablet, TAKE 1 TABLET BY MOUTH EVERY DAY, Disp: 30 tablet, Rfl: 0   ezetimibe (ZETIA) 10 MG tablet, TAKE 1 TABLET BY MOUTH EVERY DAY, Disp: 30 tablet, Rfl: 0   fenofibrate (TRICOR) 145 MG tablet, TAKE 1 TABLET BY MOUTH EVERY DAY, Disp: 30 tablet, Rfl: 0   fluticasone furoate-vilanterol (BREO ELLIPTA) 100-25 MCG/ACT AEPB, Inhale 1 puff into the lungs daily., Disp: 90 each, Rfl: 3   ipratropium-albuterol (DUONEB) 0.5-2.5 (3) MG/3ML SOLN, Take 3 mLs by nebulization every 6 (six) hours as needed (coughing fits, bronchitis following CAP with hx of asthma)., Disp: 360 mL, Rfl: 0   nebivolol (BYSTOLIC) 5 MG tablet, TAKE 1  TABLET (5 MG) BY MOUTH DAILY. FUTURE REFILL REQUEST WILL NEED TO BE SENT TO NEW CARDIOLOGIST, Disp: 30 tablet, Rfl: 0   pantoprazole (PROTONIX) 40 MG tablet, TAKE 1 TABLET BY MOUTH EVERY DAY, Disp: 30 tablet, Rfl: 0   rosuvastatin (CRESTOR) 40 MG tablet, TAKE 1 TABLET BY MOUTH EVERY DAY, Disp: 30 tablet, Rfl: 0  Allergies  Allergen Reactions   Molds & Smuts Shortness Of Breath   Ambrosia Trifida (Tall Ragweed) Allergy Skin Test    Fish Oil Hives   Gramineae Pollens    Peanut Oil    Tetracyclines & Related Other (See Comments)    Intolerance - stomach upset     ROS  Constitutional: Negative for fever or weight change.  Respiratory:  Negative for cough and shortness of breath.   Cardiovascular: Negative for chest pain or palpitations.  Gastrointestinal: Negative for abdominal pain, no bowel changes.  Musculoskeletal: Negative for gait problem or joint swelling.  Skin: Negative for rash.  Neurological: Negative for dizziness or headache.  No other specific complaints in a complete review of systems (except as listed in HPI above).    Objective  Vitals:   06/14/23 1038  BP: 128/76  Pulse: 69  Resp: 16  Temp: 97.9 F (36.6 C)  TempSrc: Oral  SpO2: 94%  Weight: 203 lb 11.2 oz (92.4 kg)  Height: 5\' 10"  (1.778 m)    Body mass index is 29.23 kg/m.  Physical Exam   Constitutional: Patient appears well-developed and well-nourished. No distress.  HENT: Head: Normocephalic and atraumatic. Ears: B TMs ok, no erythema or effusion; Nose: Nose normal. Mouth/Throat: Oropharynx is clear and moist. No oropharyngeal exudate.  Eyes: Conjunctivae and EOM are normal. Pupils are equal, round, and reactive to light. No scleral icterus.  Neck: Normal range of motion. Neck supple. No JVD present. No thyromegaly present.  Cardiovascular: Normal rate, regular rhythm and normal heart sounds.  No murmur heard. No BLE edema. Pulmonary/Chest: Effort normal and breath sounds normal. No respiratory distress. Abdominal: Soft. Bowel sounds are normal, no distension. There is no tenderness. no masses MALE GENITALIA: Normal descended testes bilaterally, no masses palpated, no hernias, no lesions, no discharge RECTAL: Prostate very large in size, normal  consistency, no rectal masses or hemorrhoids Musculoskeletal: Normal range of motion, no joint effusions. No gross deformities Neurological: he is alert and oriented to person, place, and time. No cranial nerve deficit. Coordination, balance, strength, speech and gait are normal.  Skin: Skin is warm and dry. No rash noted. No erythema.  Psychiatric: Patient has a normal mood and affect.  behavior is normal. Judgment and thought content normal.    Recent Results (from the past 2160 hour(s))  [facility] Molecular Screen- Blood ([facility])     Status: None   Collection Time: 04/27/23  2:45 PM  Result Value Ref Range   Genetic Analysis Overall Interpretation Negative    Genetic Disease Assessed      [facility] Tier One Population Screen is a screening test that analyzes 11 genes related to hereditary breast and ovarian cancer (HBOC) syndrome, Lynch syndrome, and familial hypercholesterolemia. This test only reports clinically significant pathogenic and  likely pathogenic variants, unlike diagnostic testing, which also reports variants of uncertain significance (VUS). In addition, analysis of the PMS2 gene excludes exons 11-15, which overlap with a known pseudogene (PMS2CL).    Genetic Analysis Report      No pathogenic or likely pathogenic variants were detected in the genes analyzed by this test.Genetic  test results should be interpreted in the context of an individual's personal medical and family history. Alteration to medical management is NOT  recommended based solely on this result. Clinical correlation is advised.Additional Considerations- This is a screening test; individuals may still carry pathogenic or likely pathogenic variant(s) in the tested genes that are not detected by this test.-  For individuals at risk for these or other related conditions based on factors including personal or family history, diagnostic testing is recommended.- The absence of pathogenic or likely pathogenic variant(s) in the analyzed genes, while reassuring,  does not eliminate the possibility of a hereditary condition; there are other variants and genes associated with heart disease and hereditary cancer that are not included in this test.    Genes Tested Reported     Comment: BRCA1, BRCA2, MLH1, MSH2, MSH6, PMS2, EPCAM, APOB, LDLR, LDLRAP1, PCSK9   Disclaimer Reported     Comment: This  test was developed and validated by [facility] This test has not been cleared or approved by the [place] (FDA). The [facility] laboratory is accredited by the College of American Pathologists (CAP) and certified under  the Clinical Laboratory Improvement Amendments (CLIA #: [id number]) to perform high-complexity clinical tests. This test is used for clinical purposes. It should not be regarded as investigational or for research.    Sequencing Location Reported     Comment: Sequencing done at [facility] [address], [address], [place], [place] [address] (CLIA# [id number])   Interpretation Methods and Limitations Reported     Comment: Extracted DNA is enriched for targeted regions and then sequenced using the [facility] Exome+ (R) assay on an Illumina DNA sequencing system. Data is then aligned to a modified version of GRCh38 and all genes are analyzed using the MANE transcript and MANE  Plus Clinical transcript, when available. Small variant calling is completed using a customized version of [employer]'s DNAseq software, augmented by a proprietary small variant caller for difficult variants. Copy number variants (CNVs) are then called  using a proprietary bioinformatics pipeline based on depth analysis with a comparison to similarly sequenced samples. Analysis of the PMS2 gene is limited to exons 1-10; exons 11-15. The interpretation and reporting of variants in APOB, PCSK9, and LDLR  is specific to familial hypercholesterolemia; variants associated with hypobetalipoproteinemia are not included. Interpretation is based upon guidelines published by the [employer] of [employer] and Genomics [employer]) and the Ass ociation for  Molecular Pathology (AMP) or their modification by [employer] when available. Interpretation is limited to the transcripts indicated on the report and +/- 10 bp into intronic regions, except as noted below. [facility]  variant  classifications include pathogenic, likely pathogenic, variant of uncertain significance (VUS), likely benign, and benign. Only variants classified as pathogenic and likely pathogenic are included in the report. All reported variants are confirmed  through secondary manual inspection of DNA sequence data or orthogonal testing. Risk estimations and management guidelines included in this report are based on analysis of primary literature and recommendations of applicable professional societies, and  should be regarded as approximations.Based on validation studies, this assay delivers > 99% sensitivity and specificity for single nucleotide variants and insertions and deletions (indels) up to 20 bp. Larger indels and complex v ariants are also reported  but sensitivity may be reduced. Based on validation studies, this assay delivers > 99% sensitivity to multi-exon CNVs and > 90% sensitivity to single-exon CNVs. This test may not detect  variants in challenging regions (such as short tandem repeats,  homopolymer runs, and segment duplications), sub-exonic CNVs, chromosomal aneuploidy, or variants in the presence of mosaicism. Phasing will be attempted and reported, when possible. Structural rearrangements such as inversions, translocations, and gene  conversions are not tested in this assay unless explicitly indicated. Additionally, deep intronic, promoter, and enhancer regions may not be covered. It is important to note that this is a screening test and cannot detect all disease-causing variants. A  negative result does not guarantee the absence of a rare, undetectable variant in the genes analyzed; consider using a diagnostic test if there is significant personal and/or family history of one of the condi tions analyzed by this test. Any potential  incidental findings outside of these genes and conditions will not be identified, nor reported. The results of a genetic test may be influenced by various  factors, including bone marrow transplantation, blood transfusions, or in rare cases,  hematolymphoid neoplasms. Gene Specific Notes: BRCA1: sequencing analysis extends to CDS +/-20 bp; BRCA2: sequencing analysis extends to CDS +/-20 bp. EPCAM: analysis is limited to CNV of exons 8-9; MLH1: analysis includes CNV of the promoter.Gardenia Phlegm, PhD, [email or web address]      Fall Risk:    06/14/2023   10:31 AM 04/30/2023   10:16 AM 10/17/2022    2:33 PM 10/10/2022    1:15 PM 08/11/2022    9:39 AM  Fall Risk   Falls in the past year? 0 0 0 0 0  Number falls in past yr: 0 0 0 0 0  Injury with Fall? 0 0 0 0 0  Risk for fall due to : No Fall Risks  No Fall Risks No Fall Risks No Fall Risks  Follow up Falls prevention discussed;Education provided;Falls evaluation completed  Falls prevention discussed Falls prevention discussed;Education provided;Falls evaluation completed Falls prevention discussed;Education provided;Falls evaluation completed     Assessment & Plan  1. Well adult exam   2. Dyslipidemia  - Lipid panel  3. Lower urinary tract symptoms (LUTS)  - PSA  4. Long-term use of high-risk medication  - CBC with Differential/Platelet - COMPLETE METABOLIC PANEL WITH GFR  5. Hyperglycemia  - Hemoglobin A1c    -Prostate cancer screening and PSA options (with potential risks and benefits of testing vs not testing) were discussed along with recent recs/guidelines. -USPSTF grade A and B recommendations reviewed with patient; age-appropriate recommendations, preventive care, screening tests, etc discussed and encouraged; healthy living encouraged; see AVS for patient education given to patient -Discussed importance of 150 minutes of physical activity weekly, eat two servings of fish weekly, eat one serving of tree nuts ( cashews, pistachios, pecans, almonds.Marland Kitchen) every other day, eat 6 servings of fruit/vegetables daily and drink plenty of water and avoid sweet beverages.   -Reviewed Health Maintenance: yes

## 2023-06-14 ENCOUNTER — Ambulatory Visit (INDEPENDENT_AMBULATORY_CARE_PROVIDER_SITE_OTHER): Payer: Medicare Other | Admitting: Family Medicine

## 2023-06-14 ENCOUNTER — Encounter: Payer: Self-pay | Admitting: Family Medicine

## 2023-06-14 VITALS — BP 128/76 | HR 69 | Temp 97.9°F | Resp 16 | Ht 70.0 in | Wt 203.7 lb

## 2023-06-14 DIAGNOSIS — N138 Other obstructive and reflux uropathy: Secondary | ICD-10-CM | POA: Diagnosis not present

## 2023-06-14 DIAGNOSIS — E785 Hyperlipidemia, unspecified: Secondary | ICD-10-CM

## 2023-06-14 DIAGNOSIS — Z0000 Encounter for general adult medical examination without abnormal findings: Secondary | ICD-10-CM

## 2023-06-14 DIAGNOSIS — N401 Enlarged prostate with lower urinary tract symptoms: Secondary | ICD-10-CM | POA: Insufficient documentation

## 2023-06-14 DIAGNOSIS — Z79899 Other long term (current) drug therapy: Secondary | ICD-10-CM

## 2023-06-14 DIAGNOSIS — R739 Hyperglycemia, unspecified: Secondary | ICD-10-CM

## 2023-06-15 LAB — CBC WITH DIFFERENTIAL/PLATELET
Absolute Monocytes: 540 {cells}/uL (ref 200–950)
Basophils Absolute: 41 {cells}/uL (ref 0–200)
Basophils Relative: 0.9 %
Eosinophils Absolute: 162 {cells}/uL (ref 15–500)
Eosinophils Relative: 3.6 %
HCT: 42.8 % (ref 38.5–50.0)
Hemoglobin: 14.3 g/dL (ref 13.2–17.1)
Lymphs Abs: 828 {cells}/uL — ABNORMAL LOW (ref 850–3900)
MCH: 30.8 pg (ref 27.0–33.0)
MCHC: 33.4 g/dL (ref 32.0–36.0)
MCV: 92 fL (ref 80.0–100.0)
MPV: 9.7 fL (ref 7.5–12.5)
Monocytes Relative: 12 %
Neutro Abs: 2930 {cells}/uL (ref 1500–7800)
Neutrophils Relative %: 65.1 %
Platelets: 290 10*3/uL (ref 140–400)
RBC: 4.65 10*6/uL (ref 4.20–5.80)
RDW: 12.9 % (ref 11.0–15.0)
Total Lymphocyte: 18.4 %
WBC: 4.5 10*3/uL (ref 3.8–10.8)

## 2023-06-15 LAB — COMPLETE METABOLIC PANEL WITH GFR
AG Ratio: 1.8 (calc) (ref 1.0–2.5)
ALT: 25 U/L (ref 9–46)
AST: 30 U/L (ref 10–35)
Albumin: 4.6 g/dL (ref 3.6–5.1)
Alkaline phosphatase (APISO): 55 U/L (ref 35–144)
BUN: 21 mg/dL (ref 7–25)
CO2: 28 mmol/L (ref 20–32)
Calcium: 9.5 mg/dL (ref 8.6–10.3)
Chloride: 104 mmol/L (ref 98–110)
Creat: 1.12 mg/dL (ref 0.70–1.35)
Globulin: 2.5 g/dL (ref 1.9–3.7)
Glucose, Bld: 100 mg/dL — ABNORMAL HIGH (ref 65–99)
Potassium: 4.2 mmol/L (ref 3.5–5.3)
Sodium: 140 mmol/L (ref 135–146)
Total Bilirubin: 0.4 mg/dL (ref 0.2–1.2)
Total Protein: 7.1 g/dL (ref 6.1–8.1)
eGFR: 72 mL/min/{1.73_m2} (ref >=60)

## 2023-06-15 LAB — LIPID PANEL
Cholesterol: 128 mg/dL (ref <200)
HDL: 65 mg/dL (ref >=40)
LDL Cholesterol (Calc): 47 mg/dL
Non-HDL Cholesterol (Calc): 63 mg/dL (ref <130)
Total CHOL/HDL Ratio: 2 (calc) (ref <5.0)
Triglycerides: 77 mg/dL (ref <150)

## 2023-06-15 LAB — HEMOGLOBIN A1C
Hgb A1c MFr Bld: 6.2 %{Hb} — ABNORMAL HIGH (ref <5.7)
Mean Plasma Glucose: 131 mg/dL
eAG (mmol/L): 7.3 mmol/L

## 2023-06-15 LAB — PSA: PSA: 0.7 ng/mL (ref <=4.00)

## 2023-06-19 ENCOUNTER — Other Ambulatory Visit: Payer: Self-pay | Admitting: Family Medicine

## 2023-06-19 DIAGNOSIS — I10 Essential (primary) hypertension: Secondary | ICD-10-CM

## 2023-06-30 ENCOUNTER — Encounter: Payer: Self-pay | Admitting: Family Medicine

## 2023-07-02 ENCOUNTER — Other Ambulatory Visit: Payer: Self-pay | Admitting: Nurse Practitioner

## 2023-07-13 ENCOUNTER — Other Ambulatory Visit: Payer: Self-pay | Admitting: Family Medicine

## 2023-07-13 DIAGNOSIS — I10 Essential (primary) hypertension: Secondary | ICD-10-CM

## 2023-07-23 ENCOUNTER — Other Ambulatory Visit: Payer: Self-pay | Admitting: Family Medicine

## 2023-07-23 DIAGNOSIS — J4540 Moderate persistent asthma, uncomplicated: Secondary | ICD-10-CM

## 2023-07-23 DIAGNOSIS — K219 Gastro-esophageal reflux disease without esophagitis: Secondary | ICD-10-CM

## 2023-08-02 ENCOUNTER — Telehealth: Payer: Self-pay | Admitting: *Deleted

## 2023-08-02 NOTE — Telephone Encounter (Signed)
Received blood thinner request from PCP who is the one prescribing the Plavix.  Patient may stop Plavix 5 days prior to procedure. Patient is to restart blood thinner 1 day after procedure.  Send patient a MyChart message since it is the best way I have able to communicate with him.

## 2023-08-03 ENCOUNTER — Other Ambulatory Visit: Payer: Self-pay | Admitting: Family Medicine

## 2023-08-03 DIAGNOSIS — I10 Essential (primary) hypertension: Secondary | ICD-10-CM

## 2023-08-09 ENCOUNTER — Encounter: Payer: Self-pay | Admitting: Gastroenterology

## 2023-08-10 ENCOUNTER — Ambulatory Visit: Payer: Medicare Other | Admitting: Anesthesiology

## 2023-08-10 ENCOUNTER — Encounter
Admission: RE | Disposition: A | Discharge status: Home / Self Care | Payer: Self-pay | Source: Home / Self Care | Attending: Gastroenterology

## 2023-08-10 ENCOUNTER — Ambulatory Visit
Admission: RE | Admit: 2023-08-10 | Discharge: 2023-08-10 | Disposition: A | Discharge status: Home / Self Care | Payer: Medicare Other | Attending: Gastroenterology | Admitting: Gastroenterology

## 2023-08-10 DIAGNOSIS — J45909 Unspecified asthma, uncomplicated: Secondary | ICD-10-CM | POA: Diagnosis not present

## 2023-08-10 DIAGNOSIS — K259 Gastric ulcer, unspecified as acute or chronic, without hemorrhage or perforation: Secondary | ICD-10-CM | POA: Insufficient documentation

## 2023-08-10 DIAGNOSIS — D124 Benign neoplasm of descending colon: Secondary | ICD-10-CM | POA: Insufficient documentation

## 2023-08-10 DIAGNOSIS — Z1211 Encounter for screening for malignant neoplasm of colon: Secondary | ICD-10-CM | POA: Diagnosis not present

## 2023-08-10 DIAGNOSIS — I2510 Atherosclerotic heart disease of native coronary artery without angina pectoris: Secondary | ICD-10-CM | POA: Diagnosis not present

## 2023-08-10 DIAGNOSIS — K2970 Gastritis, unspecified, without bleeding: Secondary | ICD-10-CM | POA: Diagnosis not present

## 2023-08-10 DIAGNOSIS — K219 Gastro-esophageal reflux disease without esophagitis: Secondary | ICD-10-CM | POA: Diagnosis not present

## 2023-08-10 DIAGNOSIS — I10 Essential (primary) hypertension: Secondary | ICD-10-CM | POA: Insufficient documentation

## 2023-08-10 DIAGNOSIS — Z955 Presence of coronary angioplasty implant and graft: Secondary | ICD-10-CM | POA: Diagnosis not present

## 2023-08-10 DIAGNOSIS — Z13810 Encounter for screening for upper gastrointestinal disorder: Secondary | ICD-10-CM | POA: Diagnosis not present

## 2023-08-10 DIAGNOSIS — D122 Benign neoplasm of ascending colon: Secondary | ICD-10-CM | POA: Diagnosis not present

## 2023-08-10 DIAGNOSIS — K635 Polyp of colon: Secondary | ICD-10-CM | POA: Diagnosis not present

## 2023-08-10 HISTORY — PX: ESOPHAGOGASTRODUODENOSCOPY (EGD) WITH PROPOFOL: SHX5813

## 2023-08-10 HISTORY — PX: COLONOSCOPY WITH PROPOFOL: SHX5780

## 2023-08-10 HISTORY — PX: BIOPSY: SHX5522

## 2023-08-10 SURGERY — ESOPHAGOGASTRODUODENOSCOPY (EGD) WITH PROPOFOL
Anesthesia: General

## 2023-08-10 MED ORDER — DEXMEDETOMIDINE HCL IN NACL 80 MCG/20ML IV SOLN
INTRAVENOUS | Status: DC | PRN
  Administered 2023-08-10: 8 ug via INTRAVENOUS

## 2023-08-10 MED ORDER — PROPOFOL 10 MG/ML IV BOLUS
INTRAVENOUS | Status: DC | PRN
  Administered 2023-08-10: 30 mg via INTRAVENOUS
  Administered 2023-08-10: 20 mg via INTRAVENOUS
  Administered 2023-08-10: 80 mg via INTRAVENOUS

## 2023-08-10 MED ORDER — SODIUM CHLORIDE 0.9 % IV SOLN
INTRAVENOUS | Status: DC
  Administered 2023-08-10: 20 mL/h via INTRAVENOUS

## 2023-08-10 MED ORDER — LIDOCAINE HCL (CARDIAC) PF 100 MG/5ML IV SOSY
PREFILLED_SYRINGE | INTRAVENOUS | Status: DC | PRN
  Administered 2023-08-10: 40 mg via INTRAVENOUS

## 2023-08-10 MED ORDER — PROPOFOL 500 MG/50ML IV EMUL
INTRAVENOUS | Status: DC | PRN
  Administered 2023-08-10: 140 ug/kg/min via INTRAVENOUS

## 2023-08-10 NOTE — Op Note (Signed)
[facility] Patient Name: Paul Page Procedure Date: 08/10/2023 8:19 AM MRN: [id number] Account #: [id number] Date of Birth: [birth year] Admit Type: Outpatient Age: 67 Room: [facility] ENDO ROOM 4 Gender: Male Note Status: Finalized Instrument Name: Laurette Schimke [id number] Procedure:             Upper GI endoscopy Indications:           Screening for Barrett's esophagus Providers:             Wyline Mood MD, MD Medicines:             Monitored Anesthesia Care Complications:         No immediate complications. Procedure:             Pre-Anesthesia Assessment:                        - Prior to the procedure, a History and Physical was                         performed, and patient medications, allergies and                         sensitivities were reviewed. The patient's tolerance                         of previous anesthesia was reviewed.                        - The risks and benefits of the procedure and the                         sedation options and risks were discussed with the                         patient. All questions were answered and informed                         consent was obtained.                        - ASA Grade Assessment: II - A patient with mild                         systemic disease.                        After obtaining informed consent, the endoscope was                         passed under direct vision. Throughout the procedure,                         the patient's blood pressure, pulse, and oxygen                         saturations were monitored continuously. The Endoscope                         was introduced through the mouth, and advanced to the  third part of duodenum. The upper GI endoscopy was                         accomplished with ease. The patient tolerated the                         procedure well. Findings:      The examined duodenum was normal.      The esophagus was  normal.      One non-bleeding superficial gastric ulcer with no stigmata of bleeding       was found at the pylorus. The lesion was 6 mm in largest dimension.       Biopsies were taken with a cold forceps for histology.      The cardia and gastric fundus were normal on retroflexion. Impression:            - Normal examined duodenum.                        - Normal esophagus.                        - Non-bleeding gastric ulcer with no stigmata of                         bleeding. Biopsied. Recommendation:        - Await pathology results.                        - Perform a colonoscopy today. Procedure Code(s):     --- Professional ---                        [id number], Esophagogastroduodenoscopy, flexible,                         transoral; with biopsy, single or multiple Diagnosis Code(s):     --- Professional ---                        K25.9, Gastric ulcer, unspecified as acute or chronic,                         without hemorrhage or perforation                        Z13.810, Encounter for screening for upper                         gastrointestinal disorder CPT copyright 2022 American Medical Association. All rights reserved. The codes documented in this report are preliminary and upon coder review may  be revised to meet current compliance requirements. Wyline Mood, MD Wyline Mood MD, MD 08/10/2023 8:34:27 AM This report has been signed electronically. Number of Addenda: 0 Note Initiated On: 08/10/2023 8:19 AM Estimated Blood Loss:  Estimated blood loss: none.      [facility]

## 2023-08-10 NOTE — H&P (Signed)
Wyline Mood, MD [address], [address], [place], [place], [address] [address], [address], [place], [place], [address] Phone: [phone]  Fax: [phone]  Primary Care Physician:  Alba Cory, MD   Pre-Procedure History & Physical: HPI:  Paul Page is a 67 y.o. male is here for an endoscopy and colonoscopy    Past Medical History:  Diagnosis Date   Allergy    Anxiety    Arthritis    Asthma    Coronary artery disease    Three-vessel coronary artery disease diagnosed in October 2008 with normal ejection fraction. PCI and drug-eluting stent placement to the LAD(3.0 x 8 mm Promus), left circumflex(2.5 x 12 mm Promus) and right coronary artery(3.5 x 12 mm Promus). Unstable angina in 2011. PCI of RCA with a 3.5 x 15 mm Promus and PCI of LAD with a 2.5 x 28 mm Promus drug-eluting stents    GERD (gastroesophageal reflux disease)    Heart disease    Hyperlipidemia    Hypertension    Traumatic pneumothorax 09/04/2011   Overview:   08/22/11  S/P fall, right.    Past Surgical History:  Procedure Laterality Date   ANKLE FRACTURE SURGERY     CARDIAC CATHETERIZATION  10/02/2009   CARDIAC CATHETERIZATION  10/02/2006   CORONARY STENT PLACEMENT  2008 & 2011   FRACTURE SURGERY      Prior to Admission medications   Medication Sig Start Date End Date Taking? Authorizing Provider  albuterol (VENTOLIN HFA) 108 (90 Base) MCG/ACT inhaler TAKE 2 PUFFS BY MOUTH EVERY 6 HOURS AS NEEDED FOR WHEEZE OR SHORTNESS OF BREATH 03/06/22  Yes Berniece Salines, FNP  clopidogrel (PLAVIX) 75 MG tablet Take 1 tablet (75 mg total) by mouth daily. 03/05/23  Yes Sowles, Danna Hefty, MD  escitalopram (LEXAPRO) 10 MG tablet TAKE 1 TABLET BY MOUTH EVERY DAY 06/07/23  Yes Sowles, Danna Hefty, MD  ezetimibe (ZETIA) 10 MG tablet TAKE 1 TABLET BY MOUTH EVERY DAY 06/07/23  Yes Sowles, Danna Hefty, MD  fenofibrate (TRICOR) 145 MG tablet TAKE 1 TABLET BY MOUTH EVERY DAY 06/07/23  Yes Sowles, Danna Hefty, MD  ipratropium-albuterol  (DUONEB) 0.5-2.5 (3) MG/3ML SOLN Take 3 mLs by nebulization every 6 (six) hours as needed (coughing fits, bronchitis following CAP with hx of asthma). 10/06/22  Yes Wieting, Quinlan, MD  nebivolol (BYSTOLIC) 5 MG tablet TAKE 1 TABLET (5 MG) BY MOUTH DAILY. FUTURE REFILL REQUEST WILL NEED TO BE SENT TO NEW CARDIOLOGIST 06/07/23  Yes Sowles, Danna Hefty, MD  pantoprazole (PROTONIX) 40 MG tablet TAKE 1 TABLET BY MOUTH EVERY DAY 06/07/23  Yes Sowles, Danna Hefty, MD  rosuvastatin (CRESTOR) 40 MG tablet TAKE 1 TABLET BY MOUTH EVERY DAY 06/07/23  Yes Sowles, Danna Hefty, MD  fluticasone furoate-vilanterol (BREO ELLIPTA) 100-25 MCG/ACT AEPB TAKE 1 PUFF BY MOUTH EVERY DAY 07/24/23   Alba Cory, MD    Allergies as of 06/06/2023 - Review Complete 03/05/2023  Allergen Reaction Noted   Molds & smuts Shortness Of Breath 03/12/2013   Ambrosia trifida (tall ragweed) allergy skin test  02/24/2020   Fish oil Hives 02/23/2017   Gramineae pollens  02/24/2020   Peanut oil  03/22/2015   Tetracyclines & related Other (See Comments) 03/22/2015    Family History  Problem Relation Age of Onset   Parkinson's disease Mother    Heart disease Mother    Cancer Father     Social History   Socioeconomic History   Marital status: Married    Spouse name: Paul Page  Number of children: 2   Years of education: 12   Highest education level: Some college, no degree  Occupational History   Occupation: self employed  Tobacco Use   Smoking status: Never   Smokeless tobacco: Never  Vaping Use   Vaping status: Never Used  Substance and Sexual Activity   Alcohol use: Not Currently    Comment: occasional wine   Drug use: No   Sexual activity: Yes    Partners: Female    Birth control/protection: None  Other Topics Concern   Not on file  Social History Narrative   Used to own an Scientist, 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 , 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    Social Determinants of Research scientist (physical sciences) Strain: Low Risk  (03/05/2023)   Overall Financial Resource Strain (CARDIA)    Difficulty of Paying Living Expenses: Not hard at all  Food Insecurity: No Food Insecurity (03/05/2023)   Hunger Vital Sign    Worried About Running Out of Food in the Last Year: Never true    Ran Out of Food in the Last Year: Never true  Transportation Needs: No Transportation Needs (03/05/2023)   PRAPARE - Administrator, Civil Service (Medical): No    Lack of Transportation (Non-Medical): No  Physical Activity: Sufficiently Active (03/05/2023)   Exercise Vital Sign    Days of Exercise per Week: 5 days    Minutes of Exercise per Session: 150+ min  Stress: No Stress Concern Present (03/05/2023)   [employer] of Occupational Health - Occupational Stress Questionnaire    Feeling of Stress : Not at all  Social Connections: Socially Integrated (03/05/2023)   Social Connection and Isolation Panel [NHANES]    Frequency of Communication with Friends and Family: More than three times a week    Frequency of Social Gatherings with Friends and Family: Once a week    Attends Religious Services: More than 4 times per year    Active Member of [employer] or Organizations: Yes    Attends Banker Meetings: 1 to 4 times per year    Marital Status: Married  Catering manager Violence: Not At Risk (06/14/2023)   Humiliation, Afraid, Rape, and Kick questionnaire    Fear of Current or Ex-Partner: No    Emotionally Abused: No    Physically Abused: No    Sexually Abused: No    Review of Systems: See HPI, otherwise negative ROS  Physical Exam: BP (!) 142/84   Pulse 70   Temp (!) 96.1 F (35.6 C) (Temporal)   Resp 20   Ht 5\' 10"  (1.778 m)   Wt 90.7 kg   SpO2 96%   BMI 28.70 kg/m  General:   Alert,  pleasant and cooperative in NAD Head:  Normocephalic and atraumatic. Neck:  Supple; no masses or thyromegaly. Lungs:  Clear throughout to auscultation, normal respiratory effort.    Heart:  +S1,  +S2, Regular rate and rhythm, No edema. Abdomen:  Soft, nontender and nondistended. Normal bowel sounds, without guarding, and without rebound.   Neurologic:  Alert and  oriented x4;  grossly normal neurologically.  Impression/Plan: Paul Page is here for an endoscopy and colonoscopy  to be performed for  evaluation of colon cancer screening and screening for barrettes esophagus    Risks, benefits, limitations, and alternatives regarding endoscopy have been reviewed with the patient.  Questions have been answered.  All parties agreeable.   Sharlet Salina  Tobi Bastos, MD  08/10/2023, 8:18 AM

## 2023-08-10 NOTE — Op Note (Signed)
[facility] Patient Name: Amish Defenbaugh Procedure Date: 08/10/2023 8:19 AM MRN: [id number] Account #: [id number] Date of Birth: [birth year] Admit Type: Outpatient Age: 67 Room: [facility] ENDO ROOM 4 Gender: Male Note Status: Finalized Instrument Name: Prentice Docker [id number] Procedure:             Colonoscopy Indications:           Screening for colorectal malignant neoplasm Providers:             Wyline Mood MD, MD Medicines:             Monitored Anesthesia Care Complications:         No immediate complications. Procedure:             Pre-Anesthesia Assessment:                        - Prior to the procedure, a History and Physical was                         performed, and patient medications, allergies and                         sensitivities were reviewed. The patient's tolerance                         of previous anesthesia was reviewed.                        - The risks and benefits of the procedure and the                         sedation options and risks were discussed with the                         patient. All questions were answered and informed                         consent was obtained.                        - ASA Grade Assessment: II - A patient with mild                         systemic disease.                        After obtaining informed consent, the colonoscope was                         passed under direct vision. Throughout the procedure,                         the patient's blood pressure, pulse, and oxygen                         saturations were monitored continuously. The                         Colonoscope was introduced through the anus and  advanced to the the cecum, identified by the                         appendiceal orifice. The colonoscopy was performed                         with ease. The patient tolerated the procedure well.                         The quality of the bowel  preparation was excellent.                         The ileocecal valve, appendiceal orifice, and rectum                         were photographed. Findings:      The perianal and digital rectal examinations were normal.      Three sessile polyps were found in the ascending colon. The polyps were       4 to 5 mm in size. These polyps were removed with a cold snare.       Resection and retrieval were complete. To prevent bleeding after the       polypectomy, two hemostatic clips were successfully placed. Clip       manufacturer: [employer]. There was no bleeding at the end of the       procedure.      A 5 mm polyp was found in the descending colon. The polyp was sessile.       The polyp was removed with a cold snare. Resection and retrieval were       complete.      The exam was otherwise without abnormality on direct and retroflexion       views. Impression:            - Three 4 to 5 mm polyps in the ascending colon,                         removed with a cold snare. Resected and retrieved.                         Clips were placed. Clip manufacturer: [employer].                        - One 5 mm polyp in the descending colon, removed with                         a cold snare. Resected and retrieved.                        - The examination was otherwise normal on direct and                         retroflexion views. Recommendation:        - Discharge patient to home (with escort).                        -  Resume previous diet.                        - Continue present medications.                        - Await pathology results.                        - Repeat colonoscopy in 3 years for surveillance based                         on pathology results. Procedure Code(s):     --- Professional ---                        [phone], Colonoscopy, flexible; with removal of                         tumor(s), polyp(s), or other lesion(s) by snare                          technique Diagnosis Code(s):     --- Professional ---                        Z12.11, Encounter for screening for malignant neoplasm                         of colon                        D12.2, Benign neoplasm of ascending colon                        D12.4, Benign neoplasm of descending colon CPT copyright 2022 American Medical Association. All rights reserved. The codes documented in this report are preliminary and upon coder review may  be revised to meet current compliance requirements. Wyline Mood, MD Wyline Mood MD, MD 08/10/2023 8:55:02 AM This report has been signed electronically. Number of Addenda: 0 Note Initiated On: 08/10/2023 8:19 AM Scope Withdrawal Time: 0 hours 14 minutes 13 seconds  Total Procedure Duration: 0 hours 16 minutes 12 seconds  Estimated Blood Loss:  Estimated blood loss: none.      [facility]

## 2023-08-10 NOTE — Anesthesia Postprocedure Evaluation (Signed)
Anesthesia Post Note  Patient: [name]  Procedure(s) Performed: ESOPHAGOGASTRODUODENOSCOPY (EGD) WITH PROPOFOL COLONOSCOPY WITH PROPOFOL BIOPSY  Patient location during evaluation: Endoscopy Anesthesia Type: General Level of consciousness: awake and alert Pain management: pain level controlled Vital Signs Assessment: post-procedure vital signs reviewed and stable Respiratory status: spontaneous breathing, nonlabored ventilation, respiratory function stable and patient connected to nasal cannula oxygen Cardiovascular status: blood pressure returned to baseline and stable Postop Assessment: no apparent nausea or vomiting Anesthetic complications: no   No notable events documented.   Last Vitals:  Vitals:   08/10/23 0927 08/10/23 0937  BP: 136/71 133/71  Pulse: (!) 58 (!) 58  Resp: 14 (!) 22  Temp:    SpO2: 98% 98%    Last Pain:  Vitals:   08/10/23 0937  TempSrc:   PainSc: 0-No pain                 Louie Boston

## 2023-08-10 NOTE — Transfer of Care (Signed)
Immediate Anesthesia Transfer of Care Note  Patient: Paul Page  Procedure(s) Performed: ESOPHAGOGASTRODUODENOSCOPY (EGD) WITH PROPOFOL COLONOSCOPY WITH PROPOFOL BIOPSY  Patient Location: PACU  Anesthesia Type:General  Level of Consciousness: awake, alert , and oriented  Airway & Oxygen Therapy: Patient Spontanous Breathing  Post-op Assessment: Report given to RN and Post -op Vital signs reviewed and stable  Post vital signs: Reviewed and stable  Last Vitals:  Vitals Value Taken Time  BP 95/50 08/10/23 0857  Temp    Pulse 59 08/10/23 0858  Resp 19 08/10/23 0858  SpO2 97 % 08/10/23 0858  Vitals shown include unfiled device data.  Last Pain:  Vitals:   08/10/23 0857  TempSrc:   PainSc: 0-No pain         Complications: No notable events documented.

## 2023-08-10 NOTE — Anesthesia Preprocedure Evaluation (Signed)
Anesthesia Evaluation  Patient identified by MRN, date of birth, ID band Patient awake    Reviewed: Allergy & Precautions, NPO status , Patient's Chart, lab work & pertinent test results  History of Anesthesia Complications Negative for: history of anesthetic complications  Airway Mallampati: II  TM Distance: >3 FB Neck ROM: full    Dental no notable dental hx.    Pulmonary asthma    Pulmonary exam normal        Cardiovascular hypertension, On Medications + CAD, + Cardiac Stents and + DOE  Normal cardiovascular exam     Neuro/Psych  PSYCHIATRIC DISORDERS Anxiety     negative neurological ROS     GI/Hepatic Neg liver ROS,GERD  Controlled,,  Endo/Other  negative endocrine ROS    Renal/GU negative Renal ROS  negative genitourinary   Musculoskeletal   Abdominal   Peds  Hematology negative hematology ROS (+)   Anesthesia Other Findings Past Medical History: No date: Allergy No date: Anxiety No date: Arthritis No date: Asthma No date: Coronary artery disease     Comment:  Three-vessel coronary artery disease diagnosed in               October 2008 with normal ejection fraction. PCI and               drug-eluting stent placement to the LAD(3.0 x 8 mm               Promus), left circumflex(2.5 x 12 mm Promus) and right               coronary artery(3.5 x 12 mm Promus). Unstable angina in               2011. PCI of RCA with a 3.5 x 15 mm Promus and PCI of LAD              with a 2.5 x 28 mm Promus drug-eluting stents  No date: GERD (gastroesophageal reflux disease) No date: Heart disease No date: Hyperlipidemia No date: Hypertension 09/04/2011: Traumatic pneumothorax     Comment:  Overview:   08/22/11  S/P fall, right.  Past Surgical History: No date: ANKLE FRACTURE SURGERY 10/02/2009: CARDIAC CATHETERIZATION 10/02/2006: CARDIAC CATHETERIZATION 2008 & 2011: CORONARY STENT PLACEMENT No date: FRACTURE  SURGERY     Reproductive/Obstetrics negative OB ROS                             Anesthesia Physical Anesthesia Plan  ASA: 3  Anesthesia Plan: General   Post-op Pain Management: Minimal or no pain anticipated   Induction: Intravenous  PONV Risk Score and Plan: 1 and Propofol infusion and TIVA  Airway Management Planned: Natural Airway and Nasal Cannula  Additional Equipment:   Intra-op Plan:   Post-operative Plan:   Informed Consent: I have reviewed the patients History and Physical, chart, labs and discussed the procedure including the risks, benefits and alternatives for the proposed anesthesia with the patient or authorized representative who has indicated his/her understanding and acceptance.     Dental Advisory Given  Plan Discussed with: Anesthesiologist, CRNA and Surgeon  Anesthesia Plan Comments: (Patient consented for risks of anesthesia including but not limited to:  - adverse reactions to medications - risk of airway placement if required - damage to eyes, teeth, lips or other oral mucosa - nerve damage due to positioning  - sore throat or hoarseness - Damage to heart, brain, nerves, lungs, other  parts of body or loss of life  Patient voiced understanding and assent.)        Anesthesia Quick Evaluation

## 2023-08-13 ENCOUNTER — Encounter: Payer: Self-pay | Admitting: Gastroenterology

## 2023-08-13 LAB — SURGICAL PATHOLOGY

## 2023-08-16 ENCOUNTER — Ambulatory Visit
Admission: RE | Admit: 2023-08-16 | Discharge: 2023-08-16 | Disposition: A | Discharge status: Home / Self Care | Payer: Worker's Compensation | Source: Ambulatory Visit | Attending: Physician Assistant | Admitting: Physician Assistant

## 2023-08-16 ENCOUNTER — Other Ambulatory Visit: Payer: Self-pay | Admitting: Physician Assistant

## 2023-08-16 DIAGNOSIS — M25472 Effusion, left ankle: Secondary | ICD-10-CM | POA: Insufficient documentation

## 2023-08-16 DIAGNOSIS — Y990 Civilian activity done for income or pay: Secondary | ICD-10-CM

## 2023-08-16 DIAGNOSIS — W2203XA Walked into furniture, initial encounter: Secondary | ICD-10-CM | POA: Diagnosis not present

## 2023-08-17 ENCOUNTER — Ambulatory Visit: Payer: Medicare Other

## 2023-08-23 NOTE — Telephone Encounter (Signed)
It is a picture of pantoprazole (PROTONIX) 40 mg. Take 1 tablet every day.

## 2023-08-28 ENCOUNTER — Telehealth: Payer: Self-pay

## 2023-08-28 ENCOUNTER — Encounter: Payer: Self-pay | Admitting: Gastroenterology

## 2023-08-28 NOTE — Telephone Encounter (Signed)
Called and left  a message for call back. Put recall for 3 years.

## 2023-08-28 NOTE — Telephone Encounter (Signed)
Patient had colonoscopy and EGD on 08/10/2023 and is calling for the results

## 2023-08-29 NOTE — Telephone Encounter (Signed)
Called and left a message for call back. Sent a letter to patient

## 2023-08-29 NOTE — Progress Notes (Deleted)
Name: Amitai Cornely   MRN: [id number]    DOB: May 22, 1956   Date:08/29/2023       Progress Note  Subjective  Chief Complaint  No chief complaint on file.   HPI  Patient presents for annual CPE.  Diet: *** Exercise: ***  Last Eye Exam: *** Last Dental Exam: ***  Flowsheet Row Video Visit from 04/30/2023 in [facility]  AUDIT-C Score 0      Depression: Phq 9 is  {Desc; negative/positive:13464}    06/14/2023   10:31 AM 04/30/2023   10:17 AM 03/05/2023    8:38 AM 10/17/2022    2:33 PM 10/10/2022    1:15 PM  Depression screen PHQ 2/9  Decreased Interest 0 0 0 0 0  Down, Depressed, Hopeless 0 0 0 0 0  PHQ - 2 Score 0 0 0 0 0  Altered sleeping 0  0 0 0  Tired, decreased energy 0  0 0 0  Change in appetite 0  0 0 0  Feeling bad or failure about yourself  0  0 0 0  Trouble concentrating 0  0 0 0  Moving slowly or fidgety/restless 0  0 0 0  Suicidal thoughts 0  0 0 0  PHQ-9 Score 0  0 0 0  Difficult doing work/chores Not difficult at all    Not difficult at all   Hypertension: BP Readings from Last 3 Encounters:  08/10/23 133/71  06/14/23 128/76  03/05/23 128/82   Obesity: Wt Readings from Last 3 Encounters:  08/10/23 200 lb (90.7 kg)  06/14/23 203 lb 11.2 oz (92.4 kg)  03/05/23 203 lb 4.8 oz (92.2 kg)   BMI Readings from Last 3 Encounters:  08/10/23 28.70 kg/m  06/14/23 29.23 kg/m  03/05/23 29.17 kg/m     Vaccines:   HPV:  Tdap:  Shingrix:  Pneumonia:  Flu:  COVID-19:   Hep C Screening:  STD testing and prevention (HIV/chl/gon/syphilis):  Intimate partner violence: {Desc; negative/positive:13464} screen  Sexual History : Menstrual History/LMP/Abnormal Bleeding:  Discussed importance of follow up if any post-menopausal bleeding: {Response; yes/no/na:63}  Incontinence Symptoms: {Desc; negative/positive:13464} for symptoms   Breast cancer:  - Last Mammogram: *** - BRCA gene screening:   Osteoporosis Prevention :  Discussed high calcium and vitamin D supplementation, weight bearing exercises Bone density :{Response; yes/no/na:63}   Cervical cancer screening:   Skin cancer: Discussed monitoring for atypical lesions  Colorectal cancer: ***   Lung cancer:  Low Dose CT Chest recommended if Age 80-80 years, 20 pack-year currently smoking OR have quit w/in 15years. Patient {DOES NOT does:27190::"does not"} qualify for screen   ECG: ***  Advanced Care Planning: A voluntary discussion about advance care planning including the explanation and discussion of advance directives.  Discussed health care proxy and Living will, and the patient was able to identify a health care proxy as ***.  Patient {DOES_DOES WGN:56213} have a living will and power of attorney of health care   Lipids: Lab Results  Component Value Date   CHOL 128 06/14/2023   CHOL 129 02/07/2022   CHOL 127 09/24/2019   Lab Results  Component Value Date   HDL 65 06/14/2023   HDL 63 02/07/2022   HDL 64 09/24/2019   Lab Results  Component Value Date   LDLCALC 47 06/14/2023   LDLCALC 48 02/07/2022   LDLCALC 43 09/24/2019   Lab Results  Component Value Date   TRIG 77 06/14/2023   TRIG 101 02/07/2022   TRIG  114 09/24/2019   Lab Results  Component Value Date   CHOLHDL 2.0 06/14/2023   CHOLHDL 2.0 02/07/2022   CHOLHDL 2.0 09/24/2019   No results found for: "LDLDIRECT"  Glucose: Glucose, Bld  Date Value Ref Range Status  06/14/2023 100 (H) 65 - 99 mg/dL Final    Comment:    .            Fasting reference interval . For someone without known diabetes, a glucose value between 100 and 125 mg/dL is consistent with prediabetes and should be confirmed with a follow-up test. .   03/05/2023 103 (H) 65 - 99 mg/dL Final    Comment:    .            Fasting reference interval . For someone without known diabetes, a glucose value between 100 and 125 mg/dL is consistent with prediabetes and should be confirmed with a follow-up  test. .   10/10/2022 83 65 - 99 mg/dL Final    Comment:    .            Fasting reference interval .     Patient Active Problem List   Diagnosis Date Noted   BPH with obstruction/lower urinary tract symptoms 06/14/2023   Atherosclerosis of aorta (HCC) 03/05/2023   DOE (dyspnea on exertion) 10/10/2022   Pulmonary nodule 10/05/2022   History of heart artery stent 02/07/2022   History of pneumonia 02/07/2022   Moderate persistent asthma without complication 02/07/2022   History of shingles 02/07/2022   Fever blister 02/07/2022   Prediabetes 09/20/2018   Hypertension 04/12/2015   GERD (gastroesophageal reflux disease) 03/22/2015   Anxiety 03/22/2015   Pure hypercholesterolemia 12/10/2012   Family history of ischemic heart disease (IHD) 10/24/2012   History of fracture of rib 09/04/2011   Coronary artery disease involving native coronary artery of native heart without angina pectoris 08/01/2007    Past Surgical History:  Procedure Laterality Date   ANKLE FRACTURE SURGERY     BIOPSY  08/10/2023   Procedure: BIOPSY;  Surgeon: Wyline Mood, MD;  Location: [facility] ENDOSCOPY;  Service: Gastroenterology;;   CARDIAC CATHETERIZATION  10/02/2009   CARDIAC CATHETERIZATION  10/02/2006   COLONOSCOPY WITH PROPOFOL N/A 08/10/2023   Procedure: COLONOSCOPY WITH PROPOFOL;  Surgeon: Wyline Mood, MD;  Location: [facility] ENDOSCOPY;  Service: Gastroenterology;  Laterality: N/A;   CORONARY STENT PLACEMENT  2008 & 2011   ESOPHAGOGASTRODUODENOSCOPY (EGD) WITH PROPOFOL N/A 08/10/2023   Procedure: ESOPHAGOGASTRODUODENOSCOPY (EGD) WITH PROPOFOL;  Surgeon: Wyline Mood, MD;  Location: [facility] ENDOSCOPY;  Service: Gastroenterology;  Laterality: N/A;   FRACTURE SURGERY      Family History  Problem Relation Age of Onset   Parkinson's disease Mother    Heart disease Mother    Cancer Father     Social History   Socioeconomic History   Marital status: Married    Spouse name: Darel Hong   Number of children: 2    Years of education: 12   Highest education level: Some college, no degree  Occupational History   Occupation: self employed  Tobacco Use   Smoking status: Never   Smokeless tobacco: Never  Vaping Use   Vaping status: Never Used  Substance and Sexual Activity   Alcohol use: Not Currently    Comment: occasional wine   Drug use: No   Sexual activity: Yes    Partners: Female    Birth control/protection: None  Other Topics Concern   Not on file  Social History Narrative  Used to own an Scientist, 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 , 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    Chemical engineer Strain: Low Risk  (03/05/2023)   Overall Financial Resource Strain (CARDIA)    Difficulty of Paying Living Expenses: Not hard at all  Food Insecurity: No Food Insecurity (03/05/2023)   Hunger Vital Sign    Worried About Running Out of Food in the Last Year: Never true    Ran Out of Food in the Last Year: Never true  Transportation Needs: No Transportation Needs (03/05/2023)   PRAPARE - Administrator, Civil Service (Medical): No    Lack of Transportation (Non-Medical): No  Physical Activity: Sufficiently Active (03/05/2023)   Exercise Vital Sign    Days of Exercise per Week: 5 days    Minutes of Exercise per Session: 150+ min  Stress: No Stress Concern Present (03/05/2023)   [employer] of Occupational Health - Occupational Stress Questionnaire    Feeling of Stress : Not at all  Social Connections: Socially Integrated (03/05/2023)   Social Connection and Isolation Panel [NHANES]    Frequency of Communication with Friends and Family: More than three times a week    Frequency of Social Gatherings with Friends and Family: Once a week    Attends Religious Services: More than 4 times per year    Active Member of [employer] or Organizations: Yes    Attends Banker Meetings: 1 to 4 times per year    Marital Status: Married  Careers information officer Violence: Not At Risk (06/14/2023)   Humiliation, Afraid, Rape, and Kick questionnaire    Fear of Current or Ex-Partner: No    Emotionally Abused: No    Physically Abused: No    Sexually Abused: No     Current Outpatient Medications:    albuterol (VENTOLIN HFA) 108 (90 Base) MCG/ACT inhaler, TAKE 2 PUFFS BY MOUTH EVERY 6 HOURS AS NEEDED FOR WHEEZE OR SHORTNESS OF BREATH, Disp: 8.5 each, Rfl: 1   clopidogrel (PLAVIX) 75 MG tablet, Take 1 tablet (75 mg total) by mouth daily., Disp: 90 tablet, Rfl: 1   escitalopram (LEXAPRO) 10 MG tablet, TAKE 1 TABLET BY MOUTH EVERY DAY, Disp: 30 tablet, Rfl: 0   ezetimibe (ZETIA) 10 MG tablet, TAKE 1 TABLET BY MOUTH EVERY DAY, Disp: 30 tablet, Rfl: 0   fenofibrate (TRICOR) 145 MG tablet, TAKE 1 TABLET BY MOUTH EVERY DAY, Disp: 30 tablet, Rfl: 0   fluticasone furoate-vilanterol (BREO ELLIPTA) 100-25 MCG/ACT AEPB, TAKE 1 PUFF BY MOUTH EVERY DAY, Disp: 180 each, Rfl: 0   ipratropium-albuterol (DUONEB) 0.5-2.5 (3) MG/3ML SOLN, Take 3 mLs by nebulization every 6 (six) hours as needed (coughing fits, bronchitis following CAP with hx of asthma)., Disp: 360 mL, Rfl: 0   nebivolol (BYSTOLIC) 5 MG tablet, TAKE 1 TABLET (5 MG) BY MOUTH DAILY. FUTURE REFILL REQUEST WILL NEED TO BE SENT TO NEW CARDIOLOGIST, Disp: 30 tablet, Rfl: 0   pantoprazole (PROTONIX) 40 MG tablet, TAKE 1 TABLET BY MOUTH EVERY DAY, Disp: 30 tablet, Rfl: 0   rosuvastatin (CRESTOR) 40 MG tablet, TAKE 1 TABLET BY MOUTH EVERY DAY, Disp: 30 tablet, Rfl: 0  Allergies  Allergen Reactions   Molds & Smuts Shortness Of Breath   Ambrosia Trifida (Tall Ragweed) Allergy Skin Test    Fish Oil Hives   Gramineae Pollens    Peanut Oil    Tetracyclines & Related Other (See  Comments)    Intolerance - stomach upset     ROS  ***  Objective  There were no vitals filed for this visit.  There is no height or weight on file to calculate BMI.  Physical Exam ***  Recent Results (from the past 2160  hour(s))  Lipid panel     Status: None   Collection Time: 06/14/23 11:44 AM  Result Value Ref Range   Cholesterol 128 <200 mg/dL   HDL 65 > OR = 40 mg/dL   Triglycerides 77 <308 mg/dL   LDL Cholesterol (Calc) 47 mg/dL (calc)    Comment: Reference range: <100 . Desirable range <100 mg/dL for primary prevention;   <70 mg/dL for patients with CHD or diabetic patients  with > or = 2 CHD risk factors. Marland Kitchen LDL-C is now calculated using the Martin-Hopkins  calculation, which is a validated novel method providing  better accuracy than the Friedewald equation in the  estimation of LDL-C.  Horald Pollen et al. Lenox Ahr. 6578;469(62): 2061-2068  (http://education.[facility].com/faq/FAQ164)    Total CHOL/HDL Ratio 2.0 <5.0 (calc)   Non-HDL Cholesterol (Calc) 63 <952 mg/dL (calc)    Comment: For patients with diabetes plus 1 major ASCVD risk  factor, treating to a non-HDL-C goal of <100 mg/dL  (LDL-C of <84 mg/dL) is considered a therapeutic  option.   CBC with Differential/Platelet     Status: Abnormal   Collection Time: 06/14/23 11:44 AM  Result Value Ref Range   WBC 4.5 3.8 - 10.8 Thousand/uL   RBC 4.65 4.20 - 5.80 Million/uL   Hemoglobin 14.3 13.2 - 17.1 g/dL   HCT 13.2 44.0 - 10.2 %   MCV 92.0 80.0 - 100.0 fL   MCH 30.8 27.0 - 33.0 pg   MCHC 33.4 32.0 - 36.0 g/dL   RDW 72.5 36.6 - 44.0 %   Platelets 290 140 - 400 Thousand/uL   MPV 9.7 7.5 - 12.5 fL   Neutro Abs 2,930 1,500 - 7,800 cells/uL   Lymphs Abs 828 (L) 850 - 3,900 cells/uL   Absolute Monocytes 540 200 - 950 cells/uL   Eosinophils Absolute 162 15 - 500 cells/uL   Basophils Absolute 41 0 - 200 cells/uL   Neutrophils Relative % 65.1 %   Total Lymphocyte 18.4 %   Monocytes Relative 12.0 %   Eosinophils Relative 3.6 %   Basophils Relative 0.9 %  COMPLETE METABOLIC PANEL WITH GFR     Status: Abnormal   Collection Time: 06/14/23 11:44 AM  Result Value Ref Range   Glucose, Bld 100 (H) 65 - 99 mg/dL    Comment: .             Fasting reference interval . For someone without known diabetes, a glucose value between 100 and 125 mg/dL is consistent with prediabetes and should be confirmed with a follow-up test. .    BUN 21 7 - 25 mg/dL   Creat 3.47 4.25 - 9.56 mg/dL   eGFR 72 > OR = 60 LO/VFI/4.33I9   BUN/Creatinine Ratio SEE NOTE: 6 - 22 (calc)    Comment:    Not Reported: BUN and Creatinine are within    reference range. .    Sodium 140 135 - 146 mmol/L   Potassium 4.2 3.5 - 5.3 mmol/L   Chloride 104 98 - 110 mmol/L   CO2 28 20 - 32 mmol/L   Calcium 9.5 8.6 - 10.3 mg/dL   Total Protein 7.1 6.1 - 8.1 g/dL   Albumin 4.6 3.6 -  5.1 g/dL   Globulin 2.5 1.9 - 3.7 g/dL (calc)   AG Ratio 1.8 1.0 - 2.5 (calc)   Total Bilirubin 0.4 0.2 - 1.2 mg/dL   Alkaline phosphatase (APISO) 55 35 - 144 U/L   AST 30 10 - 35 U/L   ALT 25 9 - 46 U/L  Hemoglobin A1c     Status: Abnormal   Collection Time: 06/14/23 11:44 AM  Result Value Ref Range   Hgb A1c MFr Bld 6.2 (H) <5.7 % of total Hgb    Comment: For someone without known diabetes, a hemoglobin  A1c value between 5.7% and 6.4% is consistent with prediabetes and should be confirmed with a  follow-up test. . For someone with known diabetes, a value <7% indicates that their diabetes is well controlled. A1c targets should be individualized based on duration of diabetes, age, comorbid conditions, and other considerations. . This assay result is consistent with an increased risk of diabetes. . Currently, no consensus exists regarding use of hemoglobin A1c for diagnosis of diabetes for children. .    Mean Plasma Glucose 131 mg/dL   eAG (mmol/L) 7.3 mmol/L    Comment: . This test was performed on the Roche cobas c503 platform. Effective 07/10/22, a change in test platforms from the Abbott Architect to the Roche cobas c503 may have shifted HbA1c results compared to historical results. Based on laboratory validation testing conducted at [facility], the Roche platform  relative to the Abbott platform had an average increase in HbA1c value of < or = 0.3%. This difference is within accepted  variability established by the [facility]. Note that not all individuals will have had a shift in their results and direct comparisons between historical and current results for testing conducted on different platforms is not recommended.   PSA     Status: None   Collection Time: 06/14/23 11:44 AM  Result Value Ref Range   PSA 0.70 < OR = 4.00 ng/mL    Comment: The total PSA value from this assay system is  standardized against the WHO standard. The test  result will be approximately 20% lower when compared  to the equimolar-standardized total PSA (Beckman  Coulter). Comparison of serial PSA results should be  interpreted with this fact in mind. . This test was performed using the Siemens  chemiluminescent method. Values obtained from  different assay methods cannot be used interchangeably. PSA levels, regardless of value, should not be interpreted as absolute evidence of the presence or absence of disease.   Surgical pathology     Status: None   Collection Time: 08/10/23 12:00 AM  Result Value Ref Range   SURGICAL PATHOLOGY      SURGICAL PATHOLOGY [facility] [address] [place], [place] [address] Telephone [phone] or [phone] Fax [phone]  REPORT OF SURGICAL PATHOLOGY   Accession #: [id number] Patient Name: ARIEZ, NOLETTE Visit # : [id number]  MRN: [id number] Physician: Wyline Mood DOB/Age [birth year] (Age: 39) Gender: M Collected Date: 08/10/2023 Received Date: 08/10/2023  FINAL DIAGNOSIS       1. Stomach-Pylorus, cbx :       - ANTRAL MUCOSA WITH REACTIVE GASTRITIS.      - NEGATIVE FOR H. PYLORI, INTESTINAL METAPLASIA, DYSPLASIA, AND MALIGNANCY.       2. Ascending  Colon Polyp, x3 cold snare :       - TUBULAR ADENOMA (1).      - SESSILE SERRATED  POLYP (MULTIPLE FRAGMENTS).      -  NEGATIVE FOR HIGH-GRADE DYSPLASIA AND MALIGNANCY.       3. Descending Colon Polyp, cold snare :       - TUBULAR ADENOMA.      - NEGATIVE FOR HIGH-GRADE DYSPLASIA AND MALIGNANCY.       ELECTRONIC SIGNATURE : Rubinas Md, Delice Bison , Sports administrator, Electron Conservator, museum/gallery  MICROSCOPIC DESCRIPTION  CASE COMMENTS STAINS USED IN DIAGNOSIS: H&E H&E H&E    CLINICAL HISTORY  SPECIMEN(S) OBTAINED 1. Stomach-Pylorus, Cbx 2. Ascending  Colon Polyp, X3 Cold Snare 3. Descending Colon Polyp, Cold Snare  SPECIMEN COMMENTS: 1. R/O ulcer SPECIMEN CLINICAL INFORMATION: 1. GERD and screening for colon cancer, ulcer pylorus    Gross Description 1. "CBX pylorus R/O ulcer", received in formalin is a 0.5 x 0.4 x 0.1 cm aggregate of 3 white-pink tissue fragments.The specimen in toto in 1 block (1A). 2. "Cold snare ascending colon polyp x3", received in formalin is a 1.4 x 0.8 x 0.2 cm aggregate of multiple gray-tan tissue fragments.The specimen is filtered and submitted in toto in 1 block (2A). 3. "Cold snare descending colon polyp", received in formalin is a 0.7 x 0.2 x 0.1 submitted aggregate of 3 gray-white tissue fragments.The specimen is filtered and submitted in toto in 1 block (3A).      AMG 08/10/2023        Rep ort signed out from the following location(s) [facility] [address] [place], [place] [address] CLIA #: [id number]  [facility] [address] [place], [place] [address] CLIA #: [id number]      Fall Risk:    06/14/2023   10:31 AM 04/30/2023   10:16 AM 10/17/2022    2:33 PM 10/10/2022    1:15 PM 08/11/2022    9:39 AM  Fall Risk   Falls in the past year? 0 0 0 0 0  Number falls in past yr: 0 0 0 0 0  Injury with Fall? 0 0 0 0 0  Risk for fall due to : No Fall Risks  No Fall Risks No Fall Risks No Fall Risks  Follow up Falls prevention discussed;Education provided;Falls evaluation completed   Falls prevention discussed Falls prevention discussed;Education provided;Falls evaluation completed Falls prevention discussed;Education provided;Falls evaluation completed   ***  Functional Status Survey:   ***  Assessment & Plan  There are no diagnoses linked to this encounter.  -USPSTF grade A and B recommendations reviewed with patient; age-appropriate recommendations, preventive care, screening tests, etc discussed and encouraged; healthy living encouraged; see AVS for patient education given to patient -Discussed importance of 150 minutes of physical activity weekly, eat two servings of fish weekly, eat one serving of tree nuts ( cashews, pistachios, pecans, almonds.Marland Kitchen) every other day, eat 6 servings of fruit/vegetables daily and drink plenty of water and avoid sweet beverages.   -Reviewed Health Maintenance: {yes VH:[id number]}

## 2023-08-29 NOTE — Progress Notes (Unsigned)
Name: Edon Yelinek   MRN: [id number]    DOB: [birth year]   Date:08/29/2023       Progress Note  Subjective  Chief Complaint  No chief complaint on file.   HPI  *** Patient Active Problem List   Diagnosis Date Noted   BPH with obstruction/lower urinary tract symptoms 06/14/2023   Atherosclerosis of aorta (HCC) 03/05/2023   DOE (dyspnea on exertion) 10/10/2022   Pulmonary nodule 10/05/2022   History of heart artery stent 02/07/2022   History of pneumonia 02/07/2022   Moderate persistent asthma without complication 02/07/2022   History of shingles 02/07/2022   Fever blister 02/07/2022   Prediabetes 09/20/2018   Hypertension 04/12/2015   GERD (gastroesophageal reflux disease) 03/22/2015   Anxiety 03/22/2015   Pure hypercholesterolemia 12/10/2012   Family history of ischemic heart disease (IHD) 10/24/2012   History of fracture of rib 09/04/2011   Coronary artery disease involving native coronary artery of native heart without angina pectoris 08/01/2007    Past Surgical History:  Procedure Laterality Date   ANKLE FRACTURE SURGERY     BIOPSY  08/10/2023   Procedure: BIOPSY;  Surgeon: Wyline Mood, MD;  Location: [facility] ENDOSCOPY;  Service: Gastroenterology;;   CARDIAC CATHETERIZATION  10/02/2009   CARDIAC CATHETERIZATION  10/02/2006   COLONOSCOPY WITH PROPOFOL N/A 08/10/2023   Procedure: COLONOSCOPY WITH PROPOFOL;  Surgeon: Wyline Mood, MD;  Location: [facility] ENDOSCOPY;  Service: Gastroenterology;  Laterality: N/A;   CORONARY STENT PLACEMENT  2008 & 2011   ESOPHAGOGASTRODUODENOSCOPY (EGD) WITH PROPOFOL N/A 08/10/2023   Procedure: ESOPHAGOGASTRODUODENOSCOPY (EGD) WITH PROPOFOL;  Surgeon: Wyline Mood, MD;  Location: [facility] ENDOSCOPY;  Service: Gastroenterology;  Laterality: N/A;   FRACTURE SURGERY      Family History  Problem Relation Age of Onset   Parkinson's disease Mother    Heart disease Mother    Cancer Father     Social History   Tobacco Use   Smoking status: Never    Smokeless tobacco: Never  Substance Use Topics   Alcohol use: Not Currently    Comment: occasional wine     Current Outpatient Medications:    albuterol (VENTOLIN HFA) 108 (90 Base) MCG/ACT inhaler, TAKE 2 PUFFS BY MOUTH EVERY 6 HOURS AS NEEDED FOR WHEEZE OR SHORTNESS OF BREATH, Disp: 8.5 each, Rfl: 1   clopidogrel (PLAVIX) 75 MG tablet, Take 1 tablet (75 mg total) by mouth daily., Disp: 90 tablet, Rfl: 1   escitalopram (LEXAPRO) 10 MG tablet, TAKE 1 TABLET BY MOUTH EVERY DAY, Disp: 30 tablet, Rfl: 0   ezetimibe (ZETIA) 10 MG tablet, TAKE 1 TABLET BY MOUTH EVERY DAY, Disp: 30 tablet, Rfl: 0   fenofibrate (TRICOR) 145 MG tablet, TAKE 1 TABLET BY MOUTH EVERY DAY, Disp: 30 tablet, Rfl: 0   fluticasone furoate-vilanterol (BREO ELLIPTA) 100-25 MCG/ACT AEPB, TAKE 1 PUFF BY MOUTH EVERY DAY, Disp: 180 each, Rfl: 0   ipratropium-albuterol (DUONEB) 0.5-2.5 (3) MG/3ML SOLN, Take 3 mLs by nebulization every 6 (six) hours as needed (coughing fits, bronchitis following CAP with hx of asthma)., Disp: 360 mL, Rfl: 0   nebivolol (BYSTOLIC) 5 MG tablet, TAKE 1 TABLET (5 MG) BY MOUTH DAILY. FUTURE REFILL REQUEST WILL NEED TO BE SENT TO NEW CARDIOLOGIST, Disp: 30 tablet, Rfl: 0   pantoprazole (PROTONIX) 40 MG tablet, TAKE 1 TABLET BY MOUTH EVERY DAY, Disp: 30 tablet, Rfl: 0   rosuvastatin (CRESTOR) 40 MG tablet, TAKE 1 TABLET BY MOUTH EVERY DAY, Disp: 30 tablet, Rfl: 0  Allergies  Allergen Reactions  Molds & Smuts Shortness Of Breath   Ambrosia Trifida (Tall Ragweed) Allergy Skin Test    Fish Oil Hives   Gramineae Pollens    Peanut Oil    Tetracyclines & Related Other (See Comments)    Intolerance - stomach upset    I personally reviewed {Reviewed:14835} with the patient/caregiver today.   ROS  ***  Objective  There were no vitals filed for this visit.  There is no height or weight on file to calculate BMI.  Physical Exam ***  Recent Results (from the past 2160 hour(s))  Lipid panel      Status: None   Collection Time: 06/14/23 11:44 AM  Result Value Ref Range   Cholesterol 128 <200 mg/dL   HDL 65 > OR = 40 mg/dL   Triglycerides 77 <161 mg/dL   LDL Cholesterol (Calc) 47 mg/dL (calc)    Comment: Reference range: <100 . Desirable range <100 mg/dL for primary prevention;   <70 mg/dL for patients with CHD or diabetic patients  with > or = 2 CHD risk factors. Marland Kitchen LDL-C is now calculated using the Martin-Hopkins  calculation, which is a validated novel method providing  better accuracy than the Friedewald equation in the  estimation of LDL-C.  Horald Pollen et al. Lenox Ahr. 0960;454(09): 2061-2068  (http://education.[facility].com/faq/FAQ164)    Total CHOL/HDL Ratio 2.0 <5.0 (calc)   Non-HDL Cholesterol (Calc) 63 <811 mg/dL (calc)    Comment: For patients with diabetes plus 1 major ASCVD risk  factor, treating to a non-HDL-C goal of <100 mg/dL  (LDL-C of <91 mg/dL) is considered a therapeutic  option.   CBC with Differential/Platelet     Status: Abnormal   Collection Time: 06/14/23 11:44 AM  Result Value Ref Range   WBC 4.5 3.8 - 10.8 Thousand/uL   RBC 4.65 4.20 - 5.80 Million/uL   Hemoglobin 14.3 13.2 - 17.1 g/dL   HCT 47.8 29.5 - 62.1 %   MCV 92.0 80.0 - 100.0 fL   MCH 30.8 27.0 - 33.0 pg   MCHC 33.4 32.0 - 36.0 g/dL   RDW 30.8 65.7 - 84.6 %   Platelets 290 140 - 400 Thousand/uL   MPV 9.7 7.5 - 12.5 fL   Neutro Abs 2,930 1,500 - 7,800 cells/uL   Lymphs Abs 828 (L) 850 - 3,900 cells/uL   Absolute Monocytes 540 200 - 950 cells/uL   Eosinophils Absolute 162 15 - 500 cells/uL   Basophils Absolute 41 0 - 200 cells/uL   Neutrophils Relative % 65.1 %   Total Lymphocyte 18.4 %   Monocytes Relative 12.0 %   Eosinophils Relative 3.6 %   Basophils Relative 0.9 %  COMPLETE METABOLIC PANEL WITH GFR     Status: Abnormal   Collection Time: 06/14/23 11:44 AM  Result Value Ref Range   Glucose, Bld 100 (H) 65 - 99 mg/dL    Comment: .            Fasting reference  interval . For someone without known diabetes, a glucose value between 100 and 125 mg/dL is consistent with prediabetes and should be confirmed with a follow-up test. .    BUN 21 7 - 25 mg/dL   Creat 9.62 9.52 - 8.41 mg/dL   eGFR 72 > OR = 60 LK/GMW/1.02V2   BUN/Creatinine Ratio SEE NOTE: 6 - 22 (calc)    Comment:    Not Reported: BUN and Creatinine are within    reference range. .    Sodium 140 135 - 146  mmol/L   Potassium 4.2 3.5 - 5.3 mmol/L   Chloride 104 98 - 110 mmol/L   CO2 28 20 - 32 mmol/L   Calcium 9.5 8.6 - 10.3 mg/dL   Total Protein 7.1 6.1 - 8.1 g/dL   Albumin 4.6 3.6 - 5.1 g/dL   Globulin 2.5 1.9 - 3.7 g/dL (calc)   AG Ratio 1.8 1.0 - 2.5 (calc)   Total Bilirubin 0.4 0.2 - 1.2 mg/dL   Alkaline phosphatase (APISO) 55 35 - 144 U/L   AST 30 10 - 35 U/L   ALT 25 9 - 46 U/L  Hemoglobin A1c     Status: Abnormal   Collection Time: 06/14/23 11:44 AM  Result Value Ref Range   Hgb A1c MFr Bld 6.2 (H) <5.7 % of total Hgb    Comment: For someone without known diabetes, a hemoglobin  A1c value between 5.7% and 6.4% is consistent with prediabetes and should be confirmed with a  follow-up test. . For someone with known diabetes, a value <7% indicates that their diabetes is well controlled. A1c targets should be individualized based on duration of diabetes, age, comorbid conditions, and other considerations. . This assay result is consistent with an increased risk of diabetes. . Currently, no consensus exists regarding use of hemoglobin A1c for diagnosis of diabetes for children. .    Mean Plasma Glucose 131 mg/dL   eAG (mmol/L) 7.3 mmol/L    Comment: . This test was performed on the Roche cobas c503 platform. Effective 07/10/22, a change in test platforms from the Abbott Architect to the Roche cobas c503 may have shifted HbA1c results compared to historical results. Based on laboratory validation testing conducted at [facility], the Roche platform relative to the  Abbott platform had an average increase in HbA1c value of < or = 0.3%. This difference is within accepted  variability established by the [facility]. Note that not all individuals will have had a shift in their results and direct comparisons between historical and current results for testing conducted on different platforms is not recommended.   PSA     Status: None   Collection Time: 06/14/23 11:44 AM  Result Value Ref Range   PSA 0.70 < OR = 4.00 ng/mL    Comment: The total PSA value from this assay system is  standardized against the WHO standard. The test  result will be approximately 20% lower when compared  to the equimolar-standardized total PSA (Beckman  Coulter). Comparison of serial PSA results should be  interpreted with this fact in mind. . This test was performed using the Siemens  chemiluminescent method. Values obtained from  different assay methods cannot be used interchangeably. PSA levels, regardless of value, should not be interpreted as absolute evidence of the presence or absence of disease.   Surgical pathology     Status: None   Collection Time: 08/10/23 12:00 AM  Result Value Ref Range   SURGICAL PATHOLOGY      SURGICAL PATHOLOGY [facility] [address], [address] [place], [place] [address] Telephone [phone] or [phone] Fax [phone]  REPORT OF SURGICAL PATHOLOGY   Accession #: [id number] Patient Name: YGNACIO, RADEK Visit # : [id number]  MRN: [id number] Physician: Wyline Mood DOB/Age 02/20/1956 (Age: 74) Gender: M Collected Date: 08/10/2023 Received Date: 08/10/2023  FINAL DIAGNOSIS       1. Stomach-Pylorus, cbx :       - ANTRAL MUCOSA WITH REACTIVE GASTRITIS.      -  NEGATIVE FOR H. PYLORI, INTESTINAL METAPLASIA, DYSPLASIA, AND MALIGNANCY.       2. Ascending  Colon Polyp, x3 cold snare :       - TUBULAR ADENOMA (1).      - SESSILE SERRATED POLYP (MULTIPLE  FRAGMENTS).      - NEGATIVE FOR HIGH-GRADE DYSPLASIA AND MALIGNANCY.       3. Descending Colon Polyp, cold snare :       - TUBULAR ADENOMA.      - NEGATIVE FOR HIGH-GRADE DYSPLASIA AND MALIGNANCY.       ELECTRONIC SIGNATURE : Rubinas Md, Delice Bison , Sports administrator, Electron Conservator, museum/gallery  MICROSCOPIC DESCRIPTION  CASE COMMENTS STAINS USED IN DIAGNOSIS: H&E H&E H&E    CLINICAL HISTORY  SPECIMEN(S) OBTAINED 1. Stomach-Pylorus, Cbx 2. Ascending  Colon Polyp, X3 Cold Snare 3. Descending Colon Polyp, Cold Snare  SPECIMEN COMMENTS: 1. R/O ulcer SPECIMEN CLINICAL INFORMATION: 1. GERD and screening for colon cancer, ulcer pylorus    Gross Description 1. "CBX pylorus R/O ulcer", received in formalin is a 0.5 x 0.4 x 0.1 cm aggregate of 3 white-pink tissue fragments.The specimen in toto in 1 block (1A). 2. "Cold snare ascending colon polyp x3", received in formalin is a 1.4 x 0.8 x 0.2 cm aggregate of multiple gray-tan tissue fragments.The specimen is filtered and submitted in toto in 1 block (2A). 3. "Cold snare descending colon polyp", received in formalin is a 0.7 x 0.2 x 0.1 submitted aggregate of 3 gray-white tissue fragments.The specimen is filtered and submitted in toto in 1 block (3A).      AMG 08/10/2023        Rep ort signed out from the following location(s) [facility] [address], [place] [address] CLIA #: [id number]  [facility] [address] [place], [place] [address] CLIA #: [id number]     Diabetic Foot Exam: Diabetic Foot Exam - Simple   No data filed    ***  PHQ2/9:    06/14/2023   10:31 AM 04/30/2023   10:17 AM 03/05/2023    8:38 AM 10/17/2022    2:33 PM 10/10/2022    1:15 PM  Depression screen PHQ 2/9  Decreased Interest 0 0 0 0 0  Down, Depressed, Hopeless 0 0 0 0 0  PHQ - 2 Score 0 0 0 0 0  Altered sleeping 0  0 0 0  Tired, decreased energy 0  0 0 0  Change in appetite 0  0 0 0  Feeling  bad or failure about yourself  0  0 0 0  Trouble concentrating 0  0 0 0  Moving slowly or fidgety/restless 0  0 0 0  Suicidal thoughts 0  0 0 0  PHQ-9 Score 0  0 0 0  Difficult doing work/chores Not difficult at all    Not difficult at all    phq 9 is {gen pos [id number]} ***  Fall Risk:    06/14/2023   10:31 AM 04/30/2023   10:16 AM 10/17/2022    2:33 PM 10/10/2022    1:15 PM 08/11/2022    9:39 AM  Fall Risk   Falls in the past year? 0 0 0 0 0  Number falls in past yr: 0 0 0 0 0  Injury with Fall? 0 0 0 0 0  Risk for fall due to : No Fall Risks  No Fall Risks No Fall Risks No Fall Risks  Follow up Falls prevention discussed;Education  provided;Falls evaluation completed  Falls prevention discussed Falls prevention discussed;Education provided;Falls evaluation completed Falls prevention discussed;Education provided;Falls evaluation completed   ***   Functional Status Survey:   ***   Assessment & Plan  *** There are no diagnoses linked to this encounter.

## 2023-09-04 ENCOUNTER — Ambulatory Visit: Payer: Medicare Other | Admitting: Family Medicine

## 2023-09-10 ENCOUNTER — Other Ambulatory Visit: Payer: Self-pay | Admitting: Family Medicine

## 2023-09-10 DIAGNOSIS — K219 Gastro-esophageal reflux disease without esophagitis: Secondary | ICD-10-CM

## 2023-09-10 DIAGNOSIS — I10 Essential (primary) hypertension: Secondary | ICD-10-CM

## 2023-09-10 DIAGNOSIS — I2510 Atherosclerotic heart disease of native coronary artery without angina pectoris: Secondary | ICD-10-CM

## 2023-09-10 DIAGNOSIS — F419 Anxiety disorder, unspecified: Secondary | ICD-10-CM

## 2023-09-12 NOTE — Telephone Encounter (Signed)
Lvm on both numbers asking pt to schedule appt. Dr Carlynn Purl is willing to open up a 3:40 slot for him

## 2023-09-12 NOTE — Telephone Encounter (Signed)
Pt needs appt sowles said she had opening at 3:40 today?

## 2023-09-21 NOTE — Progress Notes (Unsigned)
Name: Paul Page   MRN: [id number]    DOB: [birth year]   Date:09/24/2023       Progress Note  Subjective  Chief Complaint  Chief Complaint  Patient presents with   Medical Management of Chronic Issues    HPI  HTN: taking medication, denies chest pain or palpitation. BP today is sat goal, he is back on Bystolic 5 mg. Continue current regiment    GERD: doing well on medication, no heart burn or indigestion. He is taking Pantoprazole and doing well    Asthma: he is taking Breo He has high allergy to pine ( cleaners/pine needles...) , it triggers his allergies and asthma. He still uses albuterol prn only. He had pneumonia in Dec 2023 . He takes Engineer, materials daily and is doing well. Personal history of CAP end of 2023 but doing well    Hyperlipidemia/Atherosclerosis of aorta /Aneurysm of ascending aorta : taking Crestor,  Zetia and Tricor.  He has CAD but denies chest pain or SOB at this time    CAD: s/p stent placement in 2008 and  last one in 2011 he was under the care of Dr Kirke Corin switched to Dr Jaymes Graff in [facility] but is coming back to Cardiologist - Dr. Myriam Forehand  . He is on statin,  Tricor,  Plavix, zetia  BP is at goal, no chest pain or palpitation  Aneurysm of Ascending Aorta and Atherosclerosis of Aorta ( CT report says ectasia of 4.2 cm ? Aneurysm ) we will place referral to vascular surgeon   Hyperglycemia:  He  denies polyphagia, polydipsia or polyuria. We will recheck it yearly    Anxiety: taking Lexapro, has to take it at night because it makes him feel tired, controlling symptoms. Stable on medications     History of CAP : April  2023, he had a severe episode of flu and CAP and admitted Dec 2023, he is still having some SOB with activity. He was found to have a right lung nodule inflammatory or lung cancer. He is under the care of Dr. Marcos Eke and last CT showed a small nodule that will be followed in one year   Patient Active Problem List   Diagnosis Date Noted   BPH with  obstruction/lower urinary tract symptoms 06/14/2023   Atherosclerosis of aorta (HCC) 03/05/2023   DOE (dyspnea on exertion) 10/10/2022   Pulmonary nodule 10/05/2022   History of heart artery stent 02/07/2022   History of pneumonia 02/07/2022   Moderate persistent asthma without complication 02/07/2022   History of shingles 02/07/2022   Fever blister 02/07/2022   Prediabetes 09/20/2018   Hypertension 04/12/2015   GERD (gastroesophageal reflux disease) 03/22/2015   Anxiety 03/22/2015   Pure hypercholesterolemia 12/10/2012   Family history of ischemic heart disease (IHD) 10/24/2012   History of fracture of rib 09/04/2011   Coronary artery disease involving native coronary artery of native heart without angina pectoris 08/01/2007    Past Surgical History:  Procedure Laterality Date   ANKLE FRACTURE SURGERY     BIOPSY  08/10/2023   Procedure: BIOPSY;  Surgeon: Wyline Mood, MD;  Location: [facility] ENDOSCOPY;  Service: Gastroenterology;;   CARDIAC CATHETERIZATION  10/02/2009   CARDIAC CATHETERIZATION  10/02/2006   COLONOSCOPY WITH PROPOFOL N/A 08/10/2023   Procedure: COLONOSCOPY WITH PROPOFOL;  Surgeon: Wyline Mood, MD;  Location: [facility] ENDOSCOPY;  Service: Gastroenterology;  Laterality: N/A;   CORONARY STENT PLACEMENT  2008 & 2011   ESOPHAGOGASTRODUODENOSCOPY (EGD) WITH PROPOFOL N/A 08/10/2023   Procedure: ESOPHAGOGASTRODUODENOSCOPY (EGD) WITH  PROPOFOL;  Surgeon: Wyline Mood, MD;  Location: [facility] ENDOSCOPY;  Service: Gastroenterology;  Laterality: N/A;   FRACTURE SURGERY      Family History  Problem Relation Age of Onset   Parkinson's disease Mother    Heart disease Mother    Cancer Father     Social History   Tobacco Use   Smoking status: Never   Smokeless tobacco: Never  Substance Use Topics   Alcohol use: Not Currently    Comment: occasional wine     Current Outpatient Medications:    albuterol (VENTOLIN HFA) 108 (90 Base) MCG/ACT inhaler, TAKE 2 PUFFS BY MOUTH EVERY 6 HOURS  AS NEEDED FOR WHEEZE OR SHORTNESS OF BREATH, Disp: 8.5 each, Rfl: 1   clopidogrel (PLAVIX) 75 MG tablet, Take 1 tablet (75 mg total) by mouth daily., Disp: 90 tablet, Rfl: 1   escitalopram (LEXAPRO) 10 MG tablet, TAKE 1 TABLET BY MOUTH EVERY DAY, Disp: 30 tablet, Rfl: 0   ezetimibe (ZETIA) 10 MG tablet, TAKE 1 TABLET BY MOUTH EVERY DAY, Disp: 30 tablet, Rfl: 0   fenofibrate (TRICOR) 145 MG tablet, TAKE 1 TABLET BY MOUTH EVERY DAY, Disp: 30 tablet, Rfl: 0   fluticasone furoate-vilanterol (BREO ELLIPTA) 100-25 MCG/ACT AEPB, TAKE 1 PUFF BY MOUTH EVERY DAY, Disp: 180 each, Rfl: 0   ipratropium-albuterol (DUONEB) 0.5-2.5 (3) MG/3ML SOLN, Take 3 mLs by nebulization every 6 (six) hours as needed (coughing fits, bronchitis following CAP with hx of asthma)., Disp: 360 mL, Rfl: 0   nebivolol (BYSTOLIC) 5 MG tablet, TAKE 1 TABLET (5 MG) BY MOUTH DAILY. FUTURE REFILL REQUEST WILL NEED TO BE SENT TO NEW CARDIOLOGIST, Disp: 30 tablet, Rfl: 0   pantoprazole (PROTONIX) 40 MG tablet, TAKE 1 TABLET BY MOUTH EVERY DAY, Disp: 30 tablet, Rfl: 0   rosuvastatin (CRESTOR) 40 MG tablet, TAKE 1 TABLET BY MOUTH EVERY DAY, Disp: 30 tablet, Rfl: 0  Allergies  Allergen Reactions   Molds & Smuts Shortness Of Breath   Ambrosia Trifida (Tall Ragweed) Allergy Skin Test    Fish Oil Hives   Gramineae Pollens    Peanut Oil    Tetracyclines & Related Other (See Comments)    Intolerance - stomach upset    I personally reviewed active problem list, medication list, allergies, family history with the patient/caregiver today.   ROS  Ten systems reviewed and is negative except as mentioned in HPI    Objective  Vitals:   09/24/23 1511  BP: 122/76  Pulse: 89  Resp: 16  SpO2: 95%  Weight: 207 lb 4.8 oz (94 kg)  Height: 5\' 10"  (1.778 m)    Body mass index is 29.74 kg/m.  Physical Exam  Constitutional: Patient appears well-developed and well-nourished.  No distress.  HEENT: head atraumatic, normocephalic, pupils  equal and reactive to light, neck supple Cardiovascular: Normal rate, regular rhythm and normal heart sounds.  No murmur heard. No BLE edema. Pulmonary/Chest: Effort normal and breath sounds normal. No respiratory distress. Abdominal: Soft.  There is no tenderness. Psychiatric: Patient has a normal mood and affect. behavior is normal. Judgment and thought content normal.   Recent Results (from the past 2160 hours)  Surgical pathology     Status: None   Collection Time: 08/10/23 12:00 AM  Result Value Ref Range   SURGICAL PATHOLOGY      SURGICAL PATHOLOGY [facility] [address] [place], [place] [address] Telephone [phone] or [phone] Fax [phone]  REPORT OF SURGICAL PATHOLOGY  Accession #: [id number] Patient Name: DEONTRAY, HURFORD Visit # : [id number]  MRN: [id number] Physician: Wyline Mood DOB/Age 05/09/1956 (Age: 88) Gender: M Collected Date: 08/10/2023 Received Date: 08/10/2023  FINAL DIAGNOSIS       1. Stomach-Pylorus, cbx :       - ANTRAL MUCOSA WITH REACTIVE GASTRITIS.      - NEGATIVE FOR H. PYLORI, INTESTINAL METAPLASIA, DYSPLASIA, AND MALIGNANCY.       2. Ascending  Colon Polyp, x3 cold snare :       - TUBULAR ADENOMA (1).      - SESSILE SERRATED POLYP (MULTIPLE FRAGMENTS).      - NEGATIVE FOR HIGH-GRADE DYSPLASIA AND MALIGNANCY.       3. Descending Colon Polyp, cold snare :       - TUBULAR ADENOMA.      - NEGATIVE FOR HIGH-GRADE DYSPLASIA AND MALIGNANCY.       ELECTRONIC SIGNATURE : Rubinas Md, Delice Bison , Sports administrator, Electron Conservator, museum/gallery  MICROSCOPIC DESCRIPTION  CASE COMMENTS STAINS USED IN DIAGNOSIS: H&E H&E H&E    CLINICAL HISTORY  SPECIMEN(S) OBTAINED 1. Stomach-Pylorus, Cbx 2. Ascending  Colon Polyp, X3 Cold Snare 3. Descending Colon Polyp, Cold Snare  SPECIMEN COMMENTS: 1. R/O ulcer SPECIMEN CLINICAL INFORMATION: 1. GERD and screening for colon cancer, ulcer pylorus    Gross  Description 1. "CBX pylorus R/O ulcer", received in formalin is a 0.5 x 0.4 x 0.1 cm aggregate of 3 white-pink tissue fragments.The specimen in toto in 1 block (1A). 2. "Cold snare ascending colon polyp x3", received in formalin is a 1.4 x 0.8 x 0.2 cm aggregate of multiple gray-tan tissue fragments.The specimen is filtered and submitted in toto in 1 block (2A). 3. "Cold snare descending colon polyp", received in formalin is a 0.7 x 0.2 x 0.1 submitted aggregate of 3 gray-white tissue fragments.The specimen is filtered and submitted in toto in 1 block (3A).      AMG 08/10/2023        Rep ort signed out from the following location(s) [place] [address], [place] [address] CLIA #: [id number]  [facility] [address] [place], [place] [address] CLIA #: [id number]     Diabetic Foot Exam:     PHQ2/9:    09/24/2023    3:11 PM 06/14/2023   10:31 AM 04/30/2023   10:17 AM 03/05/2023    8:38 AM 10/17/2022    2:33 PM  Depression screen PHQ 2/9  Decreased Interest 0 0 0 0 0  Down, Depressed, Hopeless 0 0 0 0 0  PHQ - 2 Score 0 0 0 0 0  Altered sleeping 0 0  0 0  Tired, decreased energy 0 0  0 0  Change in appetite 0 0  0 0  Feeling bad or failure about yourself  0 0  0 0  Trouble concentrating 0 0  0 0  Moving slowly or fidgety/restless 0 0  0 0  Suicidal thoughts 0 0  0 0  PHQ-9 Score 0 0  0 0  Difficult doing work/chores Not difficult at all Not difficult at all       phq 9 is negative   Fall Risk:    09/24/2023    3:07 PM 06/14/2023   10:31 AM 04/30/2023   10:16 AM 10/17/2022    2:33 PM 10/10/2022    1:15 PM  Fall Risk   Falls in the past year? 0 0 0 0 0  Number falls in past yr: 0 0 0 0 0  Injury with Fall? 0 0 0 0 0  Risk for fall due to : No Fall Risks No Fall Risks  No Fall Risks No Fall Risks  Follow up Falls prevention discussed;Education provided;Falls evaluation completed Falls prevention discussed;Education  provided;Falls evaluation completed  Falls prevention discussed Falls prevention discussed;Education provided;Falls evaluation completed     Assessment & Plan  1. Atherosclerosis of aorta (HCC) (Primary)  On statin and zetia plus plavix   2. Artery Ectasia ( HCC )  - Ambulatory referral to Vascular Surgery 4.2 cm in size - discussed aneurysm and needs to see sub-specialist   3. Coronary artery disease involving native coronary artery of native heart without angina pectoris  - clopidogrel (PLAVIX) 75 MG tablet; Take 1 tablet (75 mg total) by mouth daily.  Dispense: 90 tablet; Refill: 1 - ezetimibe (ZETIA) 10 MG tablet; Take 1 tablet (10 mg total) by mouth daily.  Dispense: 90 tablet; Refill: 1 - fenofibrate (TRICOR) 145 MG tablet; Take 1 tablet (145 mg total) by mouth daily.  Dispense: 90 tablet; Refill: 1 - nebivolol (BYSTOLIC) 5 MG tablet; Take 1 tablet (5 mg total) by mouth daily.  Dispense: 90 tablet; Refill: 1 - rosuvastatin (CRESTOR) 40 MG tablet; Take 1 tablet (40 mg total) by mouth daily.  Dispense: 90 tablet; Refill: 1  4. Anxiety  - escitalopram (LEXAPRO) 10 MG tablet; Take 1 tablet (10 mg total) by mouth daily.  Dispense: 90 tablet; Refill: 1  5. Moderate persistent asthma without complication  - fluticasone furoate-vilanterol (BREO ELLIPTA) 100-25 MCG/ACT AEPB; Inhale 1 puff into the lungs daily.  Dispense: 180 each; Refill: 1  6. Primary hypertension  - nebivolol (BYSTOLIC) 5 MG tablet; Take 1 tablet (5 mg total) by mouth daily.  Dispense: 90 tablet; Refill: 1  7. Gastroesophageal reflux disease without esophagitis  - pantoprazole (PROTONIX) 40 MG tablet; Take 1 tablet (40 mg total) by mouth daily.  Dispense: 90 tablet; Refill: 1  8. Prediabetes

## 2023-09-24 ENCOUNTER — Ambulatory Visit (INDEPENDENT_AMBULATORY_CARE_PROVIDER_SITE_OTHER): Payer: Medicare Other | Admitting: Family Medicine

## 2023-09-24 ENCOUNTER — Encounter: Payer: Self-pay | Admitting: Family Medicine

## 2023-09-24 VITALS — BP 122/76 | HR 89 | Resp 16 | Ht 70.0 in | Wt 207.3 lb

## 2023-09-24 DIAGNOSIS — R7303 Prediabetes: Secondary | ICD-10-CM | POA: Diagnosis not present

## 2023-09-24 DIAGNOSIS — I2510 Atherosclerotic heart disease of native coronary artery without angina pectoris: Secondary | ICD-10-CM | POA: Diagnosis not present

## 2023-09-24 DIAGNOSIS — I10 Essential (primary) hypertension: Secondary | ICD-10-CM | POA: Diagnosis not present

## 2023-09-24 DIAGNOSIS — F419 Anxiety disorder, unspecified: Secondary | ICD-10-CM | POA: Diagnosis not present

## 2023-09-24 DIAGNOSIS — I7789 Other specified disorders of arteries and arterioles: Secondary | ICD-10-CM

## 2023-09-24 DIAGNOSIS — I700 Atherosclerosis of aorta: Secondary | ICD-10-CM

## 2023-09-24 DIAGNOSIS — J4540 Moderate persistent asthma, uncomplicated: Secondary | ICD-10-CM

## 2023-09-24 DIAGNOSIS — K219 Gastro-esophageal reflux disease without esophagitis: Secondary | ICD-10-CM | POA: Diagnosis not present

## 2023-09-24 MED ORDER — ROSUVASTATIN CALCIUM 40 MG PO TABS: 40.0000 mg | ORAL_TABLET | Freq: Every day | ORAL | 1 refills | Status: DC

## 2023-09-24 MED ORDER — NEBIVOLOL HCL 5 MG PO TABS: 5.0000 mg | ORAL_TABLET | Freq: Every day | ORAL | 1 refills | Status: DC

## 2023-09-24 MED ORDER — CLOPIDOGREL BISULFATE 75 MG PO TABS: 75.0000 mg | ORAL_TABLET | Freq: Every day | ORAL | 1 refills | Status: DC

## 2023-09-24 MED ORDER — FENOFIBRATE 145 MG PO TABS: 145.0000 mg | ORAL_TABLET | Freq: Every day | ORAL | 1 refills | Status: DC

## 2023-09-24 MED ORDER — FLUTICASONE FUROATE-VILANTEROL 100-25 MCG/ACT IN AEPB: 1.0000 | INHALATION_SPRAY | Freq: Every day | RESPIRATORY_TRACT | 1 refills | Status: DC

## 2023-09-24 MED ORDER — ESCITALOPRAM OXALATE 10 MG PO TABS: 10.0000 mg | ORAL_TABLET | Freq: Every day | ORAL | 1 refills | Status: DC

## 2023-09-24 MED ORDER — PANTOPRAZOLE SODIUM 40 MG PO TBEC: 40.0000 mg | DELAYED_RELEASE_TABLET | Freq: Every day | ORAL | 1 refills | Status: DC

## 2023-09-24 MED ORDER — EZETIMIBE 10 MG PO TABS: 10.0000 mg | ORAL_TABLET | Freq: Every day | ORAL | 1 refills | Status: DC

## 2023-10-02 ENCOUNTER — Ambulatory Visit: Payer: Self-pay | Admitting: *Deleted

## 2023-10-02 NOTE — Telephone Encounter (Signed)
 Message from Bostic C sent at 10/02/2023 12:05 PM EST  Summary: Need appt with Cardio vascular surgeon   Pt states they received a call from a Vascular office, appt on Jan 27th with him but he needs a cardio vascular surgeon not this dr. Please advise as pt needs this taken care of asap.          Call History  Contact Date/Time Type Contact Phone/Fax By  10/02/2023 12:01 PM EST Phone (Incoming) Lincks,Judy (Emergency Contact) [phone] Paul) Marita Nathanel Page   Reason for Disposition  Health Information question, no triage required and triager able to answer question  Answer Assessment - Initial Assessment Questions 1. REASON FOR CALL or QUESTION: What is your reason for calling today? or How can I best help you? or What question do you have that I can help answer?     I returned call to wife Paul Page.   They got a call from a vascular surgeon's office and she wanted to be sure that was the right kind of doctor he needed.   I thought it was a cardiac vascular doctor or something like that but I just wanted to be sure.     Checking in his chart he has been referred to a vascular surgeon for the aortic aneurysm, Dr. Cordella Shawl for 10/29/2023.   I let her know according to Dr. Glenard' note that is the correct doctor, a vascular surgeon.   She thanked me very much for checking on that.    No other questions.  Protocols used: Information Only Call - No Triage-A-AH

## 2023-10-02 NOTE — Telephone Encounter (Signed)
  Chief Complaint: Referenced his chart and answered Mrs. Feister's question. Symptoms: N/A Frequency: N/A Pertinent Negatives: Patient denies N/A Disposition: [] ED /[] Urgent Care (no appt availability in office) / [] Appointment(In office/virtual)/ []  [facility]/ Home Care/ [] Refused Recommended Disposition /[] [facility]/ []  Follow-up with PCP Additional Notes:

## 2023-10-19 ENCOUNTER — Other Ambulatory Visit: Payer: Self-pay | Admitting: Family Medicine

## 2023-10-19 DIAGNOSIS — I2510 Atherosclerotic heart disease of native coronary artery without angina pectoris: Secondary | ICD-10-CM

## 2023-10-21 ENCOUNTER — Other Ambulatory Visit: Payer: Self-pay | Admitting: Family Medicine

## 2023-10-21 DIAGNOSIS — I2510 Atherosclerotic heart disease of native coronary artery without angina pectoris: Secondary | ICD-10-CM

## 2023-10-21 DIAGNOSIS — I10 Essential (primary) hypertension: Secondary | ICD-10-CM

## 2023-10-21 DIAGNOSIS — F419 Anxiety disorder, unspecified: Secondary | ICD-10-CM

## 2023-10-21 DIAGNOSIS — K219 Gastro-esophageal reflux disease without esophagitis: Secondary | ICD-10-CM

## 2023-10-22 ENCOUNTER — Other Ambulatory Visit: Payer: Self-pay | Admitting: Family Medicine

## 2023-10-22 DIAGNOSIS — K219 Gastro-esophageal reflux disease without esophagitis: Secondary | ICD-10-CM

## 2023-10-23 ENCOUNTER — Encounter: Payer: Self-pay | Admitting: Podiatry

## 2023-10-23 ENCOUNTER — Ambulatory Visit: Payer: Medicare Other | Admitting: Podiatry

## 2023-10-23 VITALS — Ht 70.0 in | Wt 207.3 lb

## 2023-10-23 DIAGNOSIS — M65971 Unspecified synovitis and tenosynovitis, right ankle and foot: Secondary | ICD-10-CM | POA: Diagnosis not present

## 2023-10-23 DIAGNOSIS — M65171 Other infective (teno)synovitis, right ankle and foot: Secondary | ICD-10-CM | POA: Diagnosis not present

## 2023-10-23 DIAGNOSIS — M19071 Primary osteoarthritis, right ankle and foot: Secondary | ICD-10-CM

## 2023-10-23 NOTE — Progress Notes (Signed)
Subjective:  Patient ID: Paul Page, male    DOB: 28-Jun-1956,  MRN: [id number]  Chief Complaint  Patient presents with   Toe Pain    Pt is here due to right great toe pain states he is on his feet a lot due to work, received an injection to the joint of his right great toe December of 2023 and didn't have any pain until around November 2024, hoping can receive another injection due to the pain.    68 y.o. male presents with the above complaint.  Patient presents for follow-up of right first metatarsophalangeal joint pain he states he is doing okay the injection helped considerably lasting up to a year.  He would like to do another 1   Review of Systems: Negative except as noted in the HPI. Denies N/V/F/Ch.  Past Medical History:  Diagnosis Date   Allergy    Anxiety    Arthritis    Asthma    Coronary artery disease    Three-vessel coronary artery disease diagnosed in October 2008 with normal ejection fraction. PCI and drug-eluting stent placement to the LAD(3.0 x 8 mm Promus), left circumflex(2.5 x 12 mm Promus) and right coronary artery(3.5 x 12 mm Promus). Unstable angina in 2011. PCI of RCA with a 3.5 x 15 mm Promus and PCI of LAD with a 2.5 x 28 mm Promus drug-eluting stents    GERD (gastroesophageal reflux disease)    Heart disease    Hyperlipidemia    Hypertension    Traumatic pneumothorax 09/04/2011   Overview:   08/22/11  S/P fall, right.    Current Outpatient Medications:    albuterol (VENTOLIN HFA) 108 (90 Base) MCG/ACT inhaler, TAKE 2 PUFFS BY MOUTH EVERY 6 HOURS AS NEEDED FOR WHEEZE OR SHORTNESS OF BREATH, Disp: 8.5 each, Rfl: 1   clopidogrel (PLAVIX) 75 MG tablet, TAKE 1 TABLET BY MOUTH EVERY DAY, Disp: 90 tablet, Rfl: 1   escitalopram (LEXAPRO) 10 MG tablet, TAKE 1 TABLET BY MOUTH EVERY DAY, Disp: 90 tablet, Rfl: 1   ezetimibe (ZETIA) 10 MG tablet, TAKE 1 TABLET BY MOUTH EVERY DAY, Disp: 90 tablet, Rfl: 1   fenofibrate (TRICOR) 145 MG tablet, Take 1 tablet (145  mg total) by mouth daily., Disp: 90 tablet, Rfl: 1   fluticasone furoate-vilanterol (BREO ELLIPTA) 100-25 MCG/ACT AEPB, Inhale 1 puff into the lungs daily., Disp: 180 each, Rfl: 1   nebivolol (BYSTOLIC) 5 MG tablet, Take 1 tablet (5 mg total) by mouth daily., Disp: 90 tablet, Rfl: 0   pantoprazole (PROTONIX) 40 MG tablet, TAKE 1 TABLET BY MOUTH EVERY DAY, Disp: 90 tablet, Rfl: 1   rosuvastatin (CRESTOR) 40 MG tablet, TAKE 1 TABLET BY MOUTH EVERY DAY, Disp: 90 tablet, Rfl: 1  Social History   Tobacco Use  Smoking Status Never  Smokeless Tobacco Never    Allergies  Allergen Reactions   Molds & Smuts Shortness Of Breath   Ambrosia Trifida (Tall Ragweed) Allergy Skin Test    Fish Oil Hives   Gramineae Pollens    Peanut Oil    Tetracyclines & Related Other (See Comments)    Intolerance - stomach upset   Objective:  There were no vitals filed for this visit. Body mass index is 29.74 kg/m. Constitutional Well developed. Well nourished.  Vascular Dorsalis pedis pulses palpable bilaterally. Posterior tibial pulses palpable bilaterally. Capillary refill normal to all digits.  No cyanosis or clubbing noted. Pedal hair growth normal.  Neurologic Normal speech. Oriented to person, place, and time.  Epicritic sensation to light touch grossly present bilaterally.  Dermatologic Nails well groomed and normal in appearance. No open wounds. No skin lesions.  Orthopedic: Pain on palpation right first metatarsophalangeal joint.  Pain with range of motion of the first MPJ joint.  Deep intra-articular first MPJ pain noted limited range of motion noted at the first MPJ joint.  Severe arthritis clinically appreciated crepitus clinically appreciated   Radiographs: None Assessment:   No diagnosis found.  Plan:  Patient was evaluated and treated and all questions answered.  Right first MPJ severe arthritis -All questions and concerns were discussed with the patient in extensive detail given the  amount of pain that he is experiencing benefit from steroid injection of decrease acute inflammatory component associate with pain.  Patient agrees with plan like to proceed with steroid injection -Another steroid injection was performed at right first MTP using 1% plain Lidocaine and 10 mg of Kenalog. This was well tolerated. -Shoe gear modification was discussed. -I discussed with him that if the shots no longer work we will discuss MPJ fusion versus arthroplasty of the joint he states understanding   No follow-ups on file.

## 2023-10-24 ENCOUNTER — Ambulatory Visit: Payer: Medicare Other | Attending: Cardiology | Admitting: Cardiology

## 2023-10-24 ENCOUNTER — Encounter: Payer: Self-pay | Admitting: Cardiology

## 2023-10-24 VITALS — BP 140/72 | Ht 70.0 in | Wt 204.0 lb

## 2023-10-24 DIAGNOSIS — E782 Mixed hyperlipidemia: Secondary | ICD-10-CM

## 2023-10-24 DIAGNOSIS — I77810 Thoracic aortic ectasia: Secondary | ICD-10-CM

## 2023-10-24 DIAGNOSIS — I2510 Atherosclerotic heart disease of native coronary artery without angina pectoris: Secondary | ICD-10-CM

## 2023-10-24 DIAGNOSIS — I10 Essential (primary) hypertension: Secondary | ICD-10-CM

## 2023-10-24 NOTE — Patient Instructions (Signed)
Medication Instructions:   Your physician recommends that you continue on your current medications as directed. Please refer to the Current Medication list given to you today.  *If you need a refill on your cardiac medications before your next appointment, please call your pharmacy*   Lab Work:  None Ordered  If you have labs (blood work) drawn today and your tests are completely normal, you will receive your results only by: MyChart Message (if you have MyChart) OR A paper copy in the mail If you have any lab test that is abnormal or we need to change your treatment, we will call you to review the results.   Testing/Procedures:  Your physician has requested that you have an echocardiogram. Echocardiography is a painless test that uses sound waves to create images of your heart. It provides your doctor with information about the size and shape of your heart and how well your heart's chambers and valves are working. This procedure takes approximately one hour. There are no restrictions for this procedure. Please do NOT wear cologne, perfume, aftershave, or lotions (deodorant is allowed). Please arrive 15 minutes prior to your appointment time.  Please note: We ask at that you not bring children with you during ultrasound (echo/ vascular) testing. Due to room size and safety concerns, children are not allowed in the ultrasound rooms during exams. Our front office staff cannot provide observation of children in our lobby area while testing is being conducted. An adult accompanying a patient to their appointment will only be allowed in the ultrasound room at the discretion of the ultrasound technician under special circumstances. We apologize for any inconvenience.    Follow-Up: At [facility], you and your health needs are our priority.  As part of our continuing mission to provide you with exceptional heart care, we have created designated Provider Care Teams.  These Care Teams  include your primary Cardiologist (physician) and Advanced Practice Providers (APPs -  Physician Assistants and Nurse Practitioners) who all work together to provide you with the care you need, when you need it.  We recommend signing up for the patient portal called "MyChart".  Sign up information is provided on this After Visit Summary.  MyChart is used to connect with patients for Virtual Visits (Telemedicine).  Patients are able to view lab/test results, encounter notes, upcoming appointments, etc.  Non-urgent messages can be sent to your provider as well.   To learn more about what you can do with MyChart, go to [email or web address].    Your next appointment:   4 month(s)  Provider:   You may see Lorine Bears, MD or one of the following Advanced Practice Providers on your designated Care Team:   Nicolasa Ducking, NP Eula Listen, PA-C Cadence Fransico Michael, PA-C Charlsie Quest, NP Carlos Levering, NP

## 2023-10-24 NOTE — Progress Notes (Signed)
Cardiology Office Note:    Date:  10/24/2023   ID:  Paul Page, DOB 05/06/1956, MRN [id number]  PCP:  Alba Cory, MD   [facility] Providers Cardiologist:  Debbe Odea, MD     Referring MD: Alba Cory, MD   Chief Complaint  Patient presents with   New Patient (Initial Visit)    Patient use to see Dr. Kirke Corin in the past - No new complaints. Scheduled to see cardiac surgery next week. Meds reviewed verbally with patient.     History of Present Illness:    Paul Page is a 68 y.o. male with a hx of CAD/PCI x 5 (DES to LAD, RCA 2011, DES to LAD, LCx, RCA in 2008), hyperlipidemia who presents to establish care.  Recently followed up with [facility] cardiology.  Denies chest pain or shortness of breath.  Previously seen at our practice last in 2011.  Has a history of drug-eluting stents x 5 to coronary arteries.  Denies chest pain or shortness of breath.  Compliant with medications as prescribed.  Had recent infection/pulmonary dysfunction requiring admission.  Follow-up chest CT upon discharge/2024 showed mild ascending aorta dilatation 4.2 cm.  Patient was referred by PCP to CT surgery.  Otherwise doing okay.  Blood pressure is usually controlled on current medications.  Also take bisoprolol due to history of palpitations.  Palpitations have been adequately controlled.  Outside echo at [facility] 03/2020 EF 55 to 60% NM SPECT 03/2020 no evidence for significant ischemia.  Past Medical History:  Diagnosis Date   Allergy    Anxiety    Arthritis    Asthma    Coronary artery disease    Three-vessel coronary artery disease diagnosed in October 2008 with normal ejection fraction. PCI and drug-eluting stent placement to the LAD(3.0 x 8 mm Promus), left circumflex(2.5 x 12 mm Promus) and right coronary artery(3.5 x 12 mm Promus). Unstable angina in 2011. PCI of RCA with a 3.5 x 15 mm Promus and PCI of LAD with a 2.5 x 28 mm Promus drug-eluting stents    GERD  (gastroesophageal reflux disease)    Heart disease    Hyperlipidemia    Hypertension    Traumatic pneumothorax 09/04/2011   Overview:   08/22/11  S/P fall, right.    Past Surgical History:  Procedure Laterality Date   ANKLE FRACTURE SURGERY     BIOPSY  08/10/2023   Procedure: BIOPSY;  Surgeon: Wyline Mood, MD;  Location: [facility] ENDOSCOPY;  Service: Gastroenterology;;   CARDIAC CATHETERIZATION  10/02/2009   CARDIAC CATHETERIZATION  10/02/2006   COLONOSCOPY WITH PROPOFOL N/A 08/10/2023   Procedure: COLONOSCOPY WITH PROPOFOL;  Surgeon: Wyline Mood, MD;  Location[facility] ENDOSCOPY;  Service: Gastroenterology;  Laterality: N/A;   CORONARY STENT PLACEMENT  2008 & 2011   ESOPHAGOGASTRODUODENOSCOPY (EGD) WITH PROPOFOL N/A 08/10/2023   Procedure: ESOPHAGOGASTRODUODENOSCOPY (EGD) WITH PROPOFOL;  Surgeon: Wyline Mood, MD;  Location: [facility] ENDOSCOPY;  Service: Gastroenterology;  Laterality: N/A;   FRACTURE SURGERY      Current Medications: Current Meds  Medication Sig   albuterol (VENTOLIN HFA) 108 (90 Base) MCG/ACT inhaler TAKE 2 PUFFS BY MOUTH EVERY 6 HOURS AS NEEDED FOR WHEEZE OR SHORTNESS OF BREATH   clopidogrel (PLAVIX) 75 MG tablet TAKE 1 TABLET BY MOUTH EVERY DAY   escitalopram (LEXAPRO) 10 MG tablet TAKE 1 TABLET BY MOUTH EVERY DAY   ezetimibe (ZETIA) 10 MG tablet TAKE 1 TABLET BY MOUTH EVERY DAY   fenofibrate (TRICOR) 145 MG tablet Take 1 tablet (145  mg total) by mouth daily.   fluticasone furoate-vilanterol (BREO ELLIPTA) 100-25 MCG/ACT AEPB Inhale 1 puff into the lungs daily.   nebivolol (BYSTOLIC) 5 MG tablet Take 1 tablet (5 mg total) by mouth daily.   pantoprazole (PROTONIX) 40 MG tablet TAKE 1 TABLET BY MOUTH EVERY DAY   rosuvastatin (CRESTOR) 40 MG tablet TAKE 1 TABLET BY MOUTH EVERY DAY     Allergies:   Molds & smuts, Ambrosia trifida (tall ragweed) allergy skin test, Fish oil, Gramineae pollens, Peanut oil, and Tetracyclines & related   Social History   Socioeconomic History    Marital status: Married    Spouse name: Darel Hong   Number of children: 2   Years of education: 12   Highest education level: Some college, no degree  Occupational History   Occupation: self employed  Tobacco Use   Smoking status: Never   Smokeless tobacco: Never  Vaping Use   Vaping status: Never Used  Substance and Sexual Activity   Alcohol use: Not Currently    Comment: occasional wine   Drug use: No   Sexual activity: Yes    Partners: Female    Birth control/protection: None  Other Topics Concern   Not on file  Social History Narrative   Used to own an Scientist, 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 , 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    Social Drivers of Corporate investment banker Strain: Low Risk  (03/05/2023)   Overall Financial Resource Strain (CARDIA)    Difficulty of Paying Living Expenses: Not hard at all  Food Insecurity: No Food Insecurity (03/05/2023)   Hunger Vital Sign    Worried About Running Out of Food in the Last Year: Never true    Ran Out of Food in the Last Year: Never true  Transportation Needs: No Transportation Needs (03/05/2023)   PRAPARE - Administrator, Civil Service (Medical): No    Lack of Transportation (Non-Medical): No  Physical Activity: Sufficiently Active (03/05/2023)   Exercise Vital Sign    Days of Exercise per Week: 5 days    Minutes of Exercise per Session: 150+ min  Stress: No Stress Concern Present (03/05/2023)   [employer] of Occupational Health - Occupational Stress Questionnaire    Feeling of Stress : Not at all  Social Connections: Socially Integrated (03/05/2023)   Social Connection and Isolation Panel [NHANES]    Frequency of Communication with Friends and Family: More than three times a week    Frequency of Social Gatherings with Friends and Family: Once a week    Attends Religious Services: More than 4 times per year    Active Member of [employer] or Organizations: Yes    Attends Banker  Meetings: 1 to 4 times per year    Marital Status: Married     Family History: The patient's family history includes Cancer in his father; Heart disease in his mother; Parkinson's disease in his mother.  ROS:   Please see the history of present illness.     All other systems reviewed and are negative.  EKGs/Labs/Other Studies Reviewed:    The following studies were reviewed today:  EKG Interpretation Date/Time:  Wednesday October 24 2023 10:06:37 EST Ventricular Rate:  71 PR Interval:  140 QRS Duration:  80 QT Interval:  374 QTC Calculation: 406 R Axis:   -21  Text Interpretation: Normal sinus rhythm Normal ECG Confirmed by Debbe Odea ([id number]) on 10/24/2023 10:12:38 AM  Recent Labs: 06/14/2023: ALT 25; BUN 21; Creat 1.12; Hemoglobin 14.3; Platelets 290; Potassium 4.2; Sodium 140  Recent Lipid Panel    Component Value Date/Time   CHOL 128 06/14/2023 1144   CHOL 127 09/24/2019 1002   TRIG 77 06/14/2023 1144   HDL 65 06/14/2023 1144   HDL 64 09/24/2019 1002   CHOLHDL 2.0 06/14/2023 1144   VLDL 27 03/14/2018 0917   LDLCALC 47 06/14/2023 1144     Risk Assessment/Calculations:         Physical Exam:    VS:  BP (!) 140/72 (BP Location: Left Arm, Patient Position: Sitting, Cuff Size: Normal)   Ht 5\' 10"  (1.778 m)   Wt 204 lb (92.5 kg)   SpO2 (!) 71%   BMI 29.27 kg/m     Wt Readings from Last 3 Encounters:  10/24/23 204 lb (92.5 kg)  10/23/23 207 lb 4.8 oz (94 kg)  09/24/23 207 lb 4.8 oz (94 kg)     GEN:  Well nourished, well developed in no acute distress HEENT: Normal NECK: No JVD; No carotid bruits CARDIAC: RRR, no murmurs, rubs, gallops RESPIRATORY:  Clear to auscultation without rales, wheezing or rhonchi  ABDOMEN: Soft, non-tender, non-distended MUSCULOSKELETAL:  No edema; No deformity  SKIN: Warm and dry NEUROLOGIC:  Alert and oriented x 3 PSYCHIATRIC:  Normal affect   ASSESSMENT:    1. Coronary artery disease involving native coronary  artery of native heart without angina pectoris   2. Mixed hyperlipidemia   3. Primary hypertension   4. Ascending aorta dilatation (HCC)    PLAN:    In order of problems listed above:  CAD s/p PCI x 5 (DES to LAD, RCA 2011, DES to LAD, LCx, RCA in 2008).  Denies chest pain or shortness of breath.  Compliant with medications as prescribed.  Continue Plavix 75 mg daily, Crestor 40 mg daily.  Obtain echocardiogram. Hyperlipidemia, cholesterol controlled.  Continue Crestor 40 mg daily, Zetia, fenofibrate. Hypertension, BP elevated, usually controlled.  Continue Bystolic 5 mg daily. Mild ascending aorta dilatation on chest CT 01/2023, 4.2 cm.  Monitor serially, evaluated with echo as above.   Follow-up after echo, if echo has no significant structural abnormalities, okay to follow-up yearly.      Medication Adjustments/Labs and Tests Ordered: Current medicines are reviewed at length with the patient today.  Concerns regarding medicines are outlined above.  Orders Placed This Encounter  Procedures   EKG 12-Lead   ECHOCARDIOGRAM COMPLETE   No orders of the defined types were placed in this encounter.   Patient Instructions  Medication Instructions:   Your physician recommends that you continue on your current medications as directed. Please refer to the Current Medication list given to you today.  *If you need a refill on your cardiac medications before your next appointment, please call your pharmacy*   Lab Work:  None Ordered  If you have labs (blood work) drawn today and your tests are completely normal, you will receive your results only by: MyChart Message (if you have MyChart) OR A paper copy in the mail If you have any lab test that is abnormal or we need to change your treatment, we will call you to review the results.   Testing/Procedures:  Your physician has requested that you have an echocardiogram. Echocardiography is a painless test that uses sound waves to create  images of your heart. It provides your doctor with information about the size and shape of your heart and how well your  heart's chambers and valves are working. This procedure takes approximately one hour. There are no restrictions for this procedure. Please do NOT wear cologne, perfume, aftershave, or lotions (deodorant is allowed). Please arrive 15 minutes prior to your appointment time.  Please note: We ask at that you not bring children with you during ultrasound (echo/ vascular) testing. Due to room size and safety concerns, children are not allowed in the ultrasound rooms during exams. Our front office staff cannot provide observation of children in our lobby area while testing is being conducted. An adult accompanying a patient to their appointment will only be allowed in the ultrasound room at the discretion of the ultrasound technician under special circumstances. We apologize for any inconvenience.    Follow-Up: At [facility], you and your health needs are our priority.  As part of our continuing mission to provide you with exceptional heart care, we have created designated Provider Care Teams.  These Care Teams include your primary Cardiologist (physician) and Advanced Practice Providers (APPs -  Physician Assistants and Nurse Practitioners) who all work together to provide you with the care you need, when you need it.  We recommend signing up for the patient portal called "MyChart".  Sign up information is provided on this After Visit Summary.  MyChart is used to connect with patients for Virtual Visits (Telemedicine).  Patients are able to view lab/test results, encounter notes, upcoming appointments, etc.  Non-urgent messages can be sent to your provider as well.   To learn more about what you can do with MyChart, go to [email or web address].    Your next appointment:   4 month(s)  Provider:   You may see Lorine Bears, MD or one of the following Advanced Practice  Providers on your designated Care Team:   Nicolasa Ducking, NP Eula Listen, PA-C Cadence Fransico Michael, PA-C Charlsie Quest, NP Carlos Levering, NP    Signed, Debbe Odea, MD  10/24/2023 10:47 AM    [facility]

## 2023-10-25 DIAGNOSIS — I739 Peripheral vascular disease, unspecified: Secondary | ICD-10-CM | POA: Insufficient documentation

## 2023-10-25 DIAGNOSIS — I7121 Aneurysm of the ascending aorta, without rupture: Secondary | ICD-10-CM | POA: Insufficient documentation

## 2023-10-25 NOTE — Progress Notes (Signed)
MRN : [id number]  Paul Page is a 68 y.o. (06/18/1956) male who presents with chief complaint of check circulation.  History of Present Illness:   The patient presents to the office for evaluation of an ascending thoracic aortic aneurysm. The aneurysm was found incidentally by chest CT scan ordered for follow-up of a lung nodule with a history of pneumonia. Patient denies chest pain or unusual back pain, no other chest or abdominal complaints.  No history of an abrupt onset of a painful toe associated with blue discoloration.     No family history of TAA/AAA.   The patient also notes painful lower extremities. The leg pain occurs frequently at night while the patient is in bed.   The patient describes it as a cramping or Charley horse type pain. The patient notes the pain isn't associated with activity and is not very consistent day to day. The pain seems to be variable with time. Typically the pain occurs with varying positions and seems to progress until the leg is stretched. The pain has been progressive over the past several years which has prompted the concern for evaluation. The patient states this inability to walk has a significant negative impact on her quality of life and daily activities.  There is no history of claudication or rest pain symptoms of the lower extremities.   The patient denies a history of degenerative spine disease.  No prior vascular interventions or vascular surgeries.  Patient denies amaurosis fugax or TIA symptoms.    The patient denies angina or shortness of breath.  CT scan dated 01/02/2023, shows an ascending TAA that measures 4.1 cm.  CT scanning of the abdomen for renal stone dated 12/04/2015 is reviewed by me personally there is no evidence for abdominal or iliac artery aneurysms.  There is a 40 to 50% calcific stenosis of the right common iliac artery.  There is very mild  atherosclerotic changes at the origins of the mesenteric vessels  No outpatient medications have been marked as taking for the 10/29/23 encounter (Appointment) with Renford Dills, MD.    Past Medical History:  Diagnosis Date   Allergy    Anxiety    Arthritis    Asthma    Coronary artery disease    Three-vessel coronary artery disease diagnosed in October 2008 with normal ejection fraction. PCI and drug-eluting stent placement to the LAD(3.0 x 8 mm Promus), left circumflex(2.5 x 12 mm Promus) and right coronary artery(3.5 x 12 mm Promus). Unstable angina in 2011. PCI of RCA with a 3.5 x 15 mm Promus and PCI of LAD with a 2.5 x 28 mm Promus drug-eluting stents    GERD (gastroesophageal reflux disease)    Heart disease    Hyperlipidemia    Hypertension    Traumatic pneumothorax 09/04/2011   Overview:   08/22/11  S/P fall, right.    Past Surgical History:  Procedure Laterality Date   ANKLE FRACTURE SURGERY     BIOPSY  08/10/2023   Procedure: BIOPSY;  Surgeon: Wyline Mood, MD;  Location: [facility] ENDOSCOPY;  Service: Gastroenterology;;   CARDIAC CATHETERIZATION  10/02/2009   CARDIAC CATHETERIZATION  10/02/2006   COLONOSCOPY WITH PROPOFOL N/A 08/10/2023   Procedure: COLONOSCOPY WITH PROPOFOL;  Surgeon: Wyline Mood, MD;  Location: [facility] ENDOSCOPY;  Service: Gastroenterology;  Laterality: N/A;   CORONARY STENT PLACEMENT  2008 & 2011   ESOPHAGOGASTRODUODENOSCOPY (EGD) WITH PROPOFOL N/A 08/10/2023   Procedure: ESOPHAGOGASTRODUODENOSCOPY (EGD) WITH PROPOFOL;  Surgeon: Wyline Mood, MD;  Location: [facility] ENDOSCOPY;  Service: Gastroenterology;  Laterality: N/A;   FRACTURE SURGERY      Social History Social History   Tobacco Use   Smoking status: Never   Smokeless tobacco: Never  Vaping Use   Vaping status: Never Used  Substance Use Topics   Alcohol use: Not Currently    Comment: occasional wine   Drug use: No    Family History Family History  Problem Relation Age of Onset    Parkinson's disease Mother    Heart disease Mother    Cancer Father     Allergies  Allergen Reactions   Molds & Smuts Shortness Of Breath   Ambrosia Trifida (Tall Ragweed) Allergy Skin Test    Fish Oil Hives   Gramineae Pollens    Peanut Oil    Tetracyclines & Related Other (See Comments)    Intolerance - stomach upset     REVIEW OF SYSTEMS (Negative unless checked)  Constitutional: [] Weight loss  [] Fever  [] Chills Cardiac: [] Chest pain   [] Chest pressure   [] Palpitations   [] Shortness of breath when laying flat   [] Shortness of breath with exertion. Vascular:  [x] Pain in legs with walking   [] Pain in legs at rest  [] History of DVT   [] Phlebitis   [] Swelling in legs   [] Varicose veins   [] Non-healing ulcers Pulmonary:   [] Uses home oxygen   [] Productive cough   [] Hemoptysis   [] Wheeze  [] COPD   [x] Asthma Neurologic:  [] Dizziness   [] Seizures   [] History of stroke   [] History of TIA  [] Aphasia   [] Vissual changes   [] Weakness or numbness in arm   [] Weakness or numbness in leg Musculoskeletal:   [] Joint swelling   [] Joint pain   [] Low back pain Hematologic:  [] Easy bruising  [] Easy bleeding   [] Hypercoagulable state   [] Anemic Gastrointestinal:  [] Diarrhea   [] Vomiting  [x] Gastroesophageal reflux/heartburn   [] Difficulty swallowing. Genitourinary:  [] Chronic kidney disease   [] Difficult urination  [] Frequent urination   [] Blood in urine Skin:  [] Rashes   [] Ulcers  Psychological:  [] History of anxiety   []  History of major depression.  Physical Examination  There were no vitals filed for this visit. There is no height or weight on file to calculate BMI. Gen: WD/WN, NAD Head: [place]/AT, No temporalis wasting.  Ear/Nose/Throat: Hearing grossly intact, nares w/o erythema or drainage Eyes: PER, EOMI, sclera nonicteric.  Neck: Supple, no masses.  No bruit or JVD.  Pulmonary:  Good air movement, no audible wheezing, no use of accessory muscles.  Cardiac: RRR, normal S1, S2, no  Murmurs. Vascular: Popliteal and pulses are normal no open wounds Vessel Right Left  Radial Palpable Palpable  PT Palpable Palpable  DP  Palpable Palpable  Gastrointestinal: soft, non-distended. No guarding/no peritoneal signs.  Musculoskeletal: M/S 5/5 throughout.  No visible deformity.  Neurologic: CN 2-12 intact. Pain and light touch intact in extremities.  Symmetrical.  Speech is fluent. Motor exam as listed above. Psychiatric: Judgment intact, Mood & affect appropriate for pt's clinical situation. Dermatologic: No rashes or ulcers noted.  No changes consistent with cellulitis.   CBC Lab Results  Component Value Date   WBC 4.5 06/14/2023   HGB 14.3 06/14/2023   HCT 42.8 06/14/2023   MCV 92.0 06/14/2023  PLT 290 06/14/2023    BMET    Component Value Date/Time   NA 140 06/14/2023 1144   NA 139 09/24/2019 1002   K 4.2 06/14/2023 1144   CL 104 06/14/2023 1144   CO2 28 06/14/2023 1144   GLUCOSE 100 (H) 06/14/2023 1144   BUN 21 06/14/2023 1144   BUN 18 09/24/2019 1002   CREATININE 1.12 06/14/2023 1144   CALCIUM 9.5 06/14/2023 1144   GFRNONAA >60 10/05/2022 0600   GFRNONAA 69 09/19/2018 1057   GFRAA 90 09/24/2019 1002   GFRAA 79 09/19/2018 1057   CrCl cannot be calculated (Patient's most recent lab result is older than the maximum 21 days allowed.).  COAG No results found for: "INR", "PROTIME"  Radiology No results found.   Assessment/Plan 1. Aneurysm of ascending aorta without rupture (HCC) (Primary) Recommend:  No surgery or intervention is indicated at this time.  The patient has an asymptomatic thoracic aortic aneurysm that is less than 6.0 cm in maximal diameter.  I have discussed the natural history of thoracic aortic aneurysm and the small risk of rupture for aneurysm less than 6.5 cm in size.  However, as these small aneurysms tend to enlarge over time, continued surveillance with CT scan is mandatory.   I have also discussed optimizing medical  management with hypertension and lipid control and the negative effect that any tobacco products have on aneurysmal disease.  The patient is also encouraged to exercise a minimum of 30 minutes 4 times a week.   Should the patient develop new onset chest or back pain or signs of peripheral embolization they are instructed to seek medical attention immediately and to alert the physician providing care that they have an aneurysm in the chest.   The patient voices their understanding.  The patient will return as ordered with a CT scan of the chest - CT CHEST WO CONTRAST; Future - CT ABDOMEN PELVIS WO CONTRAST; Future  2. PAD (peripheral artery disease) (HCC)  Recommend:  The patient has evidence of atherosclerosis of the lower extremities.  The patient does not voice lifestyle limiting changes at this point in time.  Noninvasive studies do not suggest clinically significant change.  No invasive studies, angiography or surgery at this time The patient should continue walking and begin a more formal exercise program.  The patient should continue antiplatelet therapy and aggressive treatment of the lipid abnormalities  No changes in the patient's medications at this time  Continued surveillance is indicated as atherosclerosis is likely to progress with time.    The patient will continue follow up with noninvasive studies as ordered.  - CT CHEST WO CONTRAST; Future - CT ABDOMEN PELVIS WO CONTRAST; Future  3. Leg cramps Recommend:  The patient is describing Charley horse type leg cramps. No invasive studies, angiography or surgery at this time.  Magnesium supplementation at bedtime was recommended.    I have reviewed homeopathic remedies such as Cider vinegar or yellow mustard; placing a bar of soap at the bottom of the bed. Quinine (utilizing tonic water) is also an option.  The patient should continue walking and begin a more formal exercise program.  The patient should continue  antiplatelet therapy and aggressive treatment of the lipid abnormalities  4. Coronary artery disease involving native coronary artery of native heart without angina pectoris Continue cardiac and antihypertensive medications as already ordered and reviewed, no changes at this time.  Continue statin as ordered and reviewed, no changes at this time  Nitrates PRN  for chest pain  5. Primary hypertension Continue antihypertensive medications as already ordered, these medications have been reviewed and there are no changes at this time.  6. Pure hypercholesterolemia Continue statin as ordered and reviewed, no changes at this time    Levora Dredge, MD  10/25/2023 3:08 PM

## 2023-10-26 ENCOUNTER — Encounter (INDEPENDENT_AMBULATORY_CARE_PROVIDER_SITE_OTHER): Payer: Medicare Other | Admitting: Nurse Practitioner

## 2023-10-28 ENCOUNTER — Other Ambulatory Visit: Payer: Self-pay | Admitting: Family Medicine

## 2023-10-28 DIAGNOSIS — J4540 Moderate persistent asthma, uncomplicated: Secondary | ICD-10-CM

## 2023-10-29 ENCOUNTER — Ambulatory Visit (INDEPENDENT_AMBULATORY_CARE_PROVIDER_SITE_OTHER): Payer: Medicare Other | Admitting: Vascular Surgery

## 2023-10-29 ENCOUNTER — Encounter (INDEPENDENT_AMBULATORY_CARE_PROVIDER_SITE_OTHER): Payer: Self-pay | Admitting: Vascular Surgery

## 2023-10-29 VITALS — BP 134/79 | HR 71 | Resp 16 | Ht 70.0 in | Wt 200.2 lb

## 2023-10-29 DIAGNOSIS — E7800 Pure hypercholesterolemia, unspecified: Secondary | ICD-10-CM

## 2023-10-29 DIAGNOSIS — R252 Cramp and spasm: Secondary | ICD-10-CM

## 2023-10-29 DIAGNOSIS — I7121 Aneurysm of the ascending aorta, without rupture: Secondary | ICD-10-CM

## 2023-10-29 DIAGNOSIS — I10 Essential (primary) hypertension: Secondary | ICD-10-CM

## 2023-10-29 DIAGNOSIS — I739 Peripheral vascular disease, unspecified: Secondary | ICD-10-CM

## 2023-10-29 DIAGNOSIS — I2510 Atherosclerotic heart disease of native coronary artery without angina pectoris: Secondary | ICD-10-CM | POA: Diagnosis not present

## 2023-11-12 ENCOUNTER — Ambulatory Visit: Payer: Medicare Other | Attending: Cardiology

## 2023-11-12 DIAGNOSIS — I2510 Atherosclerotic heart disease of native coronary artery without angina pectoris: Secondary | ICD-10-CM | POA: Diagnosis not present

## 2023-11-12 LAB — ECHOCARDIOGRAM COMPLETE
AV Mean grad: 4 mm[Hg]
AV Peak grad: 7 mm[Hg]
Ao pk vel: 1.32 m/s
Area-P 1/2: 3.65 cm2
S' Lateral: 3 cm

## 2023-11-17 ENCOUNTER — Other Ambulatory Visit: Payer: Self-pay | Admitting: Family Medicine

## 2023-11-17 DIAGNOSIS — I2510 Atherosclerotic heart disease of native coronary artery without angina pectoris: Secondary | ICD-10-CM

## 2023-11-19 ENCOUNTER — Telehealth (INDEPENDENT_AMBULATORY_CARE_PROVIDER_SITE_OTHER): Payer: Self-pay | Admitting: Vascular Surgery

## 2023-11-19 NOTE — Telephone Encounter (Signed)
LVM for pt advising that no prior auth is required for the CT's ordered by Dr. Gilda Crease. I gave phone number of [phone] to schedule the appointments. I also asked for a return call to the office to set up a CT results appt with Dr. Gilda Crease.

## 2023-11-23 ENCOUNTER — Encounter (INDEPENDENT_AMBULATORY_CARE_PROVIDER_SITE_OTHER): Payer: Self-pay | Admitting: Vascular Surgery

## 2024-01-07 NOTE — Telephone Encounter (Signed)
 This is not a scan ordered by me.  This is a scan ordered by Dr. Gilda Crease to look at his aorta.  I did not order it.  I see an order from Dr. Gilda Crease placed in January.  He will need to contact Dr. Marijean Heath office.

## 2024-01-08 NOTE — Telephone Encounter (Signed)
 The patient was seen 10/31/22 and CT done 01/02/23 order was placed on 01/03/23 to have 1 year follow up CT. I called on 12/20/23 LVM. When the patient responded the CT order had expired on 01/03/24. He might as well just schedule the CT that Dr. Sharlyn Bologna has ordered

## 2024-01-11 ENCOUNTER — Other Ambulatory Visit: Payer: Self-pay | Admitting: Pulmonary Disease

## 2024-01-11 DIAGNOSIS — R911 Solitary pulmonary nodule: Secondary | ICD-10-CM

## 2024-01-11 NOTE — Progress Notes (Unsigned)
 Patient CT order had expired, placed new CT ordered.  Following a lung nodule.  Gailen Shelter, MD Advanced Bronchoscopy PCCM [facility]

## 2024-01-14 NOTE — Telephone Encounter (Signed)
 The patient came by the office on 4/11 and ask for Dr. Viva Grise to order the CT for him to be done. I have left him a message asking him to call and schedule his CT

## 2024-01-20 ENCOUNTER — Other Ambulatory Visit: Payer: Self-pay | Admitting: Family Medicine

## 2024-01-20 DIAGNOSIS — I10 Essential (primary) hypertension: Secondary | ICD-10-CM

## 2024-01-20 DIAGNOSIS — I2510 Atherosclerotic heart disease of native coronary artery without angina pectoris: Secondary | ICD-10-CM

## 2024-01-22 NOTE — Telephone Encounter (Signed)
 I have spoke with Paul Page and his CT has been scheduled on 02/06/24 @ 10:00am at [facility] Entrance and he is aware

## 2024-02-06 ENCOUNTER — Ambulatory Visit
Admission: RE | Admit: 2024-02-06 | Discharge: 2024-02-06 | Disposition: A | Discharge status: Home / Self Care | Source: Ambulatory Visit | Attending: Family Medicine | Admitting: Family Medicine

## 2024-02-06 DIAGNOSIS — R911 Solitary pulmonary nodule: Secondary | ICD-10-CM | POA: Insufficient documentation

## 2024-02-19 ENCOUNTER — Encounter (INDEPENDENT_AMBULATORY_CARE_PROVIDER_SITE_OTHER): Payer: Self-pay

## 2024-02-20 ENCOUNTER — Ambulatory Visit: Payer: Self-pay | Admitting: Pulmonary Disease

## 2024-02-21 ENCOUNTER — Ambulatory Visit: Payer: Medicare Other | Admitting: Cardiology

## 2024-03-24 ENCOUNTER — Ambulatory Visit (INDEPENDENT_AMBULATORY_CARE_PROVIDER_SITE_OTHER): Payer: Self-pay | Admitting: Family Medicine

## 2024-03-24 ENCOUNTER — Telehealth: Payer: Self-pay | Admitting: Family Medicine

## 2024-03-24 VITALS — BP 124/72 | HR 76 | Resp 16 | Ht 70.0 in | Wt 204.8 lb

## 2024-03-24 DIAGNOSIS — I7789 Other specified disorders of arteries and arterioles: Secondary | ICD-10-CM | POA: Diagnosis not present

## 2024-03-24 DIAGNOSIS — J4540 Moderate persistent asthma, uncomplicated: Secondary | ICD-10-CM

## 2024-03-24 DIAGNOSIS — N138 Other obstructive and reflux uropathy: Secondary | ICD-10-CM

## 2024-03-24 DIAGNOSIS — I700 Atherosclerosis of aorta: Secondary | ICD-10-CM

## 2024-03-24 DIAGNOSIS — R252 Cramp and spasm: Secondary | ICD-10-CM | POA: Insufficient documentation

## 2024-03-24 DIAGNOSIS — F419 Anxiety disorder, unspecified: Secondary | ICD-10-CM

## 2024-03-24 DIAGNOSIS — I739 Peripheral vascular disease, unspecified: Secondary | ICD-10-CM | POA: Diagnosis not present

## 2024-03-24 DIAGNOSIS — K219 Gastro-esophageal reflux disease without esophagitis: Secondary | ICD-10-CM

## 2024-03-24 DIAGNOSIS — R7303 Prediabetes: Secondary | ICD-10-CM

## 2024-03-24 DIAGNOSIS — I2510 Atherosclerotic heart disease of native coronary artery without angina pectoris: Secondary | ICD-10-CM | POA: Diagnosis not present

## 2024-03-24 DIAGNOSIS — N401 Enlarged prostate with lower urinary tract symptoms: Secondary | ICD-10-CM

## 2024-03-24 DIAGNOSIS — I10 Essential (primary) hypertension: Secondary | ICD-10-CM

## 2024-03-24 MED ORDER — CLOPIDOGREL BISULFATE 75 MG PO TABS: 75.0000 mg | ORAL_TABLET | Freq: Every day | ORAL | 1 refills | Status: DC

## 2024-03-24 MED ORDER — ROSUVASTATIN CALCIUM 40 MG PO TABS: 40.0000 mg | ORAL_TABLET | Freq: Every day | ORAL | 1 refills | Status: DC

## 2024-03-24 MED ORDER — FLUTICASONE FUROATE-VILANTEROL 100-25 MCG/ACT IN AEPB: 1.0000 | INHALATION_SPRAY | Freq: Every day | RESPIRATORY_TRACT | 1 refills | Status: DC

## 2024-03-24 MED ORDER — NEBIVOLOL HCL 5 MG PO TABS: 5.0000 mg | ORAL_TABLET | Freq: Every day | ORAL | 1 refills | Status: DC

## 2024-03-24 MED ORDER — EZETIMIBE 10 MG PO TABS: 10.0000 mg | ORAL_TABLET | Freq: Every day | ORAL | 1 refills | Status: DC

## 2024-03-24 MED ORDER — PANTOPRAZOLE SODIUM 40 MG PO TBEC: 40.0000 mg | DELAYED_RELEASE_TABLET | Freq: Every day | ORAL | 1 refills | Status: DC

## 2024-03-24 MED ORDER — ESCITALOPRAM OXALATE 10 MG PO TABS: 10.0000 mg | ORAL_TABLET | Freq: Every day | ORAL | 1 refills | Status: DC

## 2024-03-24 MED ORDER — FENOFIBRATE 145 MG PO TABS: 145.0000 mg | ORAL_TABLET | Freq: Every day | ORAL | 1 refills | Status: DC

## 2024-03-24 NOTE — Progress Notes (Signed)
 Name: Juquan Reznick   MRN: [id number]    DOB: 12-Apr-1956   Date:03/24/2024       Progress Note  Subjective  Chief Complaint  Chief Complaint  Patient presents with   Medical Management of Chronic Issues   Discussed the use of AI scribe software for clinical note transcription with the patient, who gave verbal consent to proceed.  History of Present Illness Paul Page is a 68 year old male with hypertension and coronary artery disease who presents for a six-month follow-up visit.  He maintains a stable weight with minor fluctuations and tries to eat healthily by avoiding fried foods and using an air fryer. He drinks diet sodas regularly but also consumes water throughout the day. He has not consumed alcohol in months.  He recently had a molar removed due to a severe abscess that previously led to sepsis. The procedure was performed by an oral surgeon after a CT scan of his jaw was performed at the endodontist's office. The tooth had undergone a root canal decades ago. Post-extraction, he experienced no pain and minimal bleeding, using chlorhexidine mouthwash as prescribed. He did not receive antibiotics post-procedure. He reports improved sinus congestion since the extraction.  He has a history of peripheral artery disease and experiences leg cramps, which he manages with magnesium and D3 supplements. He notes that magnesium gluconate is less effective for him than other forms. He is physically active at work, often walking over 10,000 steps a day, and takes potassium supplements when working in heat to prevent cramps.  He has a thoracic ascending aortic aneurysm and coronary artery disease, with a history of multiple stents placed in 2008 and 2011. He takes Plavix  and rosuvastatin  for his heart condition. His recent echocardiogram showed mild mitral and aortic valve regurgitation. He manages his hypertension with Bystolic  5 mg daily and his cholesterol with ezetimibe , rosuvastatin  40 mg,  and fenofibrate  145 mg. His LDL and triglycerides are well-controlled.  He has pre-diabetes with an A1c of 6.2 and is trying to reduce carbohydrate intake. Despite a physically demanding job, he is surprised by his stable weight. He feels strong and healthy.  He experiences mild symptoms of benign prostatic hyperplasia, including post-void dribbling, but does not find it bothersome enough to require medication.  He has mild persistent asthma, which is well-controlled with Breo, though he uses albuterol  more during spring and fall due to allergies. He reports no asthma symptoms at work.  He takes Lexapro  for anxiety, which helps maintain emotional stability. He has a family history of mental health issues, including his father who was on lithium and a sister with schizoaffective disorder.    Patient Active Problem List   Diagnosis Date Noted   Thoracic ascending aortic aneurysm (HCC) 10/25/2023   PAD (peripheral artery disease) (HCC) 10/25/2023   BPH with obstruction/lower urinary tract symptoms 06/14/2023   Atherosclerosis of aorta (HCC) 03/05/2023   DOE (dyspnea on exertion) 10/10/2022   Pulmonary nodule 10/05/2022   History of heart artery stent 02/07/2022   History of pneumonia 02/07/2022   Moderate persistent asthma without complication 02/07/2022   History of shingles 02/07/2022   Fever blister 02/07/2022   Prediabetes 09/20/2018   Hypertension 04/12/2015   GERD (gastroesophageal reflux disease) 03/22/2015   Anxiety 03/22/2015   Pure hypercholesterolemia 12/10/2012   Family history of ischemic heart disease (IHD) 10/24/2012   History of fracture of rib 09/04/2011   Coronary artery disease involving native coronary artery of native heart without angina pectoris 08/01/2007  Past Surgical History:  Procedure Laterality Date   ANKLE FRACTURE SURGERY     BIOPSY  08/10/2023   Procedure: BIOPSY;  Surgeon: Therisa Bi, MD;  Location: [facility] ENDOSCOPY;  Service: Gastroenterology;;    CARDIAC CATHETERIZATION  10/02/2009   CARDIAC CATHETERIZATION  10/02/2006   COLONOSCOPY WITH PROPOFOL  N/A 08/10/2023   Procedure: COLONOSCOPY WITH PROPOFOL ;  Surgeon: Therisa Bi, MD;  Location[facility] ENDOSCOPY;  Service: Gastroenterology;  Laterality: N/A;   CORONARY STENT PLACEMENT  2008 & 2011   ESOPHAGOGASTRODUODENOSCOPY (EGD) WITH PROPOFOL  N/A 08/10/2023   Procedure: ESOPHAGOGASTRODUODENOSCOPY (EGD) WITH PROPOFOL ;  Surgeon: Therisa Bi, MD;  Location: [facility] ENDOSCOPY;  Service: Gastroenterology;  Laterality: N/A;   FRACTURE SURGERY      Family History  Problem Relation Age of Onset   Parkinson's disease Mother    Heart disease Mother    Heart attack Mother    Cancer Father     Social History   Tobacco Use   Smoking status: Never   Smokeless tobacco: Never  Substance Use Topics   Alcohol use: Not Currently    Comment: occasional wine     Current Outpatient Medications:    albuterol  (VENTOLIN  HFA) 108 (90 Base) MCG/ACT inhaler, TAKE 2 PUFFS BY MOUTH EVERY 6 HOURS AS NEEDED FOR WHEEZE OR SHORTNESS OF BREATH, Disp: 8.5 each, Rfl: 1   BREO ELLIPTA  100-25 MCG/ACT AEPB, INHALE 1 PUFF BY MOUTH EVERY DAY, Disp: 180 each, Rfl: 1   clopidogrel  (PLAVIX ) 75 MG tablet, TAKE 1 TABLET BY MOUTH EVERY DAY, Disp: 90 tablet, Rfl: 1   escitalopram  (LEXAPRO ) 10 MG tablet, TAKE 1 TABLET BY MOUTH EVERY DAY, Disp: 90 tablet, Rfl: 1   ezetimibe  (ZETIA ) 10 MG tablet, TAKE 1 TABLET BY MOUTH EVERY DAY, Disp: 90 tablet, Rfl: 1   fenofibrate  (TRICOR ) 145 MG tablet, TAKE 1 TABLET BY MOUTH EVERY DAY, Disp: 90 tablet, Rfl: 1   nebivolol  (BYSTOLIC ) 5 MG tablet, TAKE 1 TABLET (5 MG TOTAL) BY MOUTH DAILY., Disp: 90 tablet, Rfl: 0   pantoprazole  (PROTONIX ) 40 MG tablet, TAKE 1 TABLET BY MOUTH EVERY DAY, Disp: 90 tablet, Rfl: 1   rosuvastatin  (CRESTOR ) 40 MG tablet, TAKE 1 TABLET BY MOUTH EVERY DAY, Disp: 90 tablet, Rfl: 1  Allergies  Allergen Reactions   Molds & Smuts Shortness Of Breath   Ambrosia Trifida  (Tall Ragweed) Allergy Skin Test    Fish Oil  Hives   Gramineae Pollens    Peanut Oil    Tetracyclines & Related Other (See Comments)    Intolerance - stomach upset    I personally reviewed active problem list, medication list, allergies, family history with the patient/caregiver today.   ROS  Ten systems reviewed and is negative except as mentioned in HPI    Objective Physical Exam Constitutional: Patient appears well-developed and well-nourished.  No distress.  HEENT: head atraumatic, normocephalic, pupils equal and reactive to light, neck supple Cardiovascular: Normal rate, regular rhythm and normal heart sounds.  No murmur heard. No BLE edema. Pulmonary/Chest: Effort normal and breath sounds normal. No respiratory distress. Abdominal: Soft.  There is no tenderness. Psychiatric: Patient has a normal mood and affect. behavior is normal. Judgment and thought content normal.     Vitals:   03/24/24 1515  BP: 124/72  Pulse: 76  Resp: 16  SpO2: 96%  Weight: 204 lb 12.8 oz (92.9 kg)  Height: 5' 10 (1.778 m)    Body mass index is 29.39 kg/m.   PHQ2/9:    03/24/2024  3:07 PM 09/24/2023    3:11 PM 06/14/2023   10:31 AM 04/30/2023   10:17 AM 03/05/2023    8:38 AM  Depression screen PHQ 2/9  Decreased Interest 0 0 0 0 0  Down, Depressed, Hopeless 0 0 0 0 0  PHQ - 2 Score 0 0 0 0 0  Altered sleeping  0 0  0  Tired, decreased energy  0 0  0  Change in appetite  0 0  0  Feeling bad or failure about yourself   0 0  0  Trouble concentrating  0 0  0  Moving slowly or fidgety/restless  0 0  0  Suicidal thoughts  0 0  0  PHQ-9 Score  0 0  0  Difficult doing work/chores  Not difficult at all Not difficult at all      phq 9 is negative  Fall Risk:    03/24/2024    3:07 PM 09/24/2023    3:07 PM 06/14/2023   10:31 AM 04/30/2023   10:16 AM 10/17/2022    2:33 PM  Fall Risk   Falls in the past year? 0 0 0 0 0  Number falls in past yr: 0 0 0 0 0  Injury with Fall? 0 0 0 0 0   Risk for fall due to : No Fall Risks No Fall Risks No Fall Risks  No Fall Risks  Follow up Falls prevention discussed;Education provided;Falls evaluation completed Falls prevention discussed;Education provided;Falls evaluation completed Falls prevention discussed;Education provided;Falls evaluation completed  Falls prevention discussed      Data saved with a previous flowsheet row definition      Assessment & Plan Coronary artery disease with stents Asymptomatic with normal cardiac function. Mild mitral and aortic valve regurgitation noted. - Continue clopidogrel  and rosuvastatin .  Thoracic aortic aneurysm Mild dilation of ascending thoracic aorta. No intervention required. - Maintain blood pressure and cholesterol control.  Peripheral artery disease Leg cramps managed with magnesium. Magnesium glycinate preferred. - Continue magnesium supplementation. - Consider electrolyte supplements for physical activity.  Hypertension Nebivolol  5 mg daily. Current BP 124/72 mmHg. - Continue nebivolol  5 mg daily.  Mild mitral and aortic valve regurgitation No significant symptoms or hemodynamic compromise. - Regular monitoring advised.  Pre-diabetes A1c 6.2%. Advised on carbohydrate reduction and physical activity.  Benign prostatic hyperplasia with lower urinary tract symptoms Mild symptoms not requiring medication.  Mild persistent asthma Inhaler: Breo. Increased albuterol  use in spring. - Continue Breo. - Use albuterol  as needed.  Anxiety managed with escitalopram  Stable mood with escitalopram . No significant side effects. - Continue escitalopram .

## 2024-03-24 NOTE — Telephone Encounter (Signed)
Please schedule medicare wellness visit.

## 2024-03-27 NOTE — Telephone Encounter (Signed)
 Left vm with direct dial info for patient to call back to schedule Medicare Annual Wellness Visit  Stefano ORN, [facility]  [facility] AWV Team Direct Dial: [phone]

## 2024-05-15 ENCOUNTER — Encounter: Payer: Self-pay | Admitting: Family Medicine

## 2024-05-15 ENCOUNTER — Other Ambulatory Visit: Payer: Self-pay

## 2024-05-15 DIAGNOSIS — J4540 Moderate persistent asthma, uncomplicated: Secondary | ICD-10-CM

## 2024-05-16 MED ORDER — ALBUTEROL SULFATE HFA 108 (90 BASE) MCG/ACT IN AERS: 2.0000 | INHALATION_SPRAY | RESPIRATORY_TRACT | 0 refills | Status: DC | PRN

## 2024-06-10 ENCOUNTER — Other Ambulatory Visit: Payer: Self-pay | Admitting: Family Medicine

## 2024-06-10 DIAGNOSIS — J4540 Moderate persistent asthma, uncomplicated: Secondary | ICD-10-CM

## 2024-06-13 ENCOUNTER — Encounter: Payer: Self-pay | Admitting: Family Medicine

## 2024-06-18 ENCOUNTER — Encounter: Admitting: Family Medicine

## 2024-06-26 ENCOUNTER — Other Ambulatory Visit: Payer: Self-pay | Admitting: Family Medicine

## 2024-06-26 DIAGNOSIS — J4540 Moderate persistent asthma, uncomplicated: Secondary | ICD-10-CM

## 2024-06-27 ENCOUNTER — Encounter: Payer: Self-pay | Admitting: Family Medicine

## 2024-06-27 DIAGNOSIS — J4540 Moderate persistent asthma, uncomplicated: Secondary | ICD-10-CM

## 2024-06-27 MED ORDER — FLUTICASONE FUROATE-VILANTEROL 100-25 MCG/ACT IN AEPB: 1.0000 | INHALATION_SPRAY | Freq: Every day | RESPIRATORY_TRACT | 1 refills | Status: DC

## 2024-07-22 ENCOUNTER — Other Ambulatory Visit: Payer: Self-pay | Admitting: Family Medicine

## 2024-07-22 DIAGNOSIS — J4540 Moderate persistent asthma, uncomplicated: Secondary | ICD-10-CM

## 2024-08-03 ENCOUNTER — Encounter: Payer: Self-pay | Admitting: Family Medicine

## 2024-08-21 ENCOUNTER — Other Ambulatory Visit: Payer: Self-pay | Admitting: Family Medicine

## 2024-08-21 DIAGNOSIS — J4540 Moderate persistent asthma, uncomplicated: Secondary | ICD-10-CM

## 2024-08-23 NOTE — Telephone Encounter (Signed)
 Requested Prescriptions  Pending Prescriptions Disp Refills   albuterol  (VENTOLIN  HFA) 108 (90 Base) MCG/ACT inhaler [Pharmacy Med Name: ALBUTEROL  HFA (PROAIR ) INHALER] 8.5 each 1    Sig: INHALE 2 PUFFS INTO THE LUNGS EVERY 4 HOURS AS NEEDED FOR WHEEZING OR SHORTNESS OF BREATH.     Pulmonology:  Beta Agonists 2 Passed - 08/23/2024 11:55 AM      Passed - Last BP in normal range    BP Readings from Last 1 Encounters:  03/24/24 124/72         Passed - Last Heart Rate in normal range    Pulse Readings from Last 1 Encounters:  03/24/24 76         Passed - Valid encounter within last 12 months    Recent Outpatient Visits           5 months ago Atherosclerosis of aorta   [facility] Glenard Mire, MD       Future Appointments             In 1 month Glenard, Krichna, MD [facility], [place]

## 2024-09-23 ENCOUNTER — Encounter: Payer: Self-pay | Admitting: Family Medicine

## 2024-09-23 ENCOUNTER — Ambulatory Visit: Admitting: Family Medicine

## 2024-09-23 VITALS — BP 128/79 | HR 75 | Resp 16 | Ht 70.0 in | Wt 204.4 lb

## 2024-09-23 DIAGNOSIS — R7303 Prediabetes: Secondary | ICD-10-CM | POA: Diagnosis not present

## 2024-09-23 DIAGNOSIS — I700 Atherosclerosis of aorta: Secondary | ICD-10-CM

## 2024-09-23 DIAGNOSIS — I10 Essential (primary) hypertension: Secondary | ICD-10-CM | POA: Diagnosis not present

## 2024-09-23 DIAGNOSIS — I739 Peripheral vascular disease, unspecified: Secondary | ICD-10-CM | POA: Diagnosis not present

## 2024-09-23 DIAGNOSIS — N138 Other obstructive and reflux uropathy: Secondary | ICD-10-CM

## 2024-09-23 DIAGNOSIS — N401 Enlarged prostate with lower urinary tract symptoms: Secondary | ICD-10-CM

## 2024-09-23 DIAGNOSIS — I2510 Atherosclerotic heart disease of native coronary artery without angina pectoris: Secondary | ICD-10-CM | POA: Diagnosis not present

## 2024-09-23 DIAGNOSIS — R252 Cramp and spasm: Secondary | ICD-10-CM

## 2024-09-23 DIAGNOSIS — K219 Gastro-esophageal reflux disease without esophagitis: Secondary | ICD-10-CM

## 2024-09-23 DIAGNOSIS — F419 Anxiety disorder, unspecified: Secondary | ICD-10-CM

## 2024-09-23 DIAGNOSIS — I7121 Aneurysm of the ascending aorta, without rupture: Secondary | ICD-10-CM

## 2024-09-23 DIAGNOSIS — J4540 Moderate persistent asthma, uncomplicated: Secondary | ICD-10-CM | POA: Diagnosis not present

## 2024-09-23 MED ORDER — ESCITALOPRAM OXALATE 10 MG PO TABS: 10.0000 mg | ORAL_TABLET | Freq: Every day | ORAL | 1 refills | Status: AC

## 2024-09-23 MED ORDER — CLOPIDOGREL BISULFATE 75 MG PO TABS: 75.0000 mg | ORAL_TABLET | Freq: Every day | ORAL | 1 refills | Status: AC

## 2024-09-23 MED ORDER — FLUTICASONE FUROATE-VILANTEROL 100-25 MCG/ACT IN AEPB: 1.0000 | INHALATION_SPRAY | Freq: Every day | RESPIRATORY_TRACT | 1 refills | Status: AC

## 2024-09-23 MED ORDER — PANTOPRAZOLE SODIUM 40 MG PO TBEC: 40.0000 mg | DELAYED_RELEASE_TABLET | Freq: Every day | ORAL | 1 refills | Status: AC

## 2024-09-23 MED ORDER — ROSUVASTATIN CALCIUM 40 MG PO TABS: 40.0000 mg | ORAL_TABLET | Freq: Every day | ORAL | 1 refills | Status: AC

## 2024-09-23 MED ORDER — FENOFIBRATE 145 MG PO TABS: 145.0000 mg | ORAL_TABLET | Freq: Every day | ORAL | 1 refills | Status: AC

## 2024-09-23 MED ORDER — NEBIVOLOL HCL 5 MG PO TABS: 5.0000 mg | ORAL_TABLET | Freq: Every day | ORAL | 1 refills | Status: AC

## 2024-09-23 MED ORDER — EZETIMIBE 10 MG PO TABS: 10.0000 mg | ORAL_TABLET | Freq: Every day | ORAL | 1 refills | Status: AC

## 2024-09-23 NOTE — Progress Notes (Signed)
 Name: Paul Page   MRN: [id number]    DOB: [birth year]   Date:09/23/2024       Progress Note  Subjective  Chief Complaint  Chief Complaint  Patient presents with   Medical Management of Chronic Issues   Discussed the use of AI scribe software for clinical note transcription with the patient, who gave verbal consent to proceed.  History of Present Illness Paul Page is a 68 year old male who presents for a routine follow-up.  His weight has stabilized at 204.4 pounds, consistent with his last recorded weight six months ago. He is pleased with maintaining his weight through the holidays.  He continues to use Breo daily for his moderate persistent asthma, which is well-controlled with no recent episodes of cough, wheezing, or shortness of breath. He rarely uses his rescue inhaler, Ventolin .  Regarding his coronary artery disease and atherosclerosis of the aorta, he has a history of peripheral arterial disease and a thoracic ascending aorta aneurysm. He recalls a recent call about a CT scan request but has not followed up on it yet. His last CT scan in May showed no change in the size of the thoracic aorta, which remains at 4 centimeters. He takes Plavix  75 mg, Zetia , and Rosuvastatin  40 mg. He experiences leg cramps, particularly at night, which he manages with magnesium glycinate. He attributes the cramps to muscle fatigue from his active job, which involves a lot of walking.  He has a family history of prostate cancer and experiences symptoms of prostate enlargement.  He has a history of high blood pressure and prediabetes. No symptoms of hyperglycemia such as increased hunger or thirst. His last A1c was 6.2. He takes Bystolic  5 mg for high blood pressure and fenofibrate  for lipid management. He previously used Vascepa  for triglyceride control but had issues with insurance coverage.  He takes pantoprazole  daily for gastroesophageal reflux disease and has no current symptoms of  reflux. He follows a diet low in spicy foods and does not add salt or pepper to his meals.  He takes Lexapro  10 mg for anxiety and has no issues with anxiety or depression, particularly enjoying the colder weather. He values staying active and engaged with work for mental well-being.  He is very active at work, walking approximately 13,000 steps daily, and acknowledges the need to increase his fluid intake to prevent dehydration and muscle cramps.    Patient Active Problem List   Diagnosis Date Noted   Leg cramps 03/24/2024   Thoracic ascending aortic aneurysm 10/25/2023   PAD (peripheral artery disease) 10/25/2023   BPH with obstruction/lower urinary tract symptoms 06/14/2023   Atherosclerosis of aorta 03/05/2023   DOE (dyspnea on exertion) 10/10/2022   Pulmonary nodule 10/05/2022   History of heart artery stent 02/07/2022   History of pneumonia 02/07/2022   Moderate persistent asthma without complication 02/07/2022   History of shingles 02/07/2022   Fever blister 02/07/2022   Prediabetes 09/20/2018   Hypertension 04/12/2015   GERD (gastroesophageal reflux disease) 03/22/2015   Anxiety 03/22/2015   Pure hypercholesterolemia 12/10/2012   Family history of ischemic heart disease (IHD) 10/24/2012   History of fracture of rib 09/04/2011   Coronary artery disease involving native coronary artery of native heart without angina pectoris 08/01/2007    Past Surgical History:  Procedure Laterality Date   ANKLE FRACTURE SURGERY     BIOPSY  08/10/2023   Procedure: BIOPSY;  Surgeon: Therisa Bi, MD;  Location: [facility] ENDOSCOPY;  Service: Gastroenterology;;   CARDIAC CATHETERIZATION  10/02/2009   CARDIAC CATHETERIZATION  10/02/2006   COLONOSCOPY WITH PROPOFOL  N/A 08/10/2023   Procedure: COLONOSCOPY WITH PROPOFOL ;  Surgeon: Therisa Bi, MD;  Location: [facility] ENDOSCOPY;  Service: Gastroenterology;  Laterality: N/A;   CORONARY STENT PLACEMENT  2008 & 2011   ESOPHAGOGASTRODUODENOSCOPY (EGD) WITH  PROPOFOL  N/A 08/10/2023   Procedure: ESOPHAGOGASTRODUODENOSCOPY (EGD) WITH PROPOFOL ;  Surgeon: Therisa Bi, MD;  Location: [facility] ENDOSCOPY;  Service: Gastroenterology;  Laterality: N/A;   FRACTURE SURGERY      Family History  Problem Relation Age of Onset   Parkinson's disease Mother    Heart disease Mother    Heart attack Mother    Cancer Father     Social History   Tobacco Use   Smoking status: Never   Smokeless tobacco: Never  Substance Use Topics   Alcohol use: Not Currently    Comment: occasional wine    Current Medications[1]  Allergies[2]  I personally reviewed active problem list, medication list, allergies, family history with the patient/caregiver today.   ROS  Ten systems reviewed and is negative except as mentioned in HPI    Objective Physical Exam  CONSTITUTIONAL: Patient appears well-developed and well-nourished.  No distress. HEENT: Head atraumatic, normocephalic, neck supple. CARDIOVASCULAR: Normal rate, regular rhythm and normal heart sounds.  No murmur heard. No BLE edema. PULMONARY: Effort normal and breath sounds normal. No respiratory distress. ABDOMINAL: There is no tenderness or distention. MUSCULOSKELETAL: Normal gait. Without gross motor or sensory deficit. PSYCHIATRIC: Patient has a normal mood and affect. behavior is normal. Judgment and thought content normal.  Vitals:   09/23/24 1444  BP: 128/79  Pulse: 75  Resp: 16  SpO2: 93%  Weight: 204 lb 6.4 oz (92.7 kg)  Height: 5' 10 (1.778 m)    Body mass index is 29.33 kg/m.  No results found for this or any previous visit (from the past 2160 hours).  Diabetic Foot Exam:     PHQ2/9:    09/23/2024    2:39 PM 03/24/2024    3:07 PM 09/24/2023    3:11 PM 06/14/2023   10:31 AM 04/30/2023   10:17 AM  Depression screen PHQ 2/9  Decreased Interest 0 0 0 0 0  Down, Depressed, Hopeless 0 0 0 0 0  PHQ - 2 Score 0 0 0 0 0  Altered sleeping   0 0   Tired, decreased energy   0 0    Change in appetite   0 0   Feeling bad or failure about yourself    0 0   Trouble concentrating   0 0   Moving slowly or fidgety/restless   0 0   Suicidal thoughts   0 0   PHQ-9 Score   0  0    Difficult doing work/chores   Not difficult at all Not difficult at all      Data saved with a previous flowsheet row definition    phq 9 is negative  Fall Risk:    09/23/2024    2:38 PM 03/24/2024    3:07 PM 09/24/2023    3:07 PM 06/14/2023   10:31 AM 04/30/2023   10:16 AM  Fall Risk   Falls in the past year? 0 0 0 0 0  Number falls in past yr: 0 0 0 0 0  Injury with Fall? 0 0  0  0  0   Risk for fall due to : No Fall Risks No Fall Risks No Fall Risks No Fall Risks  Follow up Falls evaluation completed Falls prevention discussed;Education provided;Falls evaluation completed Falls prevention discussed;Education provided;Falls evaluation completed Falls prevention discussed;Education provided;Falls evaluation completed      Data saved with a previous flowsheet row definition     Assessment & Plan Moderate persistent asthma Asthma is well controlled with current medication regimen. - Continue Breo as maintenance inhaler. - Use rescue inhaler as needed.  Coronary artery disease Managed with Plavix , Zetia , and rosuvastatin . No reported complications. Reports leg cramps, possibly related to rosuvastatin  use. - Continue Plavix  75 mg daily. - Continue Zetia  and rosuvastatin . - Monitor LDL levels to ensure he remains below 70. - Consider magnesium supplementation for leg cramps.  Peripheral artery disease Managed with current medications. - Ordered lipid panel.  Atherosclerosis of aorta Monitored with regular imaging. Last CT scan in May showed no change in thoracic aorta size. - Scheduled next CT scan for May 2026.  Aneurysm of ascending aorta Aneurysm is stable with no change in size since last imaging. - Scheduled next CT scan for May 2026.  Essential  hypertension Hypertension is managed with Bystolic . - Continue Bystolic  5 mg daily.  Benign prostatic hyperplasia with lower urinary tract symptoms Prostate enlargement with symptoms. PSA testing is considered due to family history of prostate cancer and age. - Ordered PSA test.  Prediabetes Previous A1c of 6.2. A1c testing is due for monitoring. - Ordered A1c test.  Gastroesophageal reflux disease GERD is well controlled with daily pantoprazole . - Continue pantoprazole  daily.  Anxiety disorder Anxiety is well managed with Lexapro . - Continue Lexapro  10 mg daily.  Leg cramps Intermittent leg cramps, possibly related to physical activity and hydration status. - Increase water intake to at least 60 ounces per day. - Consider electrolyte supplementation with products like Neum. - Continue magnesium supplementation.  Page Health Maintenance Routine health maintenance is up to date. - Ensure flu shot is up to date. - Ensure tetanus vaccination is up to date. - Ensure colonoscopy is up to date. - Schedule memory test annually. - Schedule six-month follow-up physical.        [1]  Current Outpatient Medications:    albuterol  (VENTOLIN  HFA) 108 (90 Base) MCG/ACT inhaler, INHALE 2 PUFFS INTO THE LUNGS EVERY 4 HOURS AS NEEDED FOR WHEEZING OR SHORTNESS OF BREATH., Disp: 8.5 each, Rfl: 1   clopidogrel  (PLAVIX ) 75 MG tablet, Take 1 tablet (75 mg total) by mouth daily., Disp: 90 tablet, Rfl: 1   escitalopram  (LEXAPRO ) 10 MG tablet, Take 1 tablet (10 mg total) by mouth daily., Disp: 90 tablet, Rfl: 1   ezetimibe  (ZETIA ) 10 MG tablet, Take 1 tablet (10 mg total) by mouth daily., Disp: 90 tablet, Rfl: 1   fenofibrate  (TRICOR ) 145 MG tablet, Take 1 tablet (145 mg total) by mouth daily., Disp: 90 tablet, Rfl: 1   fluticasone  furoate-vilanterol (BREO ELLIPTA ) 100-25 MCG/ACT AEPB, Inhale 1 puff into the lungs daily., Disp: 180 each, Rfl: 1   nebivolol  (BYSTOLIC ) 5 MG tablet, Take 1 tablet  (5 mg total) by mouth daily., Disp: 90 tablet, Rfl: 1   pantoprazole  (PROTONIX ) 40 MG tablet, Take 1 tablet (40 mg total) by mouth daily., Disp: 90 tablet, Rfl: 1   rosuvastatin  (CRESTOR ) 40 MG tablet, Take 1 tablet (40 mg total) by mouth daily., Disp: 90 tablet, Rfl: 1 [2]  Allergies Allergen Reactions   Molds & Smuts Shortness Of Breath   Ambrosia Trifida (Tall Ragweed) Allergy Skin Test    Fish Oil  Hives   Gramineae Pollens  Peanut Oil    Tetracyclines & Related Other (See Comments)    Intolerance - stomach upset

## 2024-09-24 LAB — CBC WITH DIFFERENTIAL/PLATELET
Absolute Lymphocytes: 1177 {cells}/uL (ref 850–3900)
Absolute Monocytes: 529 {cells}/uL (ref 200–950)
Basophils Absolute: 38 {cells}/uL (ref 0–200)
Basophils Relative: 0.7 %
Eosinophils Absolute: 151 {cells}/uL (ref 15–500)
Eosinophils Relative: 2.8 %
HCT: 39.9 % (ref 39.4–51.1)
Hemoglobin: 13 g/dL — ABNORMAL LOW (ref 13.2–17.1)
MCH: 30.6 pg (ref 27.0–33.0)
MCHC: 32.6 g/dL (ref 31.6–35.4)
MCV: 93.9 fL (ref 81.4–101.7)
MPV: 10.2 fL (ref 7.5–12.5)
Monocytes Relative: 9.8 %
Neutro Abs: 3505 {cells}/uL (ref 1500–7800)
Neutrophils Relative %: 64.9 %
Platelets: 281 Thousand/uL (ref 140–400)
RBC: 4.25 Million/uL (ref 4.20–5.80)
RDW: 12.6 % (ref 11.0–15.0)
Total Lymphocyte: 21.8 %
WBC: 5.4 Thousand/uL (ref 3.8–10.8)

## 2024-09-24 LAB — LIPID PANEL
Cholesterol: 116 mg/dL
HDL: 62 mg/dL
LDL Cholesterol (Calc): 36 mg/dL
Non-HDL Cholesterol (Calc): 54 mg/dL
Total CHOL/HDL Ratio: 1.9 (calc)
Triglycerides: 98 mg/dL

## 2024-09-24 LAB — HEMOGLOBIN A1C
Hgb A1c MFr Bld: 5.9 % — ABNORMAL HIGH
Mean Plasma Glucose: 123 mg/dL
eAG (mmol/L): 6.8 mmol/L

## 2024-09-24 LAB — COMPREHENSIVE METABOLIC PANEL WITH GFR
AG Ratio: 2 (calc) (ref 1.0–2.5)
ALT: 37 U/L (ref 9–46)
AST: 39 U/L — ABNORMAL HIGH (ref 10–35)
Albumin: 4.5 g/dL (ref 3.6–5.1)
Alkaline phosphatase (APISO): 42 U/L (ref 35–144)
BUN/Creatinine Ratio: 20 (calc) (ref 6–22)
BUN: 26 mg/dL — ABNORMAL HIGH (ref 7–25)
CO2: 27 mmol/L (ref 20–32)
Calcium: 9.5 mg/dL (ref 8.6–10.3)
Chloride: 104 mmol/L (ref 98–110)
Creat: 1.33 mg/dL (ref 0.70–1.35)
Globulin: 2.3 g/dL (ref 1.9–3.7)
Glucose, Bld: 117 mg/dL — ABNORMAL HIGH (ref 65–99)
Potassium: 3.9 mmol/L (ref 3.5–5.3)
Sodium: 140 mmol/L (ref 135–146)
Total Bilirubin: 0.4 mg/dL (ref 0.2–1.2)
Total Protein: 6.8 g/dL (ref 6.1–8.1)
eGFR: 58 mL/min/1.73m2 — ABNORMAL LOW

## 2024-09-24 LAB — PSA: PSA: 0.85 ng/mL

## 2024-09-26 ENCOUNTER — Ambulatory Visit: Payer: Self-pay | Admitting: Family Medicine

## 2024-10-02 ENCOUNTER — Encounter (INDEPENDENT_AMBULATORY_CARE_PROVIDER_SITE_OTHER): Payer: Self-pay | Admitting: Vascular Surgery

## 2024-10-14 ENCOUNTER — Ambulatory Visit: Admitting: Podiatry

## 2024-10-14 DIAGNOSIS — M19071 Primary osteoarthritis, right ankle and foot: Secondary | ICD-10-CM

## 2024-10-14 DIAGNOSIS — M65972 Unspecified synovitis and tenosynovitis, left ankle and foot: Secondary | ICD-10-CM | POA: Diagnosis not present

## 2024-10-14 NOTE — Progress Notes (Unsigned)
 Right first MTP synovitis injection  Left second MTP synovitis injection

## 2024-10-15 ENCOUNTER — Encounter: Payer: Self-pay | Admitting: Podiatry

## 2024-10-16 ENCOUNTER — Other Ambulatory Visit: Payer: Self-pay | Admitting: Podiatry

## 2024-10-16 MED ORDER — METHYLPREDNISOLONE 4 MG PO TBPK: ORAL_TABLET | ORAL | 0 refills | Status: AC

## 2024-10-16 MED ORDER — MELOXICAM 15 MG PO TABS: 15.0000 mg | ORAL_TABLET | Freq: Every day | ORAL | 0 refills | Status: AC

## 2024-11-28 ENCOUNTER — Encounter: Admitting: Family Medicine

## 2025-03-24 ENCOUNTER — Ambulatory Visit: Admitting: Family Medicine
